# Patient Record
Sex: Female | Born: 1950 | Race: White | Hispanic: No | Smoking: Former smoker
Health system: Southern US, Community
[De-identification: ages and names within clinical notes are randomized; demographics above are authoritative.]

## PROBLEM LIST (undated history)

## (undated) DIAGNOSIS — D649 Anemia, unspecified: Secondary | ICD-10-CM

## (undated) DIAGNOSIS — I10 Essential (primary) hypertension: Secondary | ICD-10-CM

## (undated) DIAGNOSIS — K219 Gastro-esophageal reflux disease without esophagitis: Secondary | ICD-10-CM

## (undated) DIAGNOSIS — I2699 Other pulmonary embolism without acute cor pulmonale: Secondary | ICD-10-CM

## (undated) DIAGNOSIS — M1990 Unspecified osteoarthritis, unspecified site: Secondary | ICD-10-CM

## (undated) DIAGNOSIS — I509 Heart failure, unspecified: Secondary | ICD-10-CM

## (undated) DIAGNOSIS — J449 Chronic obstructive pulmonary disease, unspecified: Secondary | ICD-10-CM

## (undated) HISTORY — PX: EYE SURGERY: SHX253

## (undated) HISTORY — PX: APPENDECTOMY: SHX54

## (undated) HISTORY — PX: CHOLECYSTECTOMY: SHX55

---

## 2015-05-14 ENCOUNTER — Encounter (INDEPENDENT_AMBULATORY_CARE_PROVIDER_SITE_OTHER): Payer: Self-pay | Admitting: Ophthalmology

## 2015-05-31 ENCOUNTER — Encounter (INDEPENDENT_AMBULATORY_CARE_PROVIDER_SITE_OTHER): Payer: Self-pay | Admitting: Ophthalmology

## 2018-06-17 ENCOUNTER — Other Ambulatory Visit (HOSPITAL_COMMUNITY): Payer: Medicare Other

## 2018-06-17 ENCOUNTER — Inpatient Hospital Stay
Admission: AD | Admit: 2018-06-17 | Discharge: 2018-06-28 | Disposition: A | Discharge status: Other Acute Inpatient Hospital | Payer: Medicare Other | Source: Other Acute Inpatient Hospital | Attending: Internal Medicine | Admitting: Internal Medicine

## 2018-06-17 ENCOUNTER — Ambulatory Visit (HOSPITAL_COMMUNITY)
Admission: AD | Admit: 2018-06-17 | Discharge: 2018-06-17 | Disposition: A | Discharge status: Home / Self Care | Payer: Medicare Other | Source: Other Acute Inpatient Hospital | Attending: Internal Medicine | Admitting: Internal Medicine

## 2018-06-17 DIAGNOSIS — J9600 Acute respiratory failure, unspecified whether with hypoxia or hypercapnia: Secondary | ICD-10-CM

## 2018-06-17 DIAGNOSIS — D612 Aplastic anemia due to other external agents: Secondary | ICD-10-CM

## 2018-06-17 DIAGNOSIS — R6521 Severe sepsis with septic shock: Secondary | ICD-10-CM

## 2018-06-17 DIAGNOSIS — I509 Heart failure, unspecified: Secondary | ICD-10-CM

## 2018-06-17 DIAGNOSIS — R55 Syncope and collapse: Secondary | ICD-10-CM

## 2018-06-17 DIAGNOSIS — A419 Sepsis, unspecified organism: Secondary | ICD-10-CM

## 2018-06-17 DIAGNOSIS — Z4659 Encounter for fitting and adjustment of other gastrointestinal appliance and device: Secondary | ICD-10-CM

## 2018-06-17 DIAGNOSIS — J90 Pleural effusion, not elsewhere classified: Secondary | ICD-10-CM

## 2018-06-17 DIAGNOSIS — I480 Paroxysmal atrial fibrillation: Secondary | ICD-10-CM

## 2018-06-17 DIAGNOSIS — J9621 Acute and chronic respiratory failure with hypoxia: Secondary | ICD-10-CM

## 2018-06-17 DIAGNOSIS — J9690 Respiratory failure, unspecified, unspecified whether with hypoxia or hypercapnia: Secondary | ICD-10-CM | POA: Insufficient documentation

## 2018-06-17 DIAGNOSIS — Z9289 Personal history of other medical treatment: Secondary | ICD-10-CM

## 2018-06-17 LAB — BLOOD GAS, ARTERIAL
Acid-Base Excess: 5.1 mmol/L — ABNORMAL HIGH (ref 0.0–2.0)
Bicarbonate: 29.9 mmol/L — ABNORMAL HIGH (ref 20.0–28.0)
FIO2: 0.7
MECHVT: 450 mL
O2 Saturation: 95.6 %
PEEP: 5 cmH2O
Patient temperature: 98.9
RATE: 18 resp/min
pCO2 arterial: 51.3 mmHg — ABNORMAL HIGH (ref 32.0–48.0)
pH, Arterial: 7.385 (ref 7.350–7.450)
pO2, Arterial: 82.7 mmHg — ABNORMAL LOW (ref 83.0–108.0)

## 2018-06-17 LAB — VANCOMYCIN, TROUGH: Vancomycin Tr: 15 ug/mL (ref 15–20)

## 2018-06-18 DIAGNOSIS — D612 Aplastic anemia due to other external agents: Secondary | ICD-10-CM

## 2018-06-18 DIAGNOSIS — J9621 Acute and chronic respiratory failure with hypoxia: Secondary | ICD-10-CM

## 2018-06-18 DIAGNOSIS — A419 Sepsis, unspecified organism: Secondary | ICD-10-CM

## 2018-06-18 DIAGNOSIS — I480 Paroxysmal atrial fibrillation: Secondary | ICD-10-CM

## 2018-06-18 DIAGNOSIS — I509 Heart failure, unspecified: Secondary | ICD-10-CM | POA: Diagnosis not present

## 2018-06-18 DIAGNOSIS — R6521 Severe sepsis with septic shock: Secondary | ICD-10-CM

## 2018-06-18 LAB — CBC WITH DIFFERENTIAL/PLATELET
Abs Immature Granulocytes: 0.12 10*3/uL — ABNORMAL HIGH (ref 0.00–0.07)
Basophils Absolute: 0.1 10*3/uL (ref 0.0–0.1)
Basophils Relative: 1 %
EOS PCT: 2 %
Eosinophils Absolute: 0.3 10*3/uL (ref 0.0–0.5)
HCT: 38.7 % (ref 36.0–46.0)
Hemoglobin: 10.6 g/dL — ABNORMAL LOW (ref 12.0–15.0)
Immature Granulocytes: 1 %
Lymphocytes Relative: 5 %
Lymphs Abs: 0.6 10*3/uL — ABNORMAL LOW (ref 0.7–4.0)
MCH: 24.4 pg — ABNORMAL LOW (ref 26.0–34.0)
MCHC: 27.4 g/dL — AB (ref 30.0–36.0)
MCV: 89 fL (ref 80.0–100.0)
MONO ABS: 1.1 10*3/uL — AB (ref 0.1–1.0)
Monocytes Relative: 9 %
Neutro Abs: 9.8 10*3/uL — ABNORMAL HIGH (ref 1.7–7.7)
Neutrophils Relative %: 82 %
Platelets: 545 10*3/uL — ABNORMAL HIGH (ref 150–400)
RBC: 4.35 MIL/uL (ref 3.87–5.11)
RDW: 19.8 % — ABNORMAL HIGH (ref 11.5–15.5)
WBC: 12 10*3/uL — ABNORMAL HIGH (ref 4.0–10.5)
nRBC: 0 % (ref 0.0–0.2)

## 2018-06-18 LAB — COMPREHENSIVE METABOLIC PANEL
ALT: 11 U/L (ref 0–44)
AST: 12 U/L — ABNORMAL LOW (ref 15–41)
Albumin: 1.4 g/dL — ABNORMAL LOW (ref 3.5–5.0)
Alkaline Phosphatase: 277 U/L — ABNORMAL HIGH (ref 38–126)
Anion gap: 9 (ref 5–15)
BUN: 34 mg/dL — ABNORMAL HIGH (ref 8–23)
CO2: 29 mmol/L (ref 22–32)
CREATININE: 1.02 mg/dL — AB (ref 0.44–1.00)
Calcium: 8.7 mg/dL — ABNORMAL LOW (ref 8.9–10.3)
Chloride: 105 mmol/L (ref 98–111)
GFR calc Af Amer: >60 mL/min (ref >60)
GFR calc non Af Amer: 56 mL/min — ABNORMAL LOW (ref >60)
Glucose, Bld: 106 mg/dL — ABNORMAL HIGH (ref 70–99)
Potassium: 4.5 mmol/L (ref 3.5–5.1)
Sodium: 143 mmol/L (ref 135–145)
Total Bilirubin: 0.2 mg/dL — ABNORMAL LOW (ref 0.3–1.2)
Total Protein: 6.3 g/dL — ABNORMAL LOW (ref 6.5–8.1)

## 2018-06-18 LAB — HEMOGLOBIN A1C
Hgb A1c MFr Bld: 5.3 % (ref 4.8–5.6)
Mean Plasma Glucose: 105.41 mg/dL

## 2018-06-18 NOTE — Consult Note (Signed)
[facility]  Date of Service: 06/18/2018  PULMONARY CRITICAL CARE CONSULT   Kelsey Jensen  ZOX:[id number]  DOB: 01/14/1951   DOA: 06/17/2018  Referring Physician: Carron CurieAli Hijazi, MD  HPI: Kelsey Jensen is a 68 y.o. female seen for follow up of Acute on Chronic Respiratory Failure.  Patient is endotracheally intubated at the time that she was seen she is on propofol and fentanyl for sedation.  This unfortunate female has a history of hypertension congestive heart failure chronic aplastic anemia requiring multiple transfusions [facility] [place] [facility] and it was felt to be secondary to pneumonitis pneumonia.  Patient had been having some leg cramping and weakness along with shortness of breath.  Chest x-ray was done she was found to have a hemoglobin of 4.8 she was given 3 units of packed red cells and had worsening of her symptoms subsequently she was intubated.  On chest x-ray she was found to have bilateral pneumonia and also had a urinary tract infection with E. coli.  Patient was started on vancomycin as well as cefepime for broad-spectrum coverage.  The patient's subsequent hospital course is 1 of acute kidney injury seen by nephrology conservative management with some improvement.  Patient also have a drop in her blood pressure requiring Neo-Synephrine and that was felt to be secondary to the sepsis.  Right now as mentioned she is orally intubated and is on sedation  Past medical history Aplastic anemia Parental infusion dependent Iron deficiency Bronchitis Congestive heart failure Diverticulosis Hypertension Hypokalemia Morbid obesity Peptic ulcer disease GI bleed history  Surgical history: She has had left hip replaced.  Family history: Diabetes hypertension cataracts cancer glaucoma  Allergies Sulfa  Family history: Former smoker social alcohol use no drug  abuse  Medications: Reviewed on Rounds  Physical Exam:  Vitals: Temperature 99.0 pulse 89 respiratory 18 blood pressure 121/78 saturations 95%  Ventilator Settings mode of ventilation assist control FiO2 60% tidal volume 472 PEEP 5  . General: Comfortable at this time . Eyes: Grossly normal lids, irises & conjunctiva . ENT: grossly tongue is normal . Neck: no obvious mass . Cardiovascular: S1-S2 normal no gallop or rub is noted . Respiratory: Coarse breath sounds are noted bilaterally . Abdomen: Soft and nontender . Skin: no rash seen on limited exam . Musculoskeletal: not rigid . Psychiatric:unable to assess . Neurologic: no seizure no involuntary movements         Labs on Admission:  Basic Metabolic Panel: Recent Labs  Lab 06/18/18 0643  NA 143  K 4.5  CL 105  CO2 29  GLUCOSE 106*  BUN 34*  CREATININE 1.02*  CALCIUM 8.7*    Recent Labs  Lab 06/17/18 1630  PHART 7.385  PCO2ART 51.3*  PO2ART 82.7*  HCO3 29.9*  O2SAT 95.6    Liver Function Tests: Recent Labs  Lab 06/18/18 0643  AST 12*  ALT 11  ALKPHOS 277*  BILITOT 0.2*  PROT 6.3*  ALBUMIN 1.4*   No results for input(s): LIPASE, AMYLASE in the last 168 hours. No results for input(s): AMMONIA in the last 168 hours.  CBC: Recent Labs  Lab 06/18/18 0643  WBC 12.0*  NEUTROABS 9.8*  HGB 10.6*  HCT 38.7  MCV 89.0  PLT 545*    Cardiac Enzymes: No results for input(s): CKTOTAL, CKMB, CKMBINDEX, TROPONINI in the last 168 hours.  BNP (last 3 results) No results for input(s): BNP in the last 8760 hours.  ProBNP (last 3 results) No results for input(s): PROBNP in the last 8760 hours.   Radiological Exams on Admission: Dg Chest Port 1 View  Result Date: 06/17/2018 CLINICAL DATA:  ETT and NG tube placement EXAM: PORTABLE CHEST 1 VIEW COMPARISON:  None. FINDINGS: Endotracheal tube tip is about 5 cm superior to carina. Esophageal tube tip below diaphragm but non included. Left upper extremity  catheter tip over the SVC. Cardiomegaly with vascular congestion and mild pulmonary edema. Small moderate pleural effusions. Dense bibasilar airspace disease. No pneumothorax. IMPRESSION: 1. Endotracheal tube tip about 5 cm superior to carina 2. Cardiomegaly with vascular congestion, mild pulmonary edema, and small moderate pleural effusion. 3. Dense bibasilar airspace disease may reflect atelectasis or pneumonia Electronically Signed   By: Jasmine PangKim  Fujinaga M.D.   On: 06/17/2018 19:27   Dg Abd Portable 1v  Result Date: 06/17/2018 CLINICAL DATA:  NG tube EXAM: PORTABLE ABDOMEN - 1 VIEW COMPARISON:  None. FINDINGS: Esophageal tube tip overlies the gastroduodenal region. Overall gas pattern nonobstructed with mild to moderate stool. Scoliosis of the spine. Status post left hip replacement. IMPRESSION: Esophageal tube tip overlies the gastroduodenal junction Electronically Signed   By: Jasmine PangKim  Fujinaga M.D.   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 Acute on chronic respiratory failure with hypoxia at this time patient is on full vent support on assist control mode.  She is still requiring significant elevated FiO2 60%.  The patient's last chest x-ray did show cardiomegaly with some vascular congestion pulmonary edema and small moderate pleural effusion suggestive more of congestive heart failure not an acute infection at this point.  I would try to diuresis tolerated BUN/creatinine are adequate at this time. 2. Sepsis patient was on vancomycin as well as cefepime.  This will be continued.  As far as her blood pressure is concerned she had been on Neo-Synephrine this is being titrated.  Need to monitor her per critical care protocol. 3. Congestive heart  failure chest x-ray actually revealing more consistent with CHF she is been on Lasix and she is getting IV 40 mg which should be continued. 4. Chronic anemia need to monitor her hemoglobin closely avoid excessive venipunctures. 5. Paroxysmal atrial fibrillation rate controlled at this time we will continue with metoprolol as ordered and is off anticoagulation secondary to her hemoglobin and history of GI bleed as per the request of POA.  I have personally seen and evaluated the patient, evaluated laboratory and imaging results, formulated the assessment and plan and placed orders.  Patient is critically ill in danger of cardiac arrest and death she has multiorgan system involvement and needs close monitoring.  In addition she has a unstable airway and is in intubated orally The Patient requires high complexity decision making for assessment and support.  Case was discussed on Rounds with the Respiratory Therapy Staff Time Spent 70minutes  Yevonne PaxSaadat A , MD [facility] Pulmonary Critical Care Medicine Sleep Medicine

## 2018-06-19 DIAGNOSIS — I509 Heart failure, unspecified: Secondary | ICD-10-CM | POA: Diagnosis not present

## 2018-06-19 DIAGNOSIS — I480 Paroxysmal atrial fibrillation: Secondary | ICD-10-CM | POA: Diagnosis not present

## 2018-06-19 DIAGNOSIS — D612 Aplastic anemia due to other external agents: Secondary | ICD-10-CM | POA: Diagnosis not present

## 2018-06-19 DIAGNOSIS — J9621 Acute and chronic respiratory failure with hypoxia: Secondary | ICD-10-CM | POA: Diagnosis not present

## 2018-06-19 NOTE — Progress Notes (Signed)
[facility]   PULMONARY CRITICAL CARE SERVICE  PROGRESS NOTE  Date of Service: 06/19/2018  Kelsey AbbeBrenda Jensen  JYN:[id number]  DOB: February 04, 1951   DOA: 06/17/2018  Referring Physician: Carron CurieAli Hijazi, MD  HPI: Kelsey Jensen is a 68 y.o. female seen for follow up of Acute on Chronic Respiratory Failure.  Patient is critically ill on multiple drips right now requiring sedation fentanyl propofol.  Patient also has been requiring Neo-Synephrine for blood pressure remains on the ventilator right now is on 60% oxygen with a PEEP of 5.  Endotracheal tube is in place  Medications: Reviewed on Rounds  Physical Exam:  Vitals: Temperature 97.8 pulse 118 respiratory 26 blood pressure 123/68 saturations 97%  Ventilator Settings mode of ventilation assist control FiO2 60% tidal volume 473 PEEP 5  . General: Comfortable at this time . Eyes: Grossly normal lids, irises & conjunctiva . ENT: grossly tongue is normal . Neck: no obvious mass . Cardiovascular: S1 S2 normal no gallop . Respiratory: Coarse breath sounds with a few rhonchi . Abdomen: soft . Skin: no rash seen on limited exam . Musculoskeletal: not rigid . Psychiatric:unable to assess . Neurologic: no seizure no involuntary movements         Lab Data:   Basic Metabolic Panel: Recent Labs  Lab 06/18/18 0643  NA 143  K 4.5  CL 105  CO2 29  GLUCOSE 106*  BUN 34*  CREATININE 1.02*  CALCIUM 8.7*    ABG: Recent Labs  Lab 06/17/18 1630  PHART 7.385  PCO2ART 51.3*  PO2ART 82.7*  HCO3 29.9*  O2SAT 95.6    Liver Function Tests: Recent Labs  Lab 06/18/18 0643  AST 12*  ALT 11  ALKPHOS 277*  BILITOT 0.2*  PROT 6.3*  ALBUMIN 1.4*   No results for input(s): LIPASE, AMYLASE in the last 168 hours. No results for input(s): AMMONIA in the last 168 hours.  CBC: Recent Labs  Lab 06/18/18 0643  WBC 12.0*  NEUTROABS 9.8*  HGB 10.6*  HCT 38.7  MCV 89.0  PLT 545*     Cardiac Enzymes: No results for input(s): CKTOTAL, CKMB, CKMBINDEX, TROPONINI in the last 168 hours.  BNP (last 3 results) No results for input(s): BNP in the last 8760 hours.  ProBNP (last 3 results) No results for input(s): PROBNP in the last 8760 hours.  Radiological Exams: Dg Chest Port 1 View  Result Date: 06/17/2018 CLINICAL DATA:  ETT and NG tube placement EXAM: PORTABLE CHEST 1 VIEW COMPARISON:  None. FINDINGS: Endotracheal tube tip is about 5 cm superior to carina. Esophageal tube tip below diaphragm but non included. Left upper extremity catheter tip over the SVC. Cardiomegaly with vascular congestion and mild pulmonary edema. Small moderate pleural effusions. Dense bibasilar airspace disease. No pneumothorax. IMPRESSION: 1. Endotracheal tube tip about 5 cm superior to carina 2. Cardiomegaly with vascular congestion, mild pulmonary edema, and small moderate pleural effusion. 3. Dense bibasilar airspace disease may reflect atelectasis or pneumonia Electronically Signed   By: Kelsey PangKim  Jensen M.D.   On: 06/17/2018 19:27   Dg Abd Portable 1v  Result Date: 06/17/2018 CLINICAL DATA:  NG tube EXAM: PORTABLE ABDOMEN - 1 VIEW COMPARISON:  None. FINDINGS: Esophageal tube tip overlies the gastroduodenal region. Overall gas pattern nonobstructed with mild to moderate stool. Scoliosis of the spine. Status post left hip replacement. IMPRESSION: Esophageal tube tip overlies the gastroduodenal junction Electronically Signed   By: Kelsey PangKim  Jensen M.D.   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 Acute on chronic respiratory failure with hypoxia patient will be continued on full support on assist control mode.  Right now patient is on 60%  FiO2 chest x-ray was reviewed and it showed cardiomegaly with vascular congestion and congestion patient has significant cardiomegaly noted also. 2. Severe sepsis with shock patient is requiring pressors was on Neo-Synephrine will titrate down as tolerated. 3. Chronic congestive heart failure chest x-ray shows severe dilatation of the cardiac silhouette.  Last echocardiogram that I am able to locate was in 2014 I would recommend doing a follow-up echocardiogram to reassess this may have an effect on the patient's prognosis 4. Aplastic anemia we will continue with supportive care has been requiring transfusions 5. Chronic atrial fibrillation paroxysmal right now rate is controlled we will continue to follow   I have personally seen and evaluated the patient, evaluated laboratory and imaging results, formulated the assessment and plan and placed orders.  Patient is critically ill in danger of cardiac arrest and death has a high risk airway with the ET tube and requiring ongoing sedation time 35 minutes critical care The Patient requires high complexity decision making for assessment and support.  Case was discussed on Rounds with the Respiratory Therapy Staff  Yevonne Pax, MD [facility] Pulmonary Critical Care Medicine Sleep Medicine

## 2018-06-20 DIAGNOSIS — I509 Heart failure, unspecified: Secondary | ICD-10-CM | POA: Diagnosis not present

## 2018-06-20 DIAGNOSIS — D612 Aplastic anemia due to other external agents: Secondary | ICD-10-CM | POA: Diagnosis not present

## 2018-06-20 DIAGNOSIS — J9621 Acute and chronic respiratory failure with hypoxia: Secondary | ICD-10-CM | POA: Diagnosis not present

## 2018-06-20 DIAGNOSIS — I480 Paroxysmal atrial fibrillation: Secondary | ICD-10-CM | POA: Diagnosis not present

## 2018-06-20 LAB — VANCOMYCIN, TROUGH: Vancomycin Tr: 12 ug/mL — ABNORMAL LOW (ref 15–20)

## 2018-06-20 NOTE — Progress Notes (Signed)
Pulmonary Critical Care Medicine [facility] [place]   PULMONARY CRITICAL CARE SERVICE  PROGRESS NOTE  Date of Service: 06/20/2018  Kelsey Jensen  [id number]  DOB: 02-21-1951   DOA: 06/17/2018  Referring Physician: Carron Curie, MD  HPI: Kelsey Jensen is a 68 y.o. female seen for follow up of Acute on Chronic Respiratory Failure.  Patient remains critically ill she is orally intubated is been on full support on assist control mode currently is requiring 60% oxygen also is requiring sedation has been on Versed which has been adequate to achieve control.  Medications: Reviewed on Rounds  Physical Exam:  Vitals: Temperature 98.8 pulse 96 respiratory 22 blood pressure 127/74 saturations 95%  Ventilator Settings mode ventilation assist control FiO2 60% tidal volume 547 PEEP 6  . General: Comfortable at this time . Eyes: Grossly normal lids, irises & conjunctiva . ENT: grossly tongue is normal . Neck: no obvious mass . Cardiovascular: S1 S2 normal no gallop . Respiratory: Coarse breath sounds are noted bilaterally scattered rhonchi . Abdomen: soft . Skin: no rash seen on limited exam . Musculoskeletal: not rigid . Psychiatric:unable to assess . Neurologic: no seizure no involuntary movements         Lab Data:   Basic Metabolic Panel: Recent Labs  Lab 06/18/18 0643  NA 143  K 4.5  CL 105  CO2 29  GLUCOSE 106*  BUN 34*  CREATININE 1.02*  CALCIUM 8.7*    ABG: Recent Labs  Lab 06/17/18 1630  PHART 7.385  PCO2ART 51.3*  PO2ART 82.7*  HCO3 29.9*  O2SAT 95.6    Liver Function Tests: Recent Labs  Lab 06/18/18 0643  AST 12*  ALT 11  ALKPHOS 277*  BILITOT 0.2*  PROT 6.3*  ALBUMIN 1.4*   No results for input(s): LIPASE, AMYLASE in the last 168 hours. No results for input(s): AMMONIA in the last 168 hours.  CBC: Recent Labs  Lab 06/18/18 0643  WBC 12.0*  NEUTROABS 9.8*  HGB 10.6*  HCT 38.7  MCV 89.0  PLT 545*    Cardiac Enzymes: No  results for input(s): CKTOTAL, CKMB, CKMBINDEX, TROPONINI in the last 168 hours.  BNP (last 3 results) No results for input(s): BNP in the last 8760 hours.  ProBNP (last 3 results) No results for input(s): PROBNP in the last 8760 hours.  Radiological Exams: No results found.  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 Acute on chronic respiratory failure with hypoxia patient is critically ill remains on full vent support still requiring high FiO2 of 60%.  I would suggest getting a follow-up ABG to assess oxygen needs to titrate down if possible.  Right now patient is getting a tidal volume of 547 PEEP 6 and is comfortable on the settings.  The ET tube is in good place 2. Severe sepsis with shock hemodynamically stable at this time we will continue with present management blood pressure looks adequate. 3. Chronic congestive heart failure diuretics as tolerated continue with supportive care with patient's labs will be followed.  Echocardiogram follow-up was suggested 4. Aplastic anemia monitor hemoglobin has been requiring transfusions 5. Chronic atrial fibrillation rate is controlled   I have personally seen and evaluated the patient, evaluated laboratory and imaging results, formulated the assessment and plan and  placed orders.  Patient is critically ill in danger of cardiac arrest and death time 35 minutes.  Patient is orally intubated and has a high risk airway case discussed on rounds with treatment team and primary care team The Patient requires high complexity decision making for assessment and support.  Case was discussed on Rounds with the Respiratory Therapy Staff  Yevonne Pax, MD [facility] Pulmonary Critical Care Medicine Sleep Medicine

## 2018-06-21 DIAGNOSIS — Z9289 Personal history of other medical treatment: Secondary | ICD-10-CM

## 2018-06-21 DIAGNOSIS — D612 Aplastic anemia due to other external agents: Secondary | ICD-10-CM | POA: Diagnosis not present

## 2018-06-21 DIAGNOSIS — I480 Paroxysmal atrial fibrillation: Secondary | ICD-10-CM | POA: Diagnosis not present

## 2018-06-21 DIAGNOSIS — J9621 Acute and chronic respiratory failure with hypoxia: Secondary | ICD-10-CM | POA: Diagnosis not present

## 2018-06-21 DIAGNOSIS — I509 Heart failure, unspecified: Secondary | ICD-10-CM | POA: Diagnosis not present

## 2018-06-21 LAB — CBC
HCT: 39 % (ref 36.0–46.0)
Hemoglobin: 10.8 g/dL — ABNORMAL LOW (ref 12.0–15.0)
MCH: 24.7 pg — ABNORMAL LOW (ref 26.0–34.0)
MCHC: 27.7 g/dL — ABNORMAL LOW (ref 30.0–36.0)
MCV: 89 fL (ref 80.0–100.0)
Platelets: 710 10*3/uL — ABNORMAL HIGH (ref 150–400)
RBC: 4.38 MIL/uL (ref 3.87–5.11)
RDW: 19.9 % — ABNORMAL HIGH (ref 11.5–15.5)
WBC: 12.2 10*3/uL — ABNORMAL HIGH (ref 4.0–10.5)
nRBC: 0 % (ref 0.0–0.2)

## 2018-06-21 LAB — BASIC METABOLIC PANEL
Anion gap: 11 (ref 5–15)
BUN: 32 mg/dL — ABNORMAL HIGH (ref 8–23)
CO2: 28 mmol/L (ref 22–32)
Calcium: 8.8 mg/dL — ABNORMAL LOW (ref 8.9–10.3)
Chloride: 103 mmol/L (ref 98–111)
Creatinine, Ser: 0.81 mg/dL (ref 0.44–1.00)
GFR calc Af Amer: >60 mL/min (ref >60)
GFR calc non Af Amer: >60 mL/min (ref >60)
Glucose, Bld: 136 mg/dL — ABNORMAL HIGH (ref 70–99)
Potassium: 4.3 mmol/L (ref 3.5–5.1)
Sodium: 142 mmol/L (ref 135–145)

## 2018-06-21 LAB — MAGNESIUM: Magnesium: 2.3 mg/dL (ref 1.7–2.4)

## 2018-06-21 LAB — URIC ACID: Uric Acid, Serum: 2.8 mg/dL (ref 2.5–7.1)

## 2018-06-21 LAB — CK: Total CK: 16 U/L — ABNORMAL LOW (ref 38–234)

## 2018-06-21 NOTE — Progress Notes (Signed)
Pulmonary Critical Care Medicine [facility] [place]   PULMONARY CRITICAL CARE SERVICE  PROGRESS NOTE  Date of Service: 06/21/2018  Kelsey Jensen  [id number]  DOB: 08/25/1950   DOA: 06/17/2018  Referring Physician: Carron Curie, MD  HPI: Kelsey Jensen is a 68 y.o. female seen for follow up of Acute on Chronic Respiratory Failure.  Patient remains on full support right now is on assist control mode with 60% FiO2 the patient is orally intubated and also is requiring sedation in the form of fentanyl and Versed  Medications: Reviewed on Rounds  Physical Exam:  Vitals: Temperature 98.2 pulse 99 respiratory rate 20 blood pressure 112/56 saturations 96%  Ventilator Settings mode ventilation assist control FiO2 60% PEEP 8 tidal volume 450  . General: Comfortable at this time . Eyes: Grossly normal lids, irises & conjunctiva . ENT: grossly tongue is normal . Neck: no obvious mass . Cardiovascular: S1 S2 normal no gallop . Respiratory: No rhonchi or rales are noted at this time . Abdomen: soft . Skin: no rash seen on limited exam . Musculoskeletal: not rigid . Psychiatric:unable to assess . Neurologic: no seizure no involuntary movements         Lab Data:   Basic Metabolic Panel: Recent Labs  Lab 06/18/18 0643 06/21/18 1231  NA 143 142  K 4.5 4.3  CL 105 103  CO2 29 28  GLUCOSE 106* 136*  BUN 34* 32*  CREATININE 1.02* 0.81  CALCIUM 8.7* 8.8*  MG  --  2.3    ABG: Recent Labs  Lab 06/17/18 1630  PHART 7.385  PCO2ART 51.3*  PO2ART 82.7*  HCO3 29.9*  O2SAT 95.6    Liver Function Tests: Recent Labs  Lab 06/18/18 0643  AST 12*  ALT 11  ALKPHOS 277*  BILITOT 0.2*  PROT 6.3*  ALBUMIN 1.4*   No results for input(s): LIPASE, AMYLASE in the last 168 hours. No results for input(s): AMMONIA in the last 168 hours.  CBC: Recent Labs  Lab 06/18/18 0643 06/21/18 1231  WBC 12.0* 12.2*  NEUTROABS 9.8*  --   HGB 10.6* 10.8*  HCT 38.7 39.0  MCV 89.0  89.0  PLT 545* 710*    Cardiac Enzymes: Recent Labs  Lab 06/21/18 1231  CKTOTAL 16*    BNP (last 3 results) No results for input(s): BNP in the last 8760 hours.  ProBNP (last 3 results) No results for input(s): PROBNP in the last 8760 hours.  Radiological Exams: No results found.  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 Acute on chronic respiratory failure with hypoxia patient is orally intubated tube is in good place patient is not tolerating the RSB I has required ongoing sedation for airway protection patient will have an ENT consultation for surgery for tracheostomy. 2. Severe sepsis with shock hemodynamically doing better continue to monitor pressures 3. Chronic congestive heart failure follow-up x-ray as needed 4. Aplastic anemia transfuse as needed hemoglobin seems to be stable 5. Paroxysmal atrial fibrillation rate is controlled we will continue with present management   I have personally seen and evaluated the patient, evaluated laboratory and imaging results, formulated the assessment and plan and placed orders.  Time 35 minutes patient is critically ill in danger of cardiac arrest and death.  Patient  has high risk airway orally intubated is on sedation with Versed drip and fentanyl drip The Patient requires high complexity decision making for assessment and support.  Case was discussed on Rounds with the Respiratory Therapy Staff  Yevonne Pax, MD [facility] Pulmonary Critical Care Medicine Sleep Medicine

## 2018-06-22 ENCOUNTER — Other Ambulatory Visit (HOSPITAL_COMMUNITY): Payer: Medicare Other

## 2018-06-22 DIAGNOSIS — D612 Aplastic anemia due to other external agents: Secondary | ICD-10-CM | POA: Diagnosis not present

## 2018-06-22 DIAGNOSIS — I509 Heart failure, unspecified: Secondary | ICD-10-CM | POA: Diagnosis not present

## 2018-06-22 DIAGNOSIS — J9621 Acute and chronic respiratory failure with hypoxia: Secondary | ICD-10-CM | POA: Diagnosis not present

## 2018-06-22 DIAGNOSIS — I480 Paroxysmal atrial fibrillation: Secondary | ICD-10-CM | POA: Diagnosis not present

## 2018-06-22 LAB — BLOOD GAS, ARTERIAL
Acid-Base Excess: 7.8 mmol/L — ABNORMAL HIGH (ref 0.0–2.0)
Bicarbonate: 31.6 mmol/L — ABNORMAL HIGH (ref 20.0–28.0)
FIO2: 100
O2 SAT: 89.6 %
PEEP: 10 cmH2O
Patient temperature: 98.6
RATE: 18 resp/min
VT: 450 mL
pCO2 arterial: 43.1 mmHg (ref 32.0–48.0)
pH, Arterial: 7.479 — ABNORMAL HIGH (ref 7.350–7.450)
pO2, Arterial: 57.6 mmHg — ABNORMAL LOW (ref 83.0–108.0)

## 2018-06-22 NOTE — Progress Notes (Signed)
Pulmonary Critical Care Medicine [facility] [place]   PULMONARY CRITICAL CARE SERVICE  PROGRESS NOTE  Date of Service: 06/22/2018  Kelsey Jensen  WIO:[id number]  DOB: May 29, 1951   DOA: 06/17/2018  Referring Physician: Carron Curie, MD  HPI: Kelsey Jensen is a 68 y.o. female seen for follow up of Acute on Chronic Respiratory Failure.  Patient had an acute decline in status she is have to be increased to 100% FiO2 PEEP was increased to 10.  Chest x-ray was done which basically shows a white out of the left side.  She is orally intubated on the ventilator.  Medications: Reviewed on Rounds  Physical Exam:  Vitals: Temperature 97.0 pulse 118 respiratory rate 26 blood pressure 141/73 saturations 96%  Ventilator Settings mode ventilation assist control FiO2 100% tidal volume 480 PEEP of 10  . General: Comfortable at this time . Eyes: Grossly normal lids, irises & conjunctiva . ENT: grossly tongue is normal . Neck: no obvious mass . Cardiovascular: S1 S2 normal no gallop . Respiratory: Coarse breath sounds diminished on the left . Abdomen: soft . Skin: no rash seen on limited exam . Musculoskeletal: not rigid . Psychiatric:unable to assess . Neurologic: no seizure no involuntary movements         Lab Data:   Basic Metabolic Panel: Recent Labs  Lab 06/18/18 0643 06/21/18 1231  NA 143 142  K 4.5 4.3  CL 105 103  CO2 29 28  GLUCOSE 106* 136*  BUN 34* 32*  CREATININE 1.02* 0.81  CALCIUM 8.7* 8.8*  MG  --  2.3    ABG: Recent Labs  Lab 06/17/18 1630 06/22/18 0845  PHART 7.385 7.479*  PCO2ART 51.3* 43.1  PO2ART 82.7* 57.6*  HCO3 29.9* 31.6*  O2SAT 95.6 89.6    Liver Function Tests: Recent Labs  Lab 06/18/18 0643  AST 12*  ALT 11  ALKPHOS 277*  BILITOT 0.2*  PROT 6.3*  ALBUMIN 1.4*   No results for input(s): LIPASE, AMYLASE in the last 168 hours. No results for input(s): AMMONIA in the last 168 hours.  CBC: Recent Labs  Lab 06/18/18 0643  06/21/18 1231  WBC 12.0* 12.2*  NEUTROABS 9.8*  --   HGB 10.6* 10.8*  HCT 38.7 39.0  MCV 89.0 89.0  PLT 545* 710*    Cardiac Enzymes: Recent Labs  Lab 06/21/18 1231  CKTOTAL 16*    BNP (last 3 results) No results for input(s): BNP in the last 8760 hours.  ProBNP (last 3 results) No results for input(s): PROBNP in the last 8760 hours.  Radiological Exams: Dg Chest Port 1 View  Result Date: 06/22/2018 CLINICAL DATA:  Pleural effusion. Acute on chronic respiratory failure. EXAM: PORTABLE CHEST 1 VIEW COMPARISON:  06/17/2018 FINDINGS: Endotracheal tube is in good position. PICC tip is in good position, unchanged. Feeding tube tip is below the diaphragm. The patient has developed complete opacification of the left hemithorax with some air bronchograms, probably representing a combination of lung consolidation and effusion. Hazy density at the right lung base probably represents a combination of infiltrate and effusion. The aeration at the right base has slightly improved. Pulmonary vascularity is normal. No acute bone abnormality. IMPRESSION: 1. Interval complete opacification of the left hemithorax probably due to a combination of lung consolidation and effusion. 2. Slightly improved aeration at the right lung base with probable atelectasis and a small right effusion. Electronically Signed   By: Francene Boyers M.D.   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 Acute on chronic respiratory failure with hypoxia patient had acute decline in her oxygen requirements with FiO2 of 100% now.  Chest x-ray shows whiteout of the left side likely representing atelectasis based on the findings of the chest film.  Spoke with respiratory therapy  during rounds we are going to try bagging and lavaging her.  Also will try Mucomyst and also try chest PT if this is able to expand the lungs then will continue with Mucomyst for at least 3 days.  If her lung is still dilated she might need to have a bronchoscopy for further airway evaluation. 2. Severe sepsis with shock resolved hemodynamically stable. 3. Chronic congestive heart failure she is on diuretics 4. Aplastic anemia at baseline 5. Chronic atrial fibrillation rate is controlled at this time we will continue to monitor   I have personally seen and evaluated the patient, evaluated laboratory and imaging results, formulated the assessment and plan and placed orders.  Patient is critically ill in danger of cardiac arrest and death she has a high risk airway orally intubated time 35 minutes critical care The Patient requires high complexity decision making for assessment and support.  Case was discussed on Rounds with the Respiratory Therapy Staff  Yevonne Pax, MD [facility] Pulmonary Critical Care Medicine Sleep Medicine

## 2018-06-23 ENCOUNTER — Other Ambulatory Visit (HOSPITAL_COMMUNITY): Payer: Medicare Other

## 2018-06-23 DIAGNOSIS — J9811 Atelectasis: Secondary | ICD-10-CM | POA: Diagnosis not present

## 2018-06-23 DIAGNOSIS — A419 Sepsis, unspecified organism: Secondary | ICD-10-CM

## 2018-06-23 DIAGNOSIS — R6521 Severe sepsis with septic shock: Secondary | ICD-10-CM

## 2018-06-23 DIAGNOSIS — D612 Aplastic anemia due to other external agents: Secondary | ICD-10-CM

## 2018-06-23 DIAGNOSIS — I509 Heart failure, unspecified: Secondary | ICD-10-CM | POA: Diagnosis not present

## 2018-06-23 DIAGNOSIS — I480 Paroxysmal atrial fibrillation: Secondary | ICD-10-CM

## 2018-06-23 DIAGNOSIS — J9621 Acute and chronic respiratory failure with hypoxia: Secondary | ICD-10-CM

## 2018-06-23 LAB — CBC
HCT: 33.5 % — ABNORMAL LOW (ref 36.0–46.0)
Hemoglobin: 9.6 g/dL — ABNORMAL LOW (ref 12.0–15.0)
MCH: 25.2 pg — ABNORMAL LOW (ref 26.0–34.0)
MCHC: 28.7 g/dL — ABNORMAL LOW (ref 30.0–36.0)
MCV: 87.9 fL (ref 80.0–100.0)
NRBC: 0 % (ref 0.0–0.2)
Platelets: 781 10*3/uL — ABNORMAL HIGH (ref 150–400)
RBC: 3.81 MIL/uL — ABNORMAL LOW (ref 3.87–5.11)
RDW: 20 % — ABNORMAL HIGH (ref 11.5–15.5)
WBC: 19.8 10*3/uL — AB (ref 4.0–10.5)

## 2018-06-23 LAB — BASIC METABOLIC PANEL
Anion gap: 10 (ref 5–15)
BUN: 43 mg/dL — ABNORMAL HIGH (ref 8–23)
CO2: 30 mmol/L (ref 22–32)
Calcium: 8.6 mg/dL — ABNORMAL LOW (ref 8.9–10.3)
Chloride: 101 mmol/L (ref 98–111)
Creatinine, Ser: 0.93 mg/dL (ref 0.44–1.00)
GFR calc Af Amer: >60 mL/min (ref >60)
GFR calc non Af Amer: >60 mL/min (ref >60)
Glucose, Bld: 170 mg/dL — ABNORMAL HIGH (ref 70–99)
Potassium: 4 mmol/L (ref 3.5–5.1)
Sodium: 141 mmol/L (ref 135–145)

## 2018-06-23 NOTE — Progress Notes (Signed)
[facility] [place]   PULMONARY CRITICAL CARE SERVICE  PROGRESS NOTE  Date of Service: 06/23/2018  Kelsey Jensen  MWU:[id number]  DOB: 04-11-1951   DOA: 06/17/2018  Referring Physician: Carron CurieAli Hijazi, MD  HPI: Kelsey Jensen is a 68 y.o. female seen for follow up of Acute on Chronic Respiratory Failure.  She continues to be on high FiO2 requirements has been on 55% FiO2 which is an improvement from yesterday she had been on 100%.  Chest x-ray shows some improvement of aeration also.  Still has lower lobe collapse and she will need further evaluation of her airway.  She remains orally intubated  Medications: Reviewed on Rounds  Physical Exam:  Vitals: Temperature 97.4 pulse 95 respiratory 21 blood pressure 125/88 saturations 96%  Ventilator Settings mode ventilation assist control FiO2 55% tidal volume 470 PEEP 10  . General: Comfortable at this time . Eyes: Grossly normal lids, irises & conjunctiva . ENT: grossly tongue is normal . Neck: no obvious mass . Cardiovascular: S1 S2 normal no gallop . Respiratory: Coarse rhonchi expansion is equal . Abdomen: soft . Skin: no rash seen on limited exam . Musculoskeletal: not rigid . Psychiatric:unable to assess . Neurologic: no seizure no involuntary movements         Lab Data:   Basic Metabolic Panel: Recent Labs  Lab 06/18/18 0643 06/21/18 1231 06/23/18 0712  NA 143 142 141  K 4.5 4.3 4.0  CL 105 103 101  CO2 29 28 30   GLUCOSE 106* 136* 170*  BUN 34* 32* 43*  CREATININE 1.02* 0.81 0.93  CALCIUM 8.7* 8.8* 8.6*  MG  --  2.3  --     ABG: Recent Labs  Lab 06/17/18 1630 06/22/18 0845  PHART 7.385 7.479*  PCO2ART 51.3* 43.1  PO2ART 82.7* 57.6*  HCO3 29.9* 31.6*  O2SAT 95.6 89.6    Liver Function Tests: Recent Labs  Lab 06/18/18 0643  AST 12*  ALT 11  ALKPHOS 277*  BILITOT 0.2*  PROT 6.3*  ALBUMIN 1.4*   No results for input(s): LIPASE, AMYLASE in the last 168  hours. No results for input(s): AMMONIA in the last 168 hours.  CBC: Recent Labs  Lab 06/18/18 0643 06/21/18 1231 06/23/18 0712  WBC 12.0* 12.2* 19.8*  NEUTROABS 9.8*  --   --   HGB 10.6* 10.8* 9.6*  HCT 38.7 39.0 33.5*  MCV 89.0 89.0 87.9  PLT 545* 710* 781*    Cardiac Enzymes: Recent Labs  Lab 06/21/18 1231  CKTOTAL 16*    BNP (last 3 results) No results for input(s): BNP in the last 8760 hours.  ProBNP (last 3 results) No results for input(s): PROBNP in the last 8760 hours.  Radiological Exams: Dg Chest Port 1 View  Result Date: 06/23/2018 CLINICAL DATA:  Hypoxia EXAM: PORTABLE CHEST 1 VIEW COMPARISON:  April 23, 2019 FINDINGS: Endotracheal tube tip is 4.6 cm above the carina. Feeding tube tip is below the diaphragm. Central catheter tip is in the superior vena cava near the cavoatrial junction. No pneumothorax. Large pleural effusion noted 1 day prior is no longer evident. There are small pleural effusions bilaterally. There is airspace consolidation in the lower lobes bilaterally as well as to a lesser extent in each upper lobe. There is cardiomegaly with pulmonary venous hypertension. No adenopathy appreciable. No bone lesions. IMPRESSION: Tube and catheter positions as described without pneumothorax. Multifocal opacity, felt to represent multifocal pneumonia. A degree of superimposed alveolar edema is possible. There are  small pleural effusions bilaterally. Note that the large pleural effusion on the left is much smaller currently. There is pulmonary vascular congestion. Electronically Signed   By: Bretta BangWilliam  Woodruff III M.D.   On: 06/23/2018 07:28   Dg Chest Port 1 View  Result Date: 06/22/2018 CLINICAL DATA:  Pleural effusion. Acute on chronic respiratory failure. EXAM: PORTABLE CHEST 1 VIEW COMPARISON:  06/17/2018 FINDINGS: Endotracheal tube is in good position. PICC tip is in good position, unchanged. Feeding tube tip is below the diaphragm. The patient has developed  complete opacification of the left hemithorax with some air bronchograms, probably representing a combination of lung consolidation and effusion. Hazy density at the right lung base probably represents a combination of infiltrate and effusion. The aeration at the right base has slightly improved. Pulmonary vascularity is normal. No acute bone abnormality. IMPRESSION: 1. Interval complete opacification of the left hemithorax probably due to a combination of lung consolidation and effusion. 2. Slightly improved aeration at the right lung base with probable atelectasis and a small right effusion. Electronically Signed   By: Francene BoyersJames  Maxwell M.D.   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 Acute on chronic respiratory failure with hypoxia her oxygen requirements have improved somewhat however she still has a collapse of lower lobe on the chest film.  I am going to have her undergo an airway evaluation.  She needs to continue with the Mucomyst also.  She remains on sedation at this time Versed and fentanyl 2. Severe sepsis hemodynamically stable right now continue with supportive care 3. Chronic congestive heart failure at baseline we will continue with supportive care 4. Aplastic anemia monitor labs 5. Paroxysmal atrial fibrillation we will continue with rate control   I have personally seen and evaluated the patient, evaluated laboratory and imaging results, formulated the assessment and plan and placed orders.  Time 35 minutes patient is critically ill she has an oral airway and is high risk for dislodgment The Patient requires high complexity decision making for assessment and support.  Case was discussed on Rounds with the Respiratory Therapy  Staff  Yevonne PaxSaadat A Khan, MD [facility] Pulmonary Critical Care Medicine Sleep Medicine

## 2018-06-23 NOTE — Procedures (Signed)
Date: 06/23/2018,  MRN# [id number]    Procedure Note: Fiberoptic Bronchoscopy   PROCEDURE DATE: 06/23/2018     NAME:  Kelsey Jensen   DOB:[birth year]   MRN: [id number] LOC:  5E10C/5E10C-01      Indications/Preliminary Diagnosis: Atelectasis of the left lung  Consent: (Place X beside choice/s below)  The benefits, risks and possible complications of the procedure were        explained to:  ___ patient  __X_ patient's family  ___ other:___________  who verbalized understanding and gave:  ___ verbal  __X_ written  ___ verbal and written  ___ telephone  ___ other:________ consent.      Unable to obtain consent; procedure performed on emergent basis.     Other:      PRESEDATION ASSESSMENT: History and Physical has been performed. Patient meds and allergies have been reviewed. Presedation airway examination has been performed and documented. Baseline vital signs, sedation score, oxygenation status, and cardiac rhythm were reviewed. Patient was deemed to be in satisfactory condition to undergo the procedure.  PREMEDICATIONS:   Sedative/Narcotic Amt Dose   Versed  5 mg   Fentanyl  100 mcg  Diprivan  mg     Insertion Route (Place X beside choice below)   Nasal   Oral  X Endotracheal Tube   Tracheostomy   INTRAPROCEDURE MEDICATIONS:  Sedative/Narcotic Amt Dose   Versed  mg   Fentanyl  mcg  Diprivan  mg       Medication Amt Dose  Medication Amt Dose  Xylocaine 2%  cc  Epinephrine 1:10,000 sol  cc  Xylocaine 4%  cc  Cocaine  cc   TECHNICAL PROCEDURES: (Place X beside choice below)   Procedures  Description  X  None     Electrocautery     Cryotherapy     Balloon Dilatation     Bronchography     Stent Placement     Therapeutic Aspiration     Laser/Argon Plasma            SPECIMENS (Sites): (Place X beside choice below)  Specimens Description   No Specimens Obtained     Washings   X Lavage  left upper lobe left lower lobe   Biopsies    Fine Needle Aspirates    Brushings    Sputum    FINDINGS:  ESTIMATED BLOOD LOSS: none  COMPLICATIONS/RESOLUTION: none  PROCEDURE DETAILS: Timeout performed and correct patient, name, & ID confirmed. Following prep per Pulmonary policy, appropriate sedation was administered.  Airway exam proceeded with findings, technical procedures, and specimen collection as noted below. At the end of exam the scope was withdrawn without incident. Impression and Plan as noted below.    Procedure Note: Patient was adequately sedated the fiberoptic scope was inserted through the endotracheal tube down to the carina.  The carina was found to be nice and sharp.  First the right lung was examined which was found to be free of any endobronchial disease.  Next the scope was withdrawn back to the carina and then the left lung was examined.  Patient had mucous plugging noted which was emanating from the lower lobe.  The left upper lobe and lingula appeared to be fairly clear with some minimal secretions noted.  Bronchoalveolar lavage was performed from the left lower lobe and left upper lobes and specimen collected was sent to the lab for analysis    IMPRESSION:POST-PROCEDURE DX: Atelectasis secondary to mucous plug   RECOMMENDATION/PLAN: Continue with Mucomyst continue aggressive pulmonary  toilet and would also consider chest PT  I have personally performed the procedure as noted above     Allyne Gee, MD [facility] Pulmonary Critical Care Medicine

## 2018-06-24 DIAGNOSIS — I480 Paroxysmal atrial fibrillation: Secondary | ICD-10-CM | POA: Diagnosis not present

## 2018-06-24 DIAGNOSIS — I509 Heart failure, unspecified: Secondary | ICD-10-CM | POA: Diagnosis not present

## 2018-06-24 DIAGNOSIS — D612 Aplastic anemia due to other external agents: Secondary | ICD-10-CM | POA: Diagnosis not present

## 2018-06-24 DIAGNOSIS — J9621 Acute and chronic respiratory failure with hypoxia: Secondary | ICD-10-CM | POA: Diagnosis not present

## 2018-06-24 LAB — ACID FAST SMEAR (AFB): ACID FAST SMEAR - AFSCU2: NEGATIVE

## 2018-06-24 LAB — ACID FAST SMEAR (AFB, MYCOBACTERIA)

## 2018-06-24 NOTE — Progress Notes (Addendum)
Pulmonary Critical Care Medicine [facility] [place]   PULMONARY CRITICAL CARE SERVICE  PROGRESS NOTE  Date of Service: 06/24/2018  Kelsey Jensen  WUJ:[id number]  DOB: Dec 25, 1950   DOA: 06/17/2018  Referring Physician: Carron Curie, MD  HPI: Kelsey Jensen is a 68 y.o. female seen for follow up of Acute on Chronic Respiratory Failure.  Patient currently is on full support on assist control mode has the endotracheal tube in place.  She had a bronchoscopy done yesterday because of atelectasis of the left lung.  Seems be doing better follow-up chest x-ray was reviewed  Medications: Reviewed on Rounds  Physical Exam:  Vitals: Temperature 97.6 pulse 115 respiratory 29 blood pressure 115/57 saturations 96%  Ventilator Settings mode of ventilation assist control FiO2 55% tidal volume 471 PEEP 10  . General: Comfortable at this time . Eyes: Grossly normal lids, irises & conjunctiva . ENT: grossly tongue is normal . Neck: no obvious mass . Cardiovascular: S1 S2 normal no gallop . Respiratory: No rhonchi or rales are noted at this time . Abdomen: soft . Skin: no rash seen on limited exam . Musculoskeletal: not rigid . Psychiatric:unable to assess . Neurologic: no seizure no involuntary movements         Lab Data:   Basic Metabolic Panel: Recent Labs  Lab 06/18/18 0643 06/21/18 1231 06/23/18 0712  NA 143 142 141  K 4.5 4.3 4.0  CL 105 103 101  CO2 29 28 30   GLUCOSE 106* 136* 170*  BUN 34* 32* 43*  CREATININE 1.02* 0.81 0.93  CALCIUM 8.7* 8.8* 8.6*  MG  --  2.3  --     ABG: Recent Labs  Lab 06/17/18 1630 06/22/18 0845  PHART 7.385 7.479*  PCO2ART 51.3* 43.1  PO2ART 82.7* 57.6*  HCO3 29.9* 31.6*  O2SAT 95.6 89.6    Liver Function Tests: Recent Labs  Lab 06/18/18 0643  AST 12*  ALT 11  ALKPHOS 277*  BILITOT 0.2*  PROT 6.3*  ALBUMIN 1.4*   No results for input(s): LIPASE, AMYLASE in the last 168 hours. No results for input(s): AMMONIA in the last  168 hours.  CBC: Recent Labs  Lab 06/18/18 0643 06/21/18 1231 06/23/18 0712  WBC 12.0* 12.2* 19.8*  NEUTROABS 9.8*  --   --   HGB 10.6* 10.8* 9.6*  HCT 38.7 39.0 33.5*  MCV 89.0 89.0 87.9  PLT 545* 710* 781*    Cardiac Enzymes: Recent Labs  Lab 06/21/18 1231  CKTOTAL 16*    BNP (last 3 results) No results for input(s): BNP in the last 8760 hours.  ProBNP (last 3 results) No results for input(s): PROBNP in the last 8760 hours.  Radiological Exams: Dg Chest Port 1 View  Result Date: 06/23/2018 CLINICAL DATA:  Hypoxia EXAM: PORTABLE CHEST 1 VIEW COMPARISON:  April 23, 2019 FINDINGS: Endotracheal tube tip is 4.6 cm above the carina. Feeding tube tip is below the diaphragm. Central catheter tip is in the superior vena cava near the cavoatrial junction. No pneumothorax. Large pleural effusion noted 1 day prior is no longer evident. There are small pleural effusions bilaterally. There is airspace consolidation in the lower lobes bilaterally as well as to a lesser extent in each upper lobe. There is cardiomegaly with pulmonary venous hypertension. No adenopathy appreciable. No bone lesions. IMPRESSION: Tube and catheter positions as described without pneumothorax. Multifocal opacity, felt to represent multifocal pneumonia. A degree of superimposed alveolar edema is possible. There are small pleural effusions bilaterally. Note that the large  pleural effusion on the left is much smaller currently. There is pulmonary vascular congestion. Electronically Signed   By: Bretta Bang III M.D.   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 Acute on chronic respiratory failure with hypoxia patient will be continued on full vent  support at this time she is failed attempts at weaning.  She is expected to have prolonged mechanical ventilation and therefore will need a tracheostomy done.  ENT consultation has been obtained.  In addition she has had issues with secretions and therefore needs airway access 2. Severe sepsis with shock hemodynamically stable 3. Chronic congestive heart failure at baseline 4. Aplastic anemia continue to monitor labs 5. Chronic atrial fibrillation rate is controlled at this time   I have personally seen and evaluated the patient, evaluated laboratory and imaging results, formulated the assessment and plan and placed orders.  Time 35 minutes patient is orally intubated has a high risk airway patient is critically ill The Patient requires high complexity decision making for assessment and support.  Case was discussed on Rounds with the Respiratory Therapy Staff  Yevonne Pax, MD [facility] Pulmonary Critical Care Medicine Sleep Medicine

## 2018-06-25 DIAGNOSIS — J9621 Acute and chronic respiratory failure with hypoxia: Secondary | ICD-10-CM | POA: Diagnosis not present

## 2018-06-25 DIAGNOSIS — D612 Aplastic anemia due to other external agents: Secondary | ICD-10-CM | POA: Diagnosis not present

## 2018-06-25 DIAGNOSIS — I480 Paroxysmal atrial fibrillation: Secondary | ICD-10-CM | POA: Diagnosis not present

## 2018-06-25 DIAGNOSIS — I509 Heart failure, unspecified: Secondary | ICD-10-CM | POA: Diagnosis not present

## 2018-06-25 LAB — BASIC METABOLIC PANEL
Anion gap: 11 (ref 5–15)
BUN: 72 mg/dL — ABNORMAL HIGH (ref 8–23)
CO2: 29 mmol/L (ref 22–32)
Calcium: 8.7 mg/dL — ABNORMAL LOW (ref 8.9–10.3)
Chloride: 105 mmol/L (ref 98–111)
Creatinine, Ser: 1.38 mg/dL — ABNORMAL HIGH (ref 0.44–1.00)
GFR calc Af Amer: 45 mL/min — ABNORMAL LOW (ref >60)
GFR calc non Af Amer: 39 mL/min — ABNORMAL LOW (ref >60)
Glucose, Bld: 115 mg/dL — ABNORMAL HIGH (ref 70–99)
Potassium: 4 mmol/L (ref 3.5–5.1)
Sodium: 145 mmol/L (ref 135–145)

## 2018-06-25 NOTE — Progress Notes (Addendum)
[facility]ulmonary Critical Care [facility] [place]   PULMONARY CRITICAL CARE SERVICE  PROGRESS NOTE  Date of Service: 06/25/2018  Kelsey AbbeBrenda Jensen  ZOX:[id number]  DOB: 07/12/1950   DOA: 06/17/2018  Referring Physician: Carron CurieAli Hijazi, MD  HPI: Kelsey Jensen is a 68 y.o. female seen for follow up of Acute on Chronic Respiratory Failure.  She remains on the ventilator.  Is on assist control still requiring about 50% oxygen which is somewhat of an improvement she still is on a PEEP of 10.  We are waiting for her tracheostomy to be done  Medications: Reviewed on Rounds  Physical Exam:  Vitals: Temperature 97.7 pulse 125 respiratory rate 20 blood pressure 118/71 saturations 97%  Ventilator Settings currently on assist control FiO2 50% PEEP 10 tidal volume 379  . General: Comfortable at this time . Eyes: Grossly normal lids, irises & conjunctiva . ENT: grossly tongue is normal . Neck: no obvious mass . Cardiovascular: S1 S2 normal no gallop . Respiratory: No rhonchi or rales are noted at this time . Abdomen: soft . Skin: no rash seen on limited exam . Musculoskeletal: not rigid . Psychiatric:unable to assess . Neurologic: no seizure no involuntary movements         Lab Data:   Basic Metabolic Panel: Recent Labs  Lab 06/21/18 1231 06/23/18 0712  NA 142 141  K 4.3 4.0  CL 103 101  CO2 28 30  GLUCOSE 136* 170*  BUN 32* 43*  CREATININE 0.81 0.93  CALCIUM 8.8* 8.6*  MG 2.3  --     ABG: Recent Labs  Lab 06/22/18 0845  PHART 7.479*  PCO2ART 43.1  PO2ART 57.6*  HCO3 31.6*  O2SAT 89.6    Liver Function Tests: No results for input(s): AST, ALT, ALKPHOS, BILITOT, PROT, ALBUMIN in the last 168 hours. No results for input(s): LIPASE, AMYLASE in the last 168 hours. No results for input(s): AMMONIA in the last 168 hours.  CBC: Recent Labs  Lab 06/21/18 1231 06/23/18 0712  WBC 12.2* 19.8*  HGB 10.8* 9.6*  HCT 39.0 33.5*  MCV 89.0 87.9  PLT 710* 781*     Cardiac Enzymes: Recent Labs  Lab 06/21/18 1231  CKTOTAL 16*    BNP (last 3 results) No results for input(s): BNP in the last 8760 hours.  ProBNP (last 3 results) No results for input(s): PROBNP in the last 8760 hours.  Radiological Exams: No results found.  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 Acute on chronic respiratory failure with hypoxia we will continue with full support on the ventilator.  We are trying to gradually wean her oxygen down she is currently on assist control and is requiring 50% FiO2 with a PEEP of 10.  Once we can get her oxygen down to 40% then I would start to work on decreasing her PEEP.  In addition she is needs to have a tracheostomy done ENT consultation has been placed 2. Severe sepsis with shock right now is hemodynamically stable we will continue with the supportive care. 3. Chronic congestive heart failure clinically improving we will continue with supportive care and monitor the fluid status closely. 4. Aplastic anemia follow-up on labs 5. Chronic atrial fibrillation rate is controlled   I have personally seen and evaluated  the patient, evaluated laboratory and imaging results, formulated the assessment and plan and placed orders.  Time spent 35 minutes patient is critically ill in danger of cardiac arrest and death she has a high risk of oral intubation airway The Patient requires high complexity decision making for assessment and support.  Case was discussed on Rounds with the Respiratory Therapy Staff  Yevonne Pax, MD [facility] Pulmonary Critical Care Medicine Sleep Medicine

## 2018-06-26 DIAGNOSIS — J9621 Acute and chronic respiratory failure with hypoxia: Secondary | ICD-10-CM | POA: Diagnosis not present

## 2018-06-26 DIAGNOSIS — D612 Aplastic anemia due to other external agents: Secondary | ICD-10-CM | POA: Diagnosis not present

## 2018-06-26 DIAGNOSIS — I509 Heart failure, unspecified: Secondary | ICD-10-CM | POA: Diagnosis not present

## 2018-06-26 DIAGNOSIS — I480 Paroxysmal atrial fibrillation: Secondary | ICD-10-CM | POA: Diagnosis not present

## 2018-06-26 DIAGNOSIS — J90 Pleural effusion, not elsewhere classified: Secondary | ICD-10-CM

## 2018-06-26 NOTE — Progress Notes (Signed)
[facility] Critical [facility] [place]   PULMONARY CRITICAL CARE SERVICE  PROGRESS NOTE  Date of Service: 06/26/2018  Kelsey AbbeBrenda Guia  WUJ:[id number]  DOB: 08-Oct-1950   DOA: 06/17/2018  Referring Physician: Carron CurieAli Hijazi, MD  HPI: Kelsey Jensen is a 68 y.o. female seen for follow up of Acute on Chronic Respiratory Failure.  Patient is on full vent support has been on assist control currently is on 45% FiO2 with PEEP of 10  Medications: Reviewed on Rounds  Physical Exam:  Vitals: Temperature 98.1 pulse 97 respiratory 22 blood pressure 140/86 saturation 94%  Ventilator Settings mode ventilation assist control FiO2 45% tidal volume 468 PEEP 10  . General: Comfortable at this time . Eyes: Grossly normal lids, irises & conjunctiva . ENT: grossly tongue is normal . Neck: no obvious mass . Cardiovascular: S1 S2 normal no gallop . Respiratory: Coarse breath sounds with few rhonchi . Abdomen: soft . Skin: no rash seen on limited exam . Musculoskeletal: not rigid . Psychiatric:unable to assess . Neurologic: no seizure no involuntary movements         Lab Data:   Basic Metabolic Panel: Recent Labs  Lab 06/21/18 1231 06/23/18 0712 06/25/18 0742  NA 142 141 145  K 4.3 4.0 4.0  CL 103 101 105  CO2 28 30 29   GLUCOSE 136* 170* 115*  BUN 32* 43* 72*  CREATININE 0.81 0.93 1.38*  CALCIUM 8.8* 8.6* 8.7*  MG 2.3  --   --     ABG: Recent Labs  Lab 06/22/18 0845  PHART 7.479*  PCO2ART 43.1  PO2ART 57.6*  HCO3 31.6*  O2SAT 89.6    Liver Function Tests: No results for input(s): AST, ALT, ALKPHOS, BILITOT, PROT, ALBUMIN in the last 168 hours. No results for input(s): LIPASE, AMYLASE in the last 168 hours. No results for input(s): AMMONIA in the last 168 hours.  CBC: Recent Labs  Lab 06/21/18 1231 06/23/18 0712  WBC 12.2* 19.8*  HGB 10.8* 9.6*  HCT 39.0 33.5*  MCV 89.0 87.9  PLT 710* 781*    Cardiac Enzymes: Recent Labs  Lab 06/21/18 1231   CKTOTAL 16*    BNP (last 3 results) No results for input(s): BNP in the last 8760 hours.  ProBNP (last 3 results) No results for input(s): PROBNP in the last 8760 hours.  Radiological Exams: No results found.  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 Acute on chronic respiratory failure with hypoxia we will continue with full supportive care patient to have tracheostomy done. 2. Severe sepsis hemodynamically stable 3. Chronic congestive heart failure at baseline 4. Aplastic anemia unchanged 5. Atrial fibrillation rate is controlled   I have personally seen and evaluated the patient, evaluated laboratory and imaging results, formulated the assessment and plan and placed orders. The Patient requires high complexity decision making for assessment and support.  Case was discussed on Rounds with the Respiratory Therapy Staff  Yevonne PaxSaadat A Oakley Kossman, MD [facility] Pulmonary Critical Care Medicine Sleep Medicine

## 2018-06-27 ENCOUNTER — Encounter (HOSPITAL_COMMUNITY): Payer: Self-pay | Admitting: Certified Registered Nurse Anesthetist

## 2018-06-27 DIAGNOSIS — I480 Paroxysmal atrial fibrillation: Secondary | ICD-10-CM | POA: Diagnosis not present

## 2018-06-27 DIAGNOSIS — D612 Aplastic anemia due to other external agents: Secondary | ICD-10-CM | POA: Diagnosis not present

## 2018-06-27 DIAGNOSIS — I509 Heart failure, unspecified: Secondary | ICD-10-CM | POA: Diagnosis not present

## 2018-06-27 DIAGNOSIS — J9621 Acute and chronic respiratory failure with hypoxia: Secondary | ICD-10-CM | POA: Diagnosis not present

## 2018-06-27 LAB — CULTURE, BAL-QUANTITATIVE W GRAM STAIN: Culture: 20000 — AB

## 2018-06-27 NOTE — Progress Notes (Signed)
Pulmonary Critical Care Medicine [facility] [place]   PULMONARY CRITICAL CARE SERVICE  PROGRESS NOTE  Date of Service: 06/27/2018  Kelsey Jensen  NIO:[id number]  DOB: 1951-04-13   DOA: 06/17/2018  Referring Physician: Carron Curie, MD  HPI: Kelsey Jensen is a 68 y.o. female seen for follow up of Acute on Chronic Respiratory Failure.  Comfortable right now without distress patient is on assist control orally intubated.  Medications: Reviewed on Rounds  Physical Exam:  Vitals: Temperature 97.6 pulse 86 respiratory rate 15 blood pressure 112/74 saturations 98%  Ventilator Settings currently is on full support assist control FiO2 45% tidal volume 472 PEEP 5  . General: Comfortable at this time . Eyes: Grossly normal lids, irises & conjunctiva . ENT: grossly tongue is normal . Neck: no obvious mass . Cardiovascular: S1 S2 normal no gallop . Respiratory: No rhonchi or rales are noted at this time . Abdomen: soft . Skin: no rash seen on limited exam . Musculoskeletal: not rigid . Psychiatric:unable to assess . Neurologic: no seizure no involuntary movements         Lab Data:   Basic Metabolic Panel: Recent Labs  Lab 06/21/18 1231 06/23/18 0712 06/25/18 0742  NA 142 141 145  K 4.3 4.0 4.0  CL 103 101 105  CO2 28 30 29   GLUCOSE 136* 170* 115*  BUN 32* 43* 72*  CREATININE 0.81 0.93 1.38*  CALCIUM 8.8* 8.6* 8.7*  MG 2.3  --   --     ABG: Recent Labs  Lab 06/22/18 0845  PHART 7.479*  PCO2ART 43.1  PO2ART 57.6*  HCO3 31.6*  O2SAT 89.6    Liver Function Tests: No results for input(s): AST, ALT, ALKPHOS, BILITOT, PROT, ALBUMIN in the last 168 hours. No results for input(s): LIPASE, AMYLASE in the last 168 hours. No results for input(s): AMMONIA in the last 168 hours.  CBC: Recent Labs  Lab 06/21/18 1231 06/23/18 0712  WBC 12.2* 19.8*  HGB 10.8* 9.6*  HCT 39.0 33.5*  MCV 89.0 87.9  PLT 710* 781*    Cardiac Enzymes: Recent Labs  Lab  06/21/18 1231  CKTOTAL 16*    BNP (last 3 results) No results for input(s): BNP in the last 8760 hours.  ProBNP (last 3 results) No results for input(s): PROBNP in the last 8760 hours.  Radiological Exams: No results found.  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 Acute on chronic respiratory failure with hypoxia we will continue with full vent support patient needs to have tracheostomy done which will be hopefully next week 2. Severe sepsis with shock hemodynamically stable 3. Chronic congestive heart failure at baseline 4. Aplastic anemia treated we will continue with supportive care   I have personally seen and evaluated the patient, evaluated laboratory and imaging results, formulated the assessment and plan and placed orders. The Patient requires high complexity decision making for assessment and support.  Case was discussed on Rounds with the Respiratory Therapy Staff  Yevonne Pax, MD [facility] Pulmonary Critical Care Medicine Sleep Medicine

## 2018-06-27 NOTE — Anesthesia Preprocedure Evaluation (Deleted)
Anesthesia Evaluation    Reviewed: Allergy & Precautions, Patient's Chart, lab work & pertinent test results  Airway        Dental   Pulmonary neg pulmonary ROS,           Cardiovascular +CHF    TTE 2014 EF 64%, mild mod TR   Neuro/Psych negative neurological ROS  negative psych ROS   GI/Hepatic negative GI ROS, Neg liver ROS,   Endo/Other  negative endocrine ROS  Renal/GU negative Renal ROS  negative genitourinary   Musculoskeletal negative musculoskeletal ROS (+)   Abdominal   Peds  Hematology  (+) Blood dyscrasia, anemia ,   Anesthesia Other Findings Acute on chronic respiratory failure  Presented from OSH on 06/17/18 with sepsis 2/2 pneumonia and E.Coli UTI. Intubated for worsening SOB.  On arrival, Hgb 4.8 now 9.6  Vent settings: assist control 45% FiO2, PEEP 10  Reproductive/Obstetrics                             Anesthesia Physical Anesthesia Plan  ASA: III  Anesthesia Plan: General   Post-op Pain Management:    Induction: Inhalational  PONV Risk Score and Plan: 3 and Treatment may vary due to age or medical condition and Ondansetron  Airway Management Planned: Tracheostomy and Oral ETT  Additional Equipment:   Intra-op Plan:   Post-operative Plan: Post-operative intubation/ventilation  Informed Consent: I have reviewed the patients History and Physical, chart, labs and discussed the procedure including the risks, benefits and alternatives for the proposed anesthesia with the patient or authorized representative who has indicated his/her understanding and acceptance.   Dental advisory given  Plan Discussed with: CRNA  Anesthesia Plan Comments:         Anesthesia Quick Evaluation

## 2018-06-28 ENCOUNTER — Inpatient Hospital Stay (HOSPITAL_COMMUNITY)
Admission: RE | Admit: 2018-06-28 | Discharge: 2018-08-12 | Disposition: A | Discharge status: Rehabilitation Facility | Payer: Medicare Other | Source: Ambulatory Visit | Attending: Internal Medicine | Admitting: Internal Medicine

## 2018-06-28 ENCOUNTER — Other Ambulatory Visit (HOSPITAL_COMMUNITY): Payer: Medicare Other

## 2018-06-28 ENCOUNTER — Encounter (HOSPITAL_COMMUNITY)
Admission: AD | Disposition: A | Discharge status: Nursing Home (Includes ICF) | Payer: Self-pay | Attending: Internal Medicine

## 2018-06-28 ENCOUNTER — Encounter (HOSPITAL_COMMUNITY): Payer: Medicare Other | Admitting: Certified Registered Nurse Anesthetist

## 2018-06-28 DIAGNOSIS — A419 Sepsis, unspecified organism: Secondary | ICD-10-CM | POA: Diagnosis not present

## 2018-06-28 DIAGNOSIS — J9621 Acute and chronic respiratory failure with hypoxia: Secondary | ICD-10-CM | POA: Diagnosis not present

## 2018-06-28 DIAGNOSIS — R0902 Hypoxemia: Secondary | ICD-10-CM

## 2018-06-28 DIAGNOSIS — I509 Heart failure, unspecified: Secondary | ICD-10-CM | POA: Diagnosis not present

## 2018-06-28 DIAGNOSIS — R6521 Severe sepsis with septic shock: Secondary | ICD-10-CM

## 2018-06-28 DIAGNOSIS — J9690 Respiratory failure, unspecified, unspecified whether with hypoxia or hypercapnia: Secondary | ICD-10-CM

## 2018-06-28 DIAGNOSIS — R509 Fever, unspecified: Secondary | ICD-10-CM

## 2018-06-28 DIAGNOSIS — I2699 Other pulmonary embolism without acute cor pulmonale: Secondary | ICD-10-CM | POA: Diagnosis present

## 2018-06-28 DIAGNOSIS — Z931 Gastrostomy status: Secondary | ICD-10-CM

## 2018-06-28 DIAGNOSIS — I480 Paroxysmal atrial fibrillation: Secondary | ICD-10-CM | POA: Diagnosis not present

## 2018-06-28 HISTORY — PX: TRACHEOSTOMY TUBE PLACEMENT: SHX814

## 2018-06-28 HISTORY — DX: Other pulmonary embolism without acute cor pulmonale: I26.99

## 2018-06-28 LAB — BASIC METABOLIC PANEL
Anion gap: 8 (ref 5–15)
BUN: 55 mg/dL — ABNORMAL HIGH (ref 8–23)
CALCIUM: 8.5 mg/dL — AB (ref 8.9–10.3)
CO2: 33 mmol/L — ABNORMAL HIGH (ref 22–32)
Chloride: 104 mmol/L (ref 98–111)
Creatinine, Ser: 0.97 mg/dL (ref 0.44–1.00)
GFR calc Af Amer: >60 mL/min (ref >60)
GFR, EST NON AFRICAN AMERICAN: 60 mL/min — AB (ref >60)
Glucose, Bld: 124 mg/dL — ABNORMAL HIGH (ref 70–99)
Potassium: 3.3 mmol/L — ABNORMAL LOW (ref 3.5–5.1)
Sodium: 145 mmol/L (ref 135–145)

## 2018-06-28 LAB — CBC
HCT: 34.4 % — ABNORMAL LOW (ref 36.0–46.0)
Hemoglobin: 10 g/dL — ABNORMAL LOW (ref 12.0–15.0)
MCH: 25.9 pg — ABNORMAL LOW (ref 26.0–34.0)
MCHC: 29.1 g/dL — ABNORMAL LOW (ref 30.0–36.0)
MCV: 89.1 fL (ref 80.0–100.0)
Platelets: 747 10*3/uL — ABNORMAL HIGH (ref 150–400)
RBC: 3.86 MIL/uL — ABNORMAL LOW (ref 3.87–5.11)
RDW: 20.4 % — AB (ref 11.5–15.5)
WBC: 12.4 10*3/uL — ABNORMAL HIGH (ref 4.0–10.5)
nRBC: 0 % (ref 0.0–0.2)

## 2018-06-28 LAB — PROTIME-INR
INR: 1.15
Prothrombin Time: 14.6 seconds (ref 11.4–15.2)

## 2018-06-28 SURGERY — CREATION, TRACHEOSTOMY
Anesthesia: General

## 2018-06-28 SURGERY — CREATION, TRACHEOSTOMY
Anesthesia: General | Site: Neck

## 2018-06-28 MED ORDER — 0.9 % SODIUM CHLORIDE (POUR BTL) OPTIME
TOPICAL | Status: DC | PRN
  Administered 2018-06-28: 1000 mL

## 2018-06-28 MED ORDER — FENTANYL CITRATE (PF) 250 MCG/5ML IJ SOLN
INTRAMUSCULAR | Status: AC
  Filled 2018-06-28: qty 5

## 2018-06-28 MED ORDER — PROPOFOL 10 MG/ML IV BOLUS
INTRAVENOUS | Status: AC
  Filled 2018-06-28: qty 20

## 2018-06-28 MED ORDER — ONDANSETRON HCL 4 MG/2ML IJ SOLN
INTRAMUSCULAR | Status: DC | PRN
  Administered 2018-06-28: 4 mg via INTRAVENOUS

## 2018-06-28 MED ORDER — LIDOCAINE-EPINEPHRINE 1 %-1:100000 IJ SOLN
INTRAMUSCULAR | Status: DC | PRN
  Administered 2018-06-28: 4 mL

## 2018-06-28 MED ORDER — LACTATED RINGERS IV SOLN
INTRAVENOUS | Status: DC | PRN
  Administered 2018-06-28: 08:00:00 via INTRAVENOUS

## 2018-06-28 MED ORDER — PHENYLEPHRINE 40 MCG/ML (10ML) SYRINGE FOR IV PUSH (FOR BLOOD PRESSURE SUPPORT)
PREFILLED_SYRINGE | INTRAVENOUS | Status: AC
  Filled 2018-06-28: qty 10

## 2018-06-28 MED ORDER — MIDAZOLAM HCL 5 MG/5ML IJ SOLN
INTRAMUSCULAR | Status: DC | PRN
  Administered 2018-06-28: 2 mg via INTRAVENOUS

## 2018-06-28 MED ORDER — PROPOFOL 10 MG/ML IV BOLUS
INTRAVENOUS | Status: DC | PRN
  Administered 2018-06-28 (×3): 30 mg via INTRAVENOUS

## 2018-06-28 MED ORDER — PHENYLEPHRINE HCL 10 MG/ML IJ SOLN
INTRAMUSCULAR | Status: DC | PRN
  Administered 2018-06-28: 200 ug via INTRAVENOUS
  Administered 2018-06-28 (×2): 120 ug via INTRAVENOUS
  Administered 2018-06-28 (×2): 200 ug via INTRAVENOUS
  Administered 2018-06-28: 160 ug via INTRAVENOUS

## 2018-06-28 MED ORDER — MIDAZOLAM HCL 2 MG/2ML IJ SOLN
INTRAMUSCULAR | Status: AC
  Filled 2018-06-28: qty 2

## 2018-06-28 MED ORDER — ROCURONIUM BROMIDE 50 MG/5ML IV SOSY
PREFILLED_SYRINGE | INTRAVENOUS | Status: AC
  Filled 2018-06-28: qty 5

## 2018-06-28 MED ORDER — EPHEDRINE 5 MG/ML INJ
INTRAVENOUS | Status: AC
  Filled 2018-06-28: qty 10

## 2018-06-28 MED ORDER — LIDOCAINE 2% (20 MG/ML) 5 ML SYRINGE
INTRAMUSCULAR | Status: AC
  Filled 2018-06-28: qty 5

## 2018-06-28 MED ORDER — FENTANYL CITRATE (PF) 250 MCG/5ML IJ SOLN
INTRAMUSCULAR | Status: DC | PRN
  Administered 2018-06-28: 100 ug via INTRAVENOUS

## 2018-06-28 MED ORDER — SUCCINYLCHOLINE CHLORIDE 200 MG/10ML IV SOSY
PREFILLED_SYRINGE | INTRAVENOUS | Status: AC
  Filled 2018-06-28: qty 10

## 2018-06-28 MED ORDER — DEXAMETHASONE SODIUM PHOSPHATE 10 MG/ML IJ SOLN
INTRAMUSCULAR | Status: AC
  Filled 2018-06-28: qty 2

## 2018-06-28 MED ORDER — ONDANSETRON HCL 4 MG/2ML IJ SOLN
INTRAMUSCULAR | Status: AC
  Filled 2018-06-28: qty 2

## 2018-06-28 SURGICAL SUPPLY — 39 items
ATTRACTOMAT 16X20 MAGNETIC DRP (DRAPES) ×3 IMPLANT
BLADE SURG 15 STRL LF DISP TIS (BLADE) ×1 IMPLANT
BLADE SURG 15 STRL SS (BLADE) ×2
CLEANER TIP ELECTROSURG 2X2 (MISCELLANEOUS) ×3 IMPLANT
COVER SURGICAL LIGHT HANDLE (MISCELLANEOUS) ×3 IMPLANT
COVER WAND RF STERILE (DRAPES) ×3 IMPLANT
DRAPE HALF SHEET 40X57 (DRAPES) IMPLANT
ELECT COATED BLADE 2.86 ST (ELECTRODE) ×3 IMPLANT
ELECT REM PT RETURN 9FT ADLT (ELECTROSURGICAL) ×3
ELECTRODE REM PT RTRN 9FT ADLT (ELECTROSURGICAL) ×1 IMPLANT
GAUZE 4X4 16PLY RFD (DISPOSABLE) ×3 IMPLANT
GEL ULTRASOUND 20GR AQUASONIC (MISCELLANEOUS) ×3 IMPLANT
GLOVE SS BIOGEL STRL SZ 7.5 (GLOVE) ×1 IMPLANT
GLOVE SUPERSENSE BIOGEL SZ 7.5 (GLOVE) ×2
GOWN STRL REUS W/ TWL LRG LVL3 (GOWN DISPOSABLE) ×1 IMPLANT
GOWN STRL REUS W/ TWL XL LVL3 (GOWN DISPOSABLE) ×1 IMPLANT
GOWN STRL REUS W/TWL LRG LVL3 (GOWN DISPOSABLE) ×2
GOWN STRL REUS W/TWL XL LVL3 (GOWN DISPOSABLE) ×2
HOLDER TRACH TUBE VELCRO 19.5 (MISCELLANEOUS) ×3 IMPLANT
KIT BASIN OR (CUSTOM PROCEDURE TRAY) ×3 IMPLANT
KIT SUCTION CATH 14FR (SUCTIONS) ×3 IMPLANT
KIT TURNOVER KIT B (KITS) ×3 IMPLANT
NEEDLE HYPO 25GX1X1/2 BEV (NEEDLE) ×3 IMPLANT
NS IRRIG 1000ML POUR BTL (IV SOLUTION) ×3 IMPLANT
PACK EENT II TURBAN DRAPE (CUSTOM PROCEDURE TRAY) ×3 IMPLANT
PAD ARMBOARD 7.5X6 YLW CONV (MISCELLANEOUS) IMPLANT
PENCIL BUTTON HOLSTER BLD 10FT (ELECTRODE) ×3 IMPLANT
SPONGE DRAIN TRACH 4X4 STRL 2S (GAUZE/BANDAGES/DRESSINGS) ×3 IMPLANT
SPONGE INTESTINAL PEANUT (DISPOSABLE) ×3 IMPLANT
SUT SILK 2 0 SH CR/8 (SUTURE) ×3 IMPLANT
SUT SILK 3 0 TIES 10X30 (SUTURE) IMPLANT
SYR 5ML LUER SLIP (SYRINGE) ×3 IMPLANT
SYR CONTROL 10ML LL (SYRINGE) ×3 IMPLANT
TOWEL OR 17X24 6PK STRL BLUE (TOWEL DISPOSABLE) ×3 IMPLANT
TOWEL OR 17X26 10 PK STRL BLUE (TOWEL DISPOSABLE) ×3 IMPLANT
TUBE CONNECTING 12'X1/4 (SUCTIONS) ×1
TUBE CONNECTING 12X1/4 (SUCTIONS) ×2 IMPLANT
TUBE TRACH SHILEY  6 DIST  CUF (TUBING) ×3 IMPLANT
TUBE TRACH SHILEY 8 DIST CUF (TUBING) IMPLANT

## 2018-06-28 NOTE — Brief Op Note (Signed)
06/28/2018  8:52 AM  PATIENT:  Kelsey Jensen  68 y.o. female  PRE-OPERATIVE DIAGNOSIS:  Acute on Chronic respiratory failure  POST-OPERATIVE DIAGNOSIS:  ventilator dependent respiratory failure  PROCEDURE:  Procedure(s): TRACHEOSTOMY (N/A)  SURGEON:  Surgeon(s) and Role:    Drema Halon, MD - Primary  PHYSICIAN ASSISTANT:   ASSISTANTS: none   ANESTHESIA:   general  EBL:  minimal   BLOOD ADMINISTERED:none  DRAINS: none   LOCAL MEDICATIONS USED:  XYLOCAINE with EPI 6 cc  SPECIMEN:  No Specimen  DISPOSITION OF SPECIMEN:  N/A  COUNTS:  YES  TOURNIQUET:  * No tourniquets in log *  DICTATION: .Other Dictation: Dictation Number [id number]  PLAN OF CARE: Discharge to home after PACU  PATIENT DISPOSITION:  PACU - hemodynamically stable.   Delay start of Pharmacological VTE agent (>24hrs) due to surgical blood loss or risk of bleeding: yes

## 2018-06-28 NOTE — Anesthesia Postprocedure Evaluation (Signed)
Anesthesia Post Note  Patient: NIMUE POYNTER  Procedure(s) Performed: TRACHEOSTOMY (N/A Neck)     Patient location during evaluation: Other (Select) Anesthesia Type: General Level of consciousness: awake Pain management: pain level controlled Vital Signs Assessment: post-procedure vital signs reviewed and stable Respiratory status: nonlabored ventilation and patient on ventilator - see flowsheet for VS Cardiovascular status: blood pressure returned to baseline Postop Assessment: no apparent nausea or vomiting Anesthetic complications: no    Last Vitals: There were no vitals filed for this visit.  Last Pain: There were no vitals filed for this visit.               Chelsey L Woodrum

## 2018-06-28 NOTE — Transfer of Care (Signed)
Immediate Anesthesia Transfer of Care Note  Patient: Kelsey Jensen  Procedure(s) Performed: TRACHEOSTOMY (N/A Neck)  [facility]  Anesthesia Type:General  Level of Consciousness: Patient remains intubated per anesthesia plan  [facility]  Airway & Oxygen Therapy: Patient placed on Ventilator (see vital sign flow sheet for setting)  Post-op Assessment: Report given to RN and Post -op Vital signs reviewed and stable  Post vital signs: Reviewed and stable  Last Vitals:  Vitals Value Taken Time  BP    Temp    Pulse    Resp    SpO2      Last Pain: There were no vitals filed for this visit.       Complications: No apparent anesthesia complications

## 2018-06-28 NOTE — Anesthesia Preprocedure Evaluation (Signed)
Anesthesia Evaluation  Patient identified by MRN, date of birth, ID band Patient awake    Reviewed: Allergy & Precautions, NPO status , Patient's Chart, lab work & pertinent test results  Airway Mallampati: Intubated       Dental   Unable to assess as pt is intubated:   Pulmonary neg pulmonary ROS,    + rhonchi        Cardiovascular +CHF   Rhythm:Regular Rate:Normal  TTE 2014 EF 64%, mild mod TR   Neuro/Psych negative neurological ROS  negative psych ROS   GI/Hepatic negative GI ROS, Neg liver ROS,   Endo/Other  negative endocrine ROS  Renal/GU negative Renal ROS  negative genitourinary   Musculoskeletal negative musculoskeletal ROS (+)   Abdominal   Peds  Hematology  (+) Blood dyscrasia, anemia ,   Anesthesia Other Findings Acute on chronic respiratory failure  Presented from OSH on 06/17/18 with sepsis 2/2 pneumonia and E.Coli UTI. Intubated for worsening SOB.  On arrival, Hgb 4.8 now 9.6  Vent settings: assist control 45% FiO2, PEEP 10  Reproductive/Obstetrics                            Anesthesia Physical Anesthesia Plan  ASA: III  Anesthesia Plan: General   Post-op Pain Management:    Induction: Inhalational  PONV Risk Score and Plan: 3 and Treatment may vary due to age or medical condition, Ondansetron, Dexamethasone and Midazolam  Airway Management Planned: Tracheostomy and Oral ETT  Additional Equipment:   Intra-op Plan:   Post-operative Plan: Post-operative intubation/ventilation  Informed Consent: I have reviewed the patients History and Physical, chart, labs and discussed the procedure including the risks, benefits and alternatives for the proposed anesthesia with the patient or authorized representative who has indicated his/her understanding and acceptance.   Dental advisory given  Plan Discussed with: CRNA  Anesthesia Plan Comments:          Anesthesia Quick Evaluation

## 2018-06-28 NOTE — Interval H&P Note (Signed)
History and Physical Interval Note:  06/28/2018 7:56 AM  Kelsey Jensen  has presented today for surgery, with the diagnosis of respiratory failure  The various methods of treatment have been discussed with the patient and family. After consideration of risks, benefits and other options for treatment, the patient has consented to  Procedure(s): TRACHEOSTOMY (N/A) as a surgical intervention .  The patient's history has been reviewed, patient examined, no change in status, stable for surgery.  I have reviewed the patient's chart and labs.  Questions were answered to the patient's satisfaction.     Dillard Cannonhristopher Laycee Fitzsimmons

## 2018-06-28 NOTE — Op Note (Signed)
NAME: [name], [name] MEDICAL RECORD [id number] ACCOUNT [id number] DATE OF BIRTH:04-03-1951 FACILITY: [facility] LOCATION: [facility] PHYSICIAN:Tryone Kille Braxton Feathers, MD  OPERATIVE REPORT  DATE OF PROCEDURE:  06/28/2018  PREOPERATIVE DIAGNOSIS:  Acute on chronic respiratory failure.  POSTOPERATIVE DIAGNOSIS:  Acute on chronic respiratory failure.  OPERATION PERFORMED:  Tracheostomy with a #6 Shiley cuffed.  SURGEON:  Dillard Cannon, MD  ANESTHESIA:  General endotracheal.  FLUIDS:  Minimal.  COMPLICATIONS:  None.  BRIEF CLINICAL NOTE:  Kelsey Jensen is a 68 year old female with history of aplastic anemia, hypertension, congestive heart failure and COPD.  She was recently admitted to outside hospital with severe anemia, which was requiring transfusions as well as  upper respiratory infection and respiratory failure requiring intubation.  The patient was septic secondary to pneumonia and was treated with IV antibiotics.  She was intubated on 06/15/2018.  She was subsequently transferred to [facility]  on 06/17/2018 on the ventilator and intubated.  Weaning has been very slow and prolonged intubation was expected and a tracheostomy was recommended.  She is taken to the operating room this time for tracheostomy.  DESCRIPTION OF PROCEDURE:  The patient was brought straight down from [facility] remained in her bed.  A roll was placed underneath the shoulders and her neck was palpated and the trachea was marked and an area of incision was injected  with 6 mL of Xylocaine with epinephrine for hemostasis.  The area was then prepped with Betadine solution and draped in sterile towels.  A vertical incision was made midline just below the cricoid cartilage.  Dissection was carried down through the skin  and subcutaneous tissue with cautery.  Strap muscles were divided in midline and retracted laterally.  The cricoid cartilage was identified as was a large thyroid  isthmus.  Thyroid isthmus was divided with cautery.  The first 3 tracheal rings were  exposed and a horizontal tracheotomy was performed between the first and second tracheal rings.  The endotracheal tube was removed and a #6 Shiley tube was inserted without difficulty.  The patient was ventilated well.  This was secured to the neck with  2-0 silk sutures x4 and Velcro trach collar around the neck.    The patient was subsequently transferred back to [facility].  AN/NUANCE  D:06/28/2018 T:06/28/2018 JOB:[id number]

## 2018-06-28 NOTE — Progress Notes (Signed)
Pulmonary Critical Care Medicine [facility] [place]   PULMONARY CRITICAL CARE SERVICE  PROGRESS NOTE  Date of Service: 06/28/2018  Kelsey Jensen  TFT:[id number]  DOB: 08/13/1950   DOA: 06/28/2018  Referring Physician: Carron Curie, MD  HPI: Kelsey Jensen is a 68 y.o. female seen for follow up of Acute on Chronic Respiratory Failure.  Patient had a tracheostomy done today is actually doing better.  Remains sedated however right now is on full support  Medications: Reviewed on Rounds  Physical Exam:  Vitals: Temperature 98.1 pulse 83 respiratory 18 blood pressure 156/95 saturations 92%  Ventilator Settings mode ventilation assist control FiO2 40% tidal volume 472 PEEP 10  . General: Comfortable at this time . Eyes: Grossly normal lids, irises & conjunctiva . ENT: grossly tongue is normal . Neck: no obvious mass . Cardiovascular: S1 S2 normal no gallop . Respiratory: No rhonchi or rales are noted at this time . Abdomen: soft . Skin: no rash seen on limited exam . Musculoskeletal: not rigid . Psychiatric:unable to assess . Neurologic: no seizure no involuntary movements         Lab Data:   Basic Metabolic Panel: Recent Labs  Lab 06/23/18 0712 06/25/18 0742 06/28/18 0614  NA 141 145 145  K 4.0 4.0 3.3*  CL 101 105 104  CO2 30 29 33*  GLUCOSE 170* 115* 124*  BUN 43* 72* 55*  CREATININE 0.93 1.38* 0.97  CALCIUM 8.6* 8.7* 8.5*    ABG: Recent Labs  Lab 06/22/18 0845  PHART 7.479*  PCO2ART 43.1  PO2ART 57.6*  HCO3 31.6*  O2SAT 89.6    Liver Function Tests: No results for input(s): AST, ALT, ALKPHOS, BILITOT, PROT, ALBUMIN in the last 168 hours. No results for input(s): LIPASE, AMYLASE in the last 168 hours. No results for input(s): AMMONIA in the last 168 hours.  CBC: Recent Labs  Lab 06/23/18 0712 06/28/18 0614  WBC 19.8* 12.4*  HGB 9.6* 10.0*  HCT 33.5* 34.4*  MCV 87.9 89.1  PLT 781* 747*    Cardiac Enzymes: No results for  input(s): CKTOTAL, CKMB, CKMBINDEX, TROPONINI in the last 168 hours.  BNP (last 3 results) No results for input(s): BNP in the last 8760 hours.  ProBNP (last 3 results) No results for input(s): PROBNP in the last 8760 hours.  Radiological Exams: Dg Chest Port 1 View  Result Date: 06/28/2018 CLINICAL DATA:  Respiratory failure EXAM: PORTABLE CHEST 1 VIEW COMPARISON:  06/23/2018 FINDINGS: Cardiac shadow is enlarged. Endotracheal tube, feeding catheter and left-sided PICC line are again seen and stable. Bilateral pleural effusions are noted right greater than left with associated atelectasis. Mild vascular congestion is noted. No bony abnormality is seen. IMPRESSION: Bilateral effusions right greater than left Mild vascular congestion. Electronically Signed   By: Alcide Clever M.D.   On: 06/28/2018 07:36    Assessment/Plan Active Problems:   Acute on chronic respiratory failure with hypoxia (HCC)   Severe sepsis with septic shock (HCC)   Chronic congestive heart failure (HCC)   AF (paroxysmal atrial fibrillation) (HCC)   1. Acute on chronic respiratory failure with hypoxia we will continue with assessing the RSB I and try to wean as tolerated. 2. Severe sepsis with shock hemodynamically stable continue present management 3. Chronic congestive heart failure at baseline continue supportive care 4. Atrial fibrillation rate controlled 5. Status post tracheostomy no bleeding noted we will continue to monitor   I have personally seen and evaluated the patient, evaluated laboratory and imaging  results, formulated the assessment and plan and placed orders. The Patient requires high complexity decision making for assessment and support.  Case was discussed on Rounds with the Respiratory Therapy Staff  Allyne Gee, MD [facility] Pulmonary Critical Care Medicine Sleep Medicine

## 2018-06-28 NOTE — H&P (Signed)
PREOPERATIVE H&P  Chief Complaint: Respiratory failure  HPI: Kelsey Jensen is a 68 y.o. female who presents for evaluation of acute on chronic respiratory failure.  Patient with history of hypertension, congestive heart failure and chronic aplastic anemia dependent on transfusions.  She recently developed pneumonia with sepsis and respiratory failure and was intubated on 06/15/2018.  She presented to the ER with a hemoglobin of 4.8 requiring multiple transfusions.  [facility] intubated and on the vent.  Weaning the patient has been unsuccessful and a prolonged intubation is expected and tracheostomy was recommended.  She is taken the operating room this time for tracheostomy  No past medical history on file.  Social History   Socioeconomic History  . Marital status: Divorced    Spouse name: Not on file  . Number of children: Not on file  . Years of education: Not on file  . Highest education level: Not on file  Occupational History  . Not on file  Social Needs  . Financial resource strain: Not on file  . Food insecurity:    Worry: Not on file    Inability: Not on file  . Transportation needs:    Medical: Not on file    Non-medical: Not on file  Tobacco Use  . Smoking status: Not on file  Substance and Sexual Activity  . Alcohol use: Not on file  . Drug use: Not on file  . Sexual activity: Not on file  Lifestyle  . Physical activity:    Days per week: Not on file    Minutes per session: Not on file  . Stress: Not on file  Relationships  . Social connections:    Talks on phone: Not on file    Gets together: Not on file    Attends religious service: Not on file    Active member of club or organization: Not on file    Attends meetings of clubs or organizations: Not on file    Relationship status: Not on file  Other Topics Concern  . Not on file  Social History Narrative  . Not on file   No family history on  file. Allergies not on file Prior to Admission medications   Not on File     Positive ROS: Patient is alert and tracheostomy was discussed with patient.  All other systems have been reviewed and were otherwise negative with the exception of those mentioned in the HPI and as above.  Physical Exam: There were no vitals filed for this visit.  General: Alert, intubated on vent. Nasal: Clear nasal passages.  NG tube in place. Neck: No palpable adenopathy or thyroid nodules.  Kelsey Jensen is palpably midline. Cardiovascular: Irregular rate and rhythm, no murmur.  Respiratory: Clear to auscultation Neurologic: Alert and oriented x 3   Assessment/Plan: respiratory failure Plan for Procedure(s): TRACHEOSTOMY   Dillard Cannonhristopher , MD 06/28/2018 7:49 AM

## 2018-06-29 ENCOUNTER — Encounter (HOSPITAL_COMMUNITY): Payer: Self-pay | Admitting: Otolaryngology

## 2018-06-29 ENCOUNTER — Other Ambulatory Visit (HOSPITAL_COMMUNITY): Payer: Medicare Other

## 2018-06-29 DIAGNOSIS — A419 Sepsis, unspecified organism: Secondary | ICD-10-CM | POA: Diagnosis not present

## 2018-06-29 DIAGNOSIS — J9621 Acute and chronic respiratory failure with hypoxia: Secondary | ICD-10-CM | POA: Diagnosis not present

## 2018-06-29 DIAGNOSIS — I480 Paroxysmal atrial fibrillation: Secondary | ICD-10-CM | POA: Diagnosis not present

## 2018-06-29 DIAGNOSIS — I509 Heart failure, unspecified: Secondary | ICD-10-CM | POA: Diagnosis not present

## 2018-06-29 NOTE — Progress Notes (Addendum)
Pulmonary Critical Care Medicine [facility] [place]   PULMONARY CRITICAL CARE SERVICE  PROGRESS NOTE  Date of Service: 06/29/2018  Kelsey Jensen  UVO:[id number]  DOB: 08/22/1950   DOA: 06/28/2018  Referring Physician: Carron Curie, MD  HPI: Kelsey Jensen is a 68 y.o. female seen for follow up of Acute on Chronic Respiratory Failure.  Patient is currently on a pressure support wean the goal is for 2 hours seems to be tolerating it well so far  Medications: Reviewed on Rounds  Physical Exam:  Vitals: Temperature 96.6 pulse 100 respiratory 38 blood pressure 143/90 saturations 95%  Ventilator Settings mode ventilation pressure support FiO2 40% tidal volume 408 pressure support 12 PEEP 8  . General: Comfortable at this time . Eyes: Grossly normal lids, irises & conjunctiva . ENT: grossly tongue is normal . Neck: no obvious mass . Cardiovascular: S1 S2 normal no gallop . Respiratory: No rhonchi or rales noted at this time . Abdomen: soft . Skin: no rash seen on limited exam . Musculoskeletal: not rigid . Psychiatric:unable to assess . Neurologic: no seizure no involuntary movements         Lab Data:   Basic Metabolic Panel: Recent Labs  Lab 06/23/18 0712 06/25/18 0742 06/28/18 0614  NA 141 145 145  K 4.0 4.0 3.3*  CL 101 105 104  CO2 30 29 33*  GLUCOSE 170* 115* 124*  BUN 43* 72* 55*  CREATININE 0.93 1.38* 0.97  CALCIUM 8.6* 8.7* 8.5*    ABG: No results for input(s): PHART, PCO2ART, PO2ART, HCO3, O2SAT in the last 168 hours.  Liver Function Tests: No results for input(s): AST, ALT, ALKPHOS, BILITOT, PROT, ALBUMIN in the last 168 hours. No results for input(s): LIPASE, AMYLASE in the last 168 hours. No results for input(s): AMMONIA in the last 168 hours.  CBC: Recent Labs  Lab 06/23/18 0712 06/28/18 0614  WBC 19.8* 12.4*  HGB 9.6* 10.0*  HCT 33.5* 34.4*  MCV 87.9 89.1  PLT 781* 747*    Cardiac Enzymes: No results for input(s): CKTOTAL,  CKMB, CKMBINDEX, TROPONINI in the last 168 hours.  BNP (last 3 results) No results for input(s): BNP in the last 8760 hours.  ProBNP (last 3 results) No results for input(s): PROBNP in the last 8760 hours.  Radiological Exams: Dg Chest Port 1 View  Result Date: 06/28/2018 CLINICAL DATA:  Respiratory failure EXAM: PORTABLE CHEST 1 VIEW COMPARISON:  06/23/2018 FINDINGS: Cardiac shadow is enlarged. Endotracheal tube, feeding catheter and left-sided PICC line are again seen and stable. Bilateral pleural effusions are noted right greater than left with associated atelectasis. Mild vascular congestion is noted. No bony abnormality is seen. IMPRESSION: Bilateral effusions right greater than left Mild vascular congestion. Electronically Signed   By: Alcide Clever M.D.   On: 06/28/2018 07:36    Assessment/Plan Active Problems:   Acute on chronic respiratory failure with hypoxia (HCC)   Severe sepsis with septic shock (HCC)   Chronic congestive heart failure (HCC)   AF (paroxysmal atrial fibrillation) (HCC)   1. Acute on chronic respiratory failure with hypoxia continue with the wean protocol goal is 2 hours on pressure support 2. Severe sepsis hemodynamically stable continue present management 3. Chronic congestive heart failure at baseline 4. Atrial fibrillation Rate is controlled chronic   I have personally seen and evaluated the patient, evaluated laboratory and imaging results, formulated the assessment and plan and placed orders. The Patient requires high complexity decision making for assessment and support.  Case  was discussed on Rounds with the Respiratory Therapy Staff  Allyne Gee, MD [facility] Pulmonary Critical Care Medicine Sleep Medicine

## 2018-06-30 DIAGNOSIS — I509 Heart failure, unspecified: Secondary | ICD-10-CM | POA: Diagnosis not present

## 2018-06-30 DIAGNOSIS — J9621 Acute and chronic respiratory failure with hypoxia: Secondary | ICD-10-CM | POA: Diagnosis not present

## 2018-06-30 DIAGNOSIS — I480 Paroxysmal atrial fibrillation: Secondary | ICD-10-CM | POA: Diagnosis not present

## 2018-06-30 DIAGNOSIS — A419 Sepsis, unspecified organism: Secondary | ICD-10-CM | POA: Diagnosis not present

## 2018-06-30 NOTE — Progress Notes (Signed)
[facility] [place]   PULMONARY CRITICAL CARE SERVICE  PROGRESS NOTE  Date of Service: 06/30/2018  Carolan ClinesBrenda T Ziska  ZOX:[id number]  DOB: 12/25/1950   DOA: 06/28/2018  Referring Physician: Carron CurieAli Hijazi, MD  HPI: Carolan ClinesBrenda T Winograd is a 68 y.o. female seen for follow up of Acute on Chronic Respiratory Failure.  Patient is comfortable right now without distress is on pressure support wean with a goal of 8 hours today  Medications: Reviewed on Rounds  Physical Exam:  Vitals: Temperature 96.8 pulse 80 respiratory 18 blood pressure 153/87 saturations 98%  Ventilator Settings mode of ventilation pressure support FiO2 40% tidal volume 373 PEEP is 7 pressure support 12  . General: Comfortable at this time . Eyes: Grossly normal lids, irises & conjunctiva . ENT: grossly tongue is normal . Neck: no obvious mass . Cardiovascular: S1 S2 normal no gallop . Respiratory: No rhonchi or rales are noted at this time . Abdomen: soft . Skin: no rash seen on limited exam . Musculoskeletal: not rigid . Psychiatric:unable to assess . Neurologic: no seizure no involuntary movements         Lab Data:   Basic Metabolic Panel: Recent Labs  Lab 06/25/18 0742 06/28/18 0614  NA 145 145  K 4.0 3.3*  CL 105 104  CO2 29 33*  GLUCOSE 115* 124*  BUN 72* 55*  CREATININE 1.38* 0.97  CALCIUM 8.7* 8.5*    ABG: No results for input(s): PHART, PCO2ART, PO2ART, HCO3, O2SAT in the last 168 hours.  Liver Function Tests: No results for input(s): AST, ALT, ALKPHOS, BILITOT, PROT, ALBUMIN in the last 168 hours. No results for input(s): LIPASE, AMYLASE in the last 168 hours. No results for input(s): AMMONIA in the last 168 hours.  CBC: Recent Labs  Lab 06/28/18 0614  WBC 12.4*  HGB 10.0*  HCT 34.4*  MCV 89.1  PLT 747*    Cardiac Enzymes: No results for input(s): CKTOTAL, CKMB, CKMBINDEX, TROPONINI in the last 168 hours.  BNP (last 3 results) No  results for input(s): BNP in the last 8760 hours.  ProBNP (last 3 results) No results for input(s): PROBNP in the last 8760 hours.  Radiological Exams: Ct Abdomen Wo Contrast  Result Date: 06/29/2018 CLINICAL DATA:  Respiratory failure and evaluation of anatomy prior to possible percutaneous gastrostomy tube placement. EXAM: CT ABDOMEN WITHOUT CONTRAST TECHNIQUE: Multidetector CT imaging of the abdomen was performed following the standard protocol without IV contrast. COMPARISON:  None. FINDINGS: Lower chest: Prominent atelectasis and consolidation of both posterior lower lobes with associated small bilateral pleural effusions. Hepatobiliary: No focal liver abnormality is seen. Status post cholecystectomy. No biliary dilatation. Pancreas: Unremarkable. No pancreatic ductal dilatation or surrounding inflammatory changes. Spleen: Normal in size without focal abnormality. Adrenals/Urinary Tract: Adrenal glands are unremarkable. Kidneys are normal, without renal calculi, focal lesion, or hydronephrosis. Stomach/Bowel: Feeding tube extends into the distal stomach. There is a moderate-sized hiatal hernia. The proximal stomach is posterior to the left lobe of the liver. The mid to distal stomach is posterior to the transverse colon. Anatomy is very unfavorable for percutaneous gastrostomy tube placement which would be of high risk for colonic or liver injury. Bowel shows no evidence obstruction or significant ileus. No free air identified. No bowel inflammation or focal lesions identified without contrast. Vascular/Lymphatic: No significant vascular findings are present. No enlarged lymph nodes. Other: No evidence of hernia or abnormal fluid collection. Musculoskeletal: No acute or significant osseous findings. IMPRESSION: 1. Combination of  moderate-sized hiatal hernia and gastric positioning behind the left lobe of the liver and transverse colon makes anatomy prohibitive to safe percutaneous gastrostomy tube  placement. 2. Atelectasis and consolidation of both posterior lower lobes with associated small bilateral pleural effusions. Electronically Signed   By: Irish Lack M.D.   On: 06/29/2018 17:12    Assessment/Plan Active Problems:   Acute on chronic respiratory failure with hypoxia (HCC)   Severe sepsis with septic shock (HCC)   Chronic congestive heart failure (HCC)   AF (paroxysmal atrial fibrillation) (HCC)   1. Acute on chronic respiratory failure with hypoxia at this time patient is going to continue with the wean 8-hour goal as noted above. 2. Severe sepsis improving we will continue with supportive care 3. Chronic congestive heart failure at baseline 4. Atrial fibrillation rate is controlled   I have personally seen and evaluated the patient, evaluated laboratory and imaging results, formulated the assessment and plan and placed orders. The Patient requires high complexity decision making for assessment and support.  Case was discussed on Rounds with the Respiratory Therapy Staff  Yevonne Pax, MD [facility] Pulmonary Critical Care Medicine Sleep Medicine

## 2018-06-30 NOTE — Progress Notes (Signed)
  CT for G tube consideration yesterday:  IMPRESSION: 1. Combination of moderate-sized hiatal hernia and gastric positioning behind the left lobe of the liver and transverse colon makes anatomy prohibitive to safe percutaneous gastrostomy tube placement. 2. Atelectasis and consolidation of both posterior lower lobes with associated small bilateral pleural effusions.  Will inform ordering MD We will not move forward with IR G tube placement Consider Surgical consultation

## 2018-07-01 DIAGNOSIS — J9621 Acute and chronic respiratory failure with hypoxia: Secondary | ICD-10-CM | POA: Diagnosis not present

## 2018-07-01 DIAGNOSIS — A419 Sepsis, unspecified organism: Secondary | ICD-10-CM | POA: Diagnosis not present

## 2018-07-01 DIAGNOSIS — I480 Paroxysmal atrial fibrillation: Secondary | ICD-10-CM | POA: Diagnosis not present

## 2018-07-01 DIAGNOSIS — I509 Heart failure, unspecified: Secondary | ICD-10-CM | POA: Diagnosis not present

## 2018-07-01 NOTE — Progress Notes (Signed)
Pulmonary Critical Care Medicine [facility] [place]   PULMONARY CRITICAL CARE SERVICE  PROGRESS NOTE  Date of Service: 07/01/2018  Kelsey Jensen  YNW:[id number]  DOB: March 08, 1951   DOA: 06/28/2018  Referring Physician: Carron Curie, MD  HPI: Kelsey Jensen is a 68 y.o. female seen for follow up of Acute on Chronic Respiratory Failure.  Patient is on pressure support the goal is for about 12 hours on the 20  Medications: Reviewed on Rounds  Physical Exam:  Vitals: Temperature 98.8 pulse 117 respiratory 24 blood pressure 178/89 saturations are 98%  Ventilator Settings mode of ventilation pressure support FiO2 40% tidal volume 424 pressure support 12 PEEP 5  . General: Comfortable at this time . Eyes: Grossly normal lids, irises & conjunctiva . ENT: grossly tongue is normal . Neck: no obvious mass . Cardiovascular: S1 S2 normal no gallop . Respiratory: No rhonchi or rales are noted . Abdomen: soft . Skin: no rash seen on limited exam . Musculoskeletal: not rigid . Psychiatric:unable to assess . Neurologic: no seizure no involuntary movements         Lab Data:   Basic Metabolic Panel: Recent Labs  Lab 06/25/18 0742 06/28/18 0614  NA 145 145  K 4.0 3.3*  CL 105 104  CO2 29 33*  GLUCOSE 115* 124*  BUN 72* 55*  CREATININE 1.38* 0.97  CALCIUM 8.7* 8.5*    ABG: No results for input(s): PHART, PCO2ART, PO2ART, HCO3, O2SAT in the last 168 hours.  Liver Function Tests: No results for input(s): AST, ALT, ALKPHOS, BILITOT, PROT, ALBUMIN in the last 168 hours. No results for input(s): LIPASE, AMYLASE in the last 168 hours. No results for input(s): AMMONIA in the last 168 hours.  CBC: Recent Labs  Lab 06/28/18 0614  WBC 12.4*  HGB 10.0*  HCT 34.4*  MCV 89.1  PLT 747*    Cardiac Enzymes: No results for input(s): CKTOTAL, CKMB, CKMBINDEX, TROPONINI in the last 168 hours.  BNP (last 3 results) No results for input(s): BNP in the last 8760  hours.  ProBNP (last 3 results) No results for input(s): PROBNP in the last 8760 hours.  Radiological Exams: Ct Abdomen Wo Contrast  Result Date: 06/29/2018 CLINICAL DATA:  Respiratory failure and evaluation of anatomy prior to possible percutaneous gastrostomy tube placement. EXAM: CT ABDOMEN WITHOUT CONTRAST TECHNIQUE: Multidetector CT imaging of the abdomen was performed following the standard protocol without IV contrast. COMPARISON:  None. FINDINGS: Lower chest: Prominent atelectasis and consolidation of both posterior lower lobes with associated small bilateral pleural effusions. Hepatobiliary: No focal liver abnormality is seen. Status post cholecystectomy. No biliary dilatation. Pancreas: Unremarkable. No pancreatic ductal dilatation or surrounding inflammatory changes. Spleen: Normal in size without focal abnormality. Adrenals/Urinary Tract: Adrenal glands are unremarkable. Kidneys are normal, without renal calculi, focal lesion, or hydronephrosis. Stomach/Bowel: Feeding tube extends into the distal stomach. There is a moderate-sized hiatal hernia. The proximal stomach is posterior to the left lobe of the liver. The mid to distal stomach is posterior to the transverse colon. Anatomy is very unfavorable for percutaneous gastrostomy tube placement which would be of high risk for colonic or liver injury. Bowel shows no evidence obstruction or significant ileus. No free air identified. No bowel inflammation or focal lesions identified without contrast. Vascular/Lymphatic: No significant vascular findings are present. No enlarged lymph nodes. Other: No evidence of hernia or abnormal fluid collection. Musculoskeletal: No acute or significant osseous findings. IMPRESSION: 1. Combination of moderate-sized hiatal hernia and gastric positioning behind  the left lobe of the liver and transverse colon makes anatomy prohibitive to safe percutaneous gastrostomy tube placement. 2. Atelectasis and consolidation of  both posterior lower lobes with associated small bilateral pleural effusions. Electronically Signed   By: Irish LackGlenn  Yamagata M.D.   On: 06/29/2018 17:12    Assessment/Plan Active Problems:   Acute on chronic respiratory failure with hypoxia (HCC)   Severe sepsis with septic shock (HCC)   Chronic congestive heart failure (HCC)   AF (paroxysmal atrial fibrillation) (HCC)   1. Acute on chronic respiratory failure with hypoxia continue with the wean on pressure support as tolerated.  Patient currently is on 40% FiO2 we will titrate down as tolerated 2. Severe sepsis with shock hemodynamically improved we will continue to monitor 3. Chronic congestive heart failure continue with fluid monitoring and management. 4. Chronic atrial fibrillation paroxysmal rate is controlled at this time   I have personally seen and evaluated the patient, evaluated laboratory and imaging results, formulated the assessment and plan and placed orders. The Patient requires high complexity decision making for assessment and support.  Case was discussed on Rounds with the Respiratory Therapy Staff  Yevonne PaxSaadat A Khan, MD [facility] Pulmonary Critical Care Medicine Sleep Medicine

## 2018-07-02 DIAGNOSIS — A419 Sepsis, unspecified organism: Secondary | ICD-10-CM | POA: Diagnosis not present

## 2018-07-02 DIAGNOSIS — J9621 Acute and chronic respiratory failure with hypoxia: Secondary | ICD-10-CM | POA: Diagnosis not present

## 2018-07-02 DIAGNOSIS — I509 Heart failure, unspecified: Secondary | ICD-10-CM | POA: Diagnosis not present

## 2018-07-02 DIAGNOSIS — I480 Paroxysmal atrial fibrillation: Secondary | ICD-10-CM | POA: Diagnosis not present

## 2018-07-02 LAB — CBC
HCT: 32.3 % — ABNORMAL LOW (ref 36.0–46.0)
Hemoglobin: 9.2 g/dL — ABNORMAL LOW (ref 12.0–15.0)
MCH: 25.2 pg — ABNORMAL LOW (ref 26.0–34.0)
MCHC: 28.5 g/dL — ABNORMAL LOW (ref 30.0–36.0)
MCV: 88.5 fL (ref 80.0–100.0)
Platelets: 411 10*3/uL — ABNORMAL HIGH (ref 150–400)
RBC: 3.65 MIL/uL — ABNORMAL LOW (ref 3.87–5.11)
RDW: 20.4 % — ABNORMAL HIGH (ref 11.5–15.5)
WBC: 16 10*3/uL — ABNORMAL HIGH (ref 4.0–10.5)
nRBC: 0 % (ref 0.0–0.2)

## 2018-07-02 LAB — BASIC METABOLIC PANEL
Anion gap: 9 (ref 5–15)
BUN: 26 mg/dL — ABNORMAL HIGH (ref 8–23)
CO2: 30 mmol/L (ref 22–32)
CREATININE: 0.84 mg/dL (ref 0.44–1.00)
Calcium: 8.7 mg/dL — ABNORMAL LOW (ref 8.9–10.3)
Chloride: 103 mmol/L (ref 98–111)
GFR calc Af Amer: >60 mL/min (ref >60)
Glucose, Bld: 129 mg/dL — ABNORMAL HIGH (ref 70–99)
Potassium: 3.7 mmol/L (ref 3.5–5.1)
Sodium: 142 mmol/L (ref 135–145)

## 2018-07-02 NOTE — Progress Notes (Signed)
Pulmonary Critical Care Medicine [facility] [place]   PULMONARY CRITICAL CARE SERVICE  PROGRESS NOTE  Date of Service: 07/02/2018  Kelsey Jensen  IOM:[id number]  DOB: 05/23/1951   DOA: 06/28/2018  Referring Physician: Carron Curie, MD  HPI: Kelsey Jensen is a 68 y.o. female seen for follow up of Acute on Chronic Respiratory Failure.  Patient is weaning she is on pressure support mode currently the goal is for about 16 hours looks good so far  Medications: Reviewed on Rounds  Physical Exam:  Vitals: Temperature 97.3 pulse 97 respiratory 17 blood pressure 142/85 saturations 99%  Ventilator Settings mode ventilation pressure support FiO2 45% tidal volume 399 pressure support 12 PEEP 6  . General: Comfortable at this time . Eyes: Grossly normal lids, irises & conjunctiva . ENT: grossly tongue is normal . Neck: no obvious mass . Cardiovascular: S1 S2 normal no gallop . Respiratory: No rhonchi no rales are noted at this time . Abdomen: soft . Skin: no rash seen on limited exam . Musculoskeletal: not rigid . Psychiatric:unable to assess . Neurologic: no seizure no involuntary movements         Lab Data:   Basic Metabolic Panel: Recent Labs  Lab 06/28/18 0614 07/02/18 0538  NA 145 142  K 3.3* 3.7  CL 104 103  CO2 33* 30  GLUCOSE 124* 129*  BUN 55* 26*  CREATININE 0.97 0.84  CALCIUM 8.5* 8.7*    ABG: No results for input(s): PHART, PCO2ART, PO2ART, HCO3, O2SAT in the last 168 hours.  Liver Function Tests: No results for input(s): AST, ALT, ALKPHOS, BILITOT, PROT, ALBUMIN in the last 168 hours. No results for input(s): LIPASE, AMYLASE in the last 168 hours. No results for input(s): AMMONIA in the last 168 hours.  CBC: Recent Labs  Lab 06/28/18 0614 07/02/18 0538  WBC 12.4* 16.0*  HGB 10.0* 9.2*  HCT 34.4* 32.3*  MCV 89.1 88.5  PLT 747* 411*    Cardiac Enzymes: No results for input(s): CKTOTAL, CKMB, CKMBINDEX, TROPONINI in the last 168  hours.  BNP (last 3 results) No results for input(s): BNP in the last 8760 hours.  ProBNP (last 3 results) No results for input(s): PROBNP in the last 8760 hours.  Radiological Exams: No results found.  Assessment/Plan Active Problems:   Acute on chronic respiratory failure with hypoxia (HCC)   Severe sepsis with septic shock (HCC)   Chronic congestive heart failure (HCC)   AF (paroxysmal atrial fibrillation) (HCC)   1. Acute on chronic respiratory failure with hypoxia weaning on pressure support doing well good volumes are noted.  The goal is for 16 hours on the pressure support. 2. Severe sepsis with shock hemodynamically stable we will continue with present therapy 3. Chronic congestive heart failure compensated continue with present management 4. Atrial fibrillation currently rate is controlled we will continue to monitor   I have personally seen and evaluated the patient, evaluated laboratory and imaging results, formulated the assessment and plan and placed orders. The Patient requires high complexity decision making for assessment and support.  Case was discussed on Rounds with the Respiratory Therapy Staff  Yevonne Pax, MD [facility] Pulmonary Critical Care Medicine Sleep Medicine

## 2018-07-03 ENCOUNTER — Other Ambulatory Visit (HOSPITAL_COMMUNITY): Payer: Medicare Other

## 2018-07-03 DIAGNOSIS — A419 Sepsis, unspecified organism: Secondary | ICD-10-CM | POA: Diagnosis not present

## 2018-07-03 DIAGNOSIS — I509 Heart failure, unspecified: Secondary | ICD-10-CM | POA: Diagnosis not present

## 2018-07-03 DIAGNOSIS — J9621 Acute and chronic respiratory failure with hypoxia: Secondary | ICD-10-CM | POA: Diagnosis not present

## 2018-07-03 DIAGNOSIS — I480 Paroxysmal atrial fibrillation: Secondary | ICD-10-CM | POA: Diagnosis not present

## 2018-07-03 LAB — CBC
HEMATOCRIT: 34 % — AB (ref 36.0–46.0)
Hemoglobin: 10 g/dL — ABNORMAL LOW (ref 12.0–15.0)
MCH: 25.8 pg — ABNORMAL LOW (ref 26.0–34.0)
MCHC: 29.4 g/dL — ABNORMAL LOW (ref 30.0–36.0)
MCV: 87.9 fL (ref 80.0–100.0)
Platelets: 431 10*3/uL — ABNORMAL HIGH (ref 150–400)
RBC: 3.87 MIL/uL (ref 3.87–5.11)
RDW: 20.5 % — ABNORMAL HIGH (ref 11.5–15.5)
WBC: 18.9 10*3/uL — ABNORMAL HIGH (ref 4.0–10.5)
nRBC: 0 % (ref 0.0–0.2)

## 2018-07-03 LAB — URINALYSIS, ROUTINE W REFLEX MICROSCOPIC
Bacteria, UA: NONE SEEN
Bilirubin Urine: NEGATIVE
Glucose, UA: NEGATIVE mg/dL
Ketones, ur: NEGATIVE mg/dL
Nitrite: POSITIVE — AB
Protein, ur: 30 mg/dL — AB
Specific Gravity, Urine: 1.015 (ref 1.005–1.030)
pH: 7 (ref 5.0–8.0)

## 2018-07-03 NOTE — Progress Notes (Signed)
Pulmonary Critical Care Medicine [facility] [place]   PULMONARY CRITICAL CARE SERVICE  PROGRESS NOTE  Date of Service: 07/03/2018  Kelsey Jensen  NKN:[id number]  DOB: 06/16/1951   DOA: 06/28/2018  Referring Physician: Carron Curie, MD  HPI: Kelsey Jensen is a 68 y.o. female seen for follow up of Acute on Chronic Respiratory Failure.  Patient is on full support she is supposed to go on a wean and pressure support mode today  Medications: Reviewed on Rounds  Physical Exam:  Vitals: Temperature 98.1 pulse 103 respiratory rate 21 blood pressure 164/91 saturations 98%  Ventilator Settings mode ventilation assist control FiO2 35% tidal volume 576 PEEP 6  . General: Comfortable at this time . Eyes: Grossly normal lids, irises & conjunctiva . ENT: grossly tongue is normal . Neck: no obvious mass . Cardiovascular: S1 S2 normal no gallop . Respiratory: No rhonchi or rales are noted at this time . Abdomen: soft . Skin: no rash seen on limited exam . Musculoskeletal: not rigid . Psychiatric:unable to assess . Neurologic: no seizure no involuntary movements         Lab Data:   Basic Metabolic Panel: Recent Labs  Lab 06/28/18 0614 07/02/18 0538  NA 145 142  K 3.3* 3.7  CL 104 103  CO2 33* 30  GLUCOSE 124* 129*  BUN 55* 26*  CREATININE 0.97 0.84  CALCIUM 8.5* 8.7*    ABG: No results for input(s): PHART, PCO2ART, PO2ART, HCO3, O2SAT in the last 168 hours.  Liver Function Tests: No results for input(s): AST, ALT, ALKPHOS, BILITOT, PROT, ALBUMIN in the last 168 hours. No results for input(s): LIPASE, AMYLASE in the last 168 hours. No results for input(s): AMMONIA in the last 168 hours.  CBC: Recent Labs  Lab 06/28/18 0614 07/02/18 0538 07/03/18 0653  WBC 12.4* 16.0* 18.9*  HGB 10.0* 9.2* 10.0*  HCT 34.4* 32.3* 34.0*  MCV 89.1 88.5 87.9  PLT 747* 411* 431*    Cardiac Enzymes: No results for input(s): CKTOTAL, CKMB, CKMBINDEX, TROPONINI in the last  168 hours.  BNP (last 3 results) No results for input(s): BNP in the last 8760 hours.  ProBNP (last 3 results) No results for input(s): PROBNP in the last 8760 hours.  Radiological Exams: No results found.  Assessment/Plan Active Problems:   Acute on chronic respiratory failure with hypoxia (HCC)   Severe sepsis with septic shock (HCC)   Chronic congestive heart failure (HCC)   AF (paroxysmal atrial fibrillation) (HCC)   1. Acute on chronic respiratory failure with hypoxia we will continue with full support on assist control mode right now is on 35% FiO2 tidal volume is 576 with a PEEP of 6 we will continue with weaning which will be started today have spoken with respiratory therapy 2. Severe sepsis resolved we will continue with present management 3. Chronic congestive heart failure monitor fluid status 4. Chronic atrial fibrillation paroxysmal rate is controlled at this time   I have personally seen and evaluated the patient, evaluated laboratory and imaging results, formulated the assessment and plan and placed orders. The Patient requires high complexity decision making for assessment and support.  Case was discussed on Rounds with the Respiratory Therapy Staff  Yevonne Pax, MD [facility] Pulmonary Critical Care Medicine Sleep Medicine

## 2018-07-04 DIAGNOSIS — A419 Sepsis, unspecified organism: Secondary | ICD-10-CM | POA: Diagnosis not present

## 2018-07-04 DIAGNOSIS — I509 Heart failure, unspecified: Secondary | ICD-10-CM | POA: Diagnosis not present

## 2018-07-04 DIAGNOSIS — J9621 Acute and chronic respiratory failure with hypoxia: Secondary | ICD-10-CM | POA: Diagnosis not present

## 2018-07-04 DIAGNOSIS — I480 Paroxysmal atrial fibrillation: Secondary | ICD-10-CM | POA: Diagnosis not present

## 2018-07-04 LAB — CBC WITH DIFFERENTIAL/PLATELET
Abs Immature Granulocytes: 0.26 10*3/uL — ABNORMAL HIGH (ref 0.00–0.07)
Basophils Absolute: 0 10*3/uL (ref 0.0–0.1)
Basophils Relative: 0 %
Eosinophils Absolute: 0.1 10*3/uL (ref 0.0–0.5)
Eosinophils Relative: 0 %
HCT: 34.6 % — ABNORMAL LOW (ref 36.0–46.0)
Hemoglobin: 10.2 g/dL — ABNORMAL LOW (ref 12.0–15.0)
Immature Granulocytes: 2 %
LYMPHS PCT: 5 %
Lymphs Abs: 0.7 10*3/uL (ref 0.7–4.0)
MCH: 25.6 pg — AB (ref 26.0–34.0)
MCHC: 29.5 g/dL — ABNORMAL LOW (ref 30.0–36.0)
MCV: 86.7 fL (ref 80.0–100.0)
Monocytes Absolute: 1 10*3/uL (ref 0.1–1.0)
Monocytes Relative: 7 %
Neutro Abs: 13.5 10*3/uL — ABNORMAL HIGH (ref 1.7–7.7)
Neutrophils Relative %: 86 %
Platelets: 347 10*3/uL (ref 150–400)
RBC: 3.99 MIL/uL (ref 3.87–5.11)
RDW: 20.5 % — ABNORMAL HIGH (ref 11.5–15.5)
WBC: 15.7 10*3/uL — ABNORMAL HIGH (ref 4.0–10.5)
nRBC: 0 % (ref 0.0–0.2)

## 2018-07-04 NOTE — Progress Notes (Signed)
Pulmonary Critical Care Medicine [facility] [place]   PULMONARY CRITICAL CARE SERVICE  PROGRESS NOTE  Date of Service: 07/04/2018  Kelsey Jensen  [id number]:[id number]  DOB: 27-Feb-1951   DOA: 06/28/2018  Referring Physician: Carron Curie, MD  HPI: Kelsey Jensen is a 68 y.o. female seen for follow up of Acute on Chronic Respiratory Failure.  Patient is on T collar she is comfortable and without distress has been tolerating the wean well  Medications: Reviewed on Rounds  Physical Exam:  Vitals: Temperature 98.8 pulse 86 respiratory 22 blood pressure 135/70 saturations 98%  Ventilator Settings off the ventilator on T collar right now  . General: Comfortable at this time . Eyes: Grossly normal lids, irises & conjunctiva . ENT: grossly tongue is normal . Neck: no obvious mass . Cardiovascular: S1 S2 normal no gallop . Respiratory: No rhonchi or rales are noted . Abdomen: soft . Skin: no rash seen on limited exam . Musculoskeletal: not rigid . Psychiatric:unable to assess . Neurologic: no seizure no involuntary movements         Lab Data:   Basic Metabolic Panel: Recent Labs  Lab 06/28/18 0614 07/02/18 0538  NA 145 142  K 3.3* 3.7  CL 104 103  CO2 33* 30  GLUCOSE 124* 129*  BUN 55* 26*  CREATININE 0.97 0.84  CALCIUM 8.5* 8.7*    ABG: No results for input(s): PHART, PCO2ART, PO2ART, HCO3, O2SAT in the last 168 hours.  Liver Function Tests: No results for input(s): AST, ALT, ALKPHOS, BILITOT, PROT, ALBUMIN in the last 168 hours. No results for input(s): LIPASE, AMYLASE in the last 168 hours. No results for input(s): AMMONIA in the last 168 hours.  CBC: Recent Labs  Lab 06/28/18 0614 07/02/18 0538 07/03/18 0653 07/04/18 0542  WBC 12.4* 16.0* 18.9* 15.7*  NEUTROABS  --   --   --  13.5*  HGB 10.0* 9.2* 10.0* 10.2*  HCT 34.4* 32.3* 34.0* 34.6*  MCV 89.1 88.5 87.9 86.7  PLT 747* 411* 431* 347    Cardiac Enzymes: No results for input(s): CKTOTAL,  CKMB, CKMBINDEX, TROPONINI in the last 168 hours.  BNP (last 3 results) No results for input(s): BNP in the last 8760 hours.  ProBNP (last 3 results) No results for input(s): PROBNP in the last 8760 hours.  Radiological Exams: Dg Chest Port 1 View  Result Date: 07/03/2018 CLINICAL DATA:  Fever, tracheostomy EXAM: PORTABLE CHEST 1 VIEW COMPARISON:  06/28/2018 FINDINGS: Interval tracheostomy, 6 cm above the carina. Feeding tube extends below the hemidiaphragms into the stomach with the tip not visualized. Artifact overlies the chest. Heart remains enlarged with vascular congestion. Bibasilar collapse/consolidation with small effusions again noted, similar to the previous CT and chest x-ray. Upper lobes remain clear. No pneumothorax. Aorta atherosclerotic. IMPRESSION: Similar cardiomegaly and vascular congestion Persistent bibasilar collapse/consolidation and small effusions. Electronically Signed   By: Judie Petit.  Shick M.D.   On: 07/03/2018 14:10    Assessment/Plan Active Problems:   Acute on chronic respiratory failure with hypoxia (HCC)   Severe sepsis with septic shock (HCC)   Chronic congestive heart failure (HCC)   AF (paroxysmal atrial fibrillation) (HCC)   1. Acute on chronic respiratory failure with hypoxia we will continue with the T collar trials continue pulmonary toilet supportive care 2. Severe sepsis with shock hemodynamically stable at this time 3. Chronic congestive heart failure at baseline 4. Atrial fibrillation rate controlled we will continue to follow   I have personally seen and evaluated the patient,  evaluated laboratory and imaging results, formulated the assessment and plan and placed orders. The Patient requires high complexity decision making for assessment and support.  Case was discussed on Rounds with the Respiratory Therapy Staff  Allyne Gee, MD [facility] Pulmonary Critical Care Medicine Sleep Medicine

## 2018-07-05 DIAGNOSIS — A419 Sepsis, unspecified organism: Secondary | ICD-10-CM | POA: Diagnosis not present

## 2018-07-05 DIAGNOSIS — I480 Paroxysmal atrial fibrillation: Secondary | ICD-10-CM | POA: Diagnosis not present

## 2018-07-05 DIAGNOSIS — I509 Heart failure, unspecified: Secondary | ICD-10-CM | POA: Diagnosis not present

## 2018-07-05 DIAGNOSIS — J9621 Acute and chronic respiratory failure with hypoxia: Secondary | ICD-10-CM | POA: Diagnosis not present

## 2018-07-05 NOTE — Progress Notes (Signed)
Pulmonary Critical Care Medicine [facility] [place]   PULMONARY CRITICAL CARE SERVICE  PROGRESS NOTE  Date of Service: 07/05/2018  Kelsey Jensen  XBJ:[id number]  DOB: Mar 20, 1951   DOA: 06/28/2018  Referring Physician: Carron Curie, MD  HPI: Kelsey Jensen is a 68 y.o. female seen for follow up of Acute on Chronic Respiratory Failure.  Patient is on pressure support wean was able to complete 4 hours of T collar wean today.  Medications: Reviewed on Rounds  Physical Exam:  Vitals: Temperature 97.6 pulse 96 respiratory rate is 18 blood pressure 118/67 saturations 96%  Ventilator Settings mode ventilation pressure support FiO2 35% tidal volume 301 pressure support 12 PEEP 5  . General: Comfortable at this time . Eyes: Grossly normal lids, irises & conjunctiva . ENT: grossly tongue is normal . Neck: no obvious mass . Cardiovascular: S1 S2 normal no gallop . Respiratory: No rhonchi or rales are noted at this time . Abdomen: soft . Skin: no rash seen on limited exam . Musculoskeletal: not rigid . Psychiatric:unable to assess . Neurologic: no seizure no involuntary movements         Lab Data:   Basic Metabolic Panel: Recent Labs  Lab 07/02/18 0538  NA 142  K 3.7  CL 103  CO2 30  GLUCOSE 129*  BUN 26*  CREATININE 0.84  CALCIUM 8.7*    ABG: No results for input(s): PHART, PCO2ART, PO2ART, HCO3, O2SAT in the last 168 hours.  Liver Function Tests: No results for input(s): AST, ALT, ALKPHOS, BILITOT, PROT, ALBUMIN in the last 168 hours. No results for input(s): LIPASE, AMYLASE in the last 168 hours. No results for input(s): AMMONIA in the last 168 hours.  CBC: Recent Labs  Lab 07/02/18 0538 07/03/18 0653 07/04/18 0542  WBC 16.0* 18.9* 15.7*  NEUTROABS  --   --  13.5*  HGB 9.2* 10.0* 10.2*  HCT 32.3* 34.0* 34.6*  MCV 88.5 87.9 86.7  PLT 411* 431* 347    Cardiac Enzymes: No results for input(s): CKTOTAL, CKMB, CKMBINDEX, TROPONINI in the last  168 hours.  BNP (last 3 results) No results for input(s): BNP in the last 8760 hours.  ProBNP (last 3 results) No results for input(s): PROBNP in the last 8760 hours.  Radiological Exams: No results found.  Assessment/Plan Active Problems:   Acute on chronic respiratory failure with hypoxia (HCC)   Severe sepsis with septic shock (HCC)   Chronic congestive heart failure (HCC)   AF (paroxysmal atrial fibrillation) (HCC)   1. Acute on chronic respiratory failure with hypoxia patient will continue to wean did 4 hours on T collar as already mentioned. 2. Severe sepsis shock hemodynamically stable 3. Chronic congestive heart failure resolved continue with present management 4. Atrial fibrillation rate controlled continue to monitor   I have personally seen and evaluated the patient, evaluated laboratory and imaging results, formulated the assessment and plan and placed orders. The Patient requires high complexity decision making for assessment and support.  Case was discussed on Rounds with the Respiratory Therapy Staff  Kelsey Pax, MD [facility] Pulmonary Critical Care Medicine Sleep Medicine

## 2018-07-06 DIAGNOSIS — J9621 Acute and chronic respiratory failure with hypoxia: Secondary | ICD-10-CM | POA: Diagnosis not present

## 2018-07-06 DIAGNOSIS — I480 Paroxysmal atrial fibrillation: Secondary | ICD-10-CM | POA: Diagnosis not present

## 2018-07-06 DIAGNOSIS — A419 Sepsis, unspecified organism: Secondary | ICD-10-CM | POA: Diagnosis not present

## 2018-07-06 DIAGNOSIS — I509 Heart failure, unspecified: Secondary | ICD-10-CM | POA: Diagnosis not present

## 2018-07-06 LAB — URINE CULTURE: Culture: >=100000 — AB

## 2018-07-06 NOTE — Progress Notes (Signed)
Pulmonary Critical Care Medicine [facility] [place]   PULMONARY CRITICAL CARE SERVICE  PROGRESS NOTE  Date of Service: 07/06/2018  Kelsey Jensen  [id number]  DOB: Mar 07, 1951   DOA: 06/28/2018  Referring Physician: Carron Curie, MD  HPI: Kelsey Jensen is a 68 y.o. female seen for follow up of Acute on Chronic Respiratory Failure.  Currently is on T collar with 28% FiO2 8-hour goal PMV is doing well  Medications: Reviewed on Rounds  Physical Exam:  Vitals: Temperature 97.0 pulse 110 respiratory 24 blood pressure 148/100 saturation 99%  Ventilator Settings off ventilator on T collar at this time with a goal of 8 hours  . General: Comfortable at this time . Eyes: Grossly normal lids, irises & conjunctiva . ENT: grossly tongue is normal . Neck: no obvious mass . Cardiovascular: S1 S2 normal no gallop . Respiratory: No rhonchi or rales are noted at this time . Abdomen: soft . Skin: no rash seen on limited exam . Musculoskeletal: not rigid . Psychiatric:unable to assess . Neurologic: no seizure no involuntary movements         Lab Data:   Basic Metabolic Panel: Recent Labs  Lab 07/02/18 0538  NA 142  K 3.7  CL 103  CO2 30  GLUCOSE 129*  BUN 26*  CREATININE 0.84  CALCIUM 8.7*    ABG: No results for input(s): PHART, PCO2ART, PO2ART, HCO3, O2SAT in the last 168 hours.  Liver Function Tests: No results for input(s): AST, ALT, ALKPHOS, BILITOT, PROT, ALBUMIN in the last 168 hours. No results for input(s): LIPASE, AMYLASE in the last 168 hours. No results for input(s): AMMONIA in the last 168 hours.  CBC: Recent Labs  Lab 07/02/18 0538 07/03/18 0653 07/04/18 0542  WBC 16.0* 18.9* 15.7*  NEUTROABS  --   --  13.5*  HGB 9.2* 10.0* 10.2*  HCT 32.3* 34.0* 34.6*  MCV 88.5 87.9 86.7  PLT 411* 431* 347    Cardiac Enzymes: No results for input(s): CKTOTAL, CKMB, CKMBINDEX, TROPONINI in the last 168 hours.  BNP (last 3 results) No results for  input(s): BNP in the last 8760 hours.  ProBNP (last 3 results) No results for input(s): PROBNP in the last 8760 hours.  Radiological Exams: No results found.  Assessment/Plan Active Problems:   Acute on chronic respiratory failure with hypoxia (HCC)   Severe sepsis with septic shock (HCC)   Chronic congestive heart failure (HCC)   AF (paroxysmal atrial fibrillation) (HCC)   1. Acute on chronic respiratory failure with hypoxia we will continue with T collar trials with a goal of 8 hours PMV is being used also 2. Severe sepsis with shock hemodynamically stable 3. Chronic congestive heart failure at baseline 4. Atrial fibrillation rate is controlled at this time   I have personally seen and evaluated the patient, evaluated laboratory and imaging results, formulated the assessment and plan and placed orders. The Patient requires high complexity decision making for assessment and support.  Case was discussed on Rounds with the Respiratory Therapy Staff  Yevonne Pax, MD [facility] Pulmonary Critical Care Medicine Sleep Medicine

## 2018-07-07 DIAGNOSIS — I480 Paroxysmal atrial fibrillation: Secondary | ICD-10-CM | POA: Diagnosis not present

## 2018-07-07 DIAGNOSIS — I509 Heart failure, unspecified: Secondary | ICD-10-CM | POA: Diagnosis not present

## 2018-07-07 DIAGNOSIS — J9621 Acute and chronic respiratory failure with hypoxia: Secondary | ICD-10-CM | POA: Diagnosis not present

## 2018-07-07 DIAGNOSIS — A419 Sepsis, unspecified organism: Secondary | ICD-10-CM | POA: Diagnosis not present

## 2018-07-07 LAB — BASIC METABOLIC PANEL
Anion gap: 11 (ref 5–15)
BUN: 25 mg/dL — ABNORMAL HIGH (ref 8–23)
CO2: 30 mmol/L (ref 22–32)
Calcium: 8.5 mg/dL — ABNORMAL LOW (ref 8.9–10.3)
Chloride: 94 mmol/L — ABNORMAL LOW (ref 98–111)
Creatinine, Ser: 0.64 mg/dL (ref 0.44–1.00)
GFR calc Af Amer: >60 mL/min (ref >60)
GFR calc non Af Amer: >60 mL/min (ref >60)
Glucose, Bld: 129 mg/dL — ABNORMAL HIGH (ref 70–99)
POTASSIUM: 3.2 mmol/L — AB (ref 3.5–5.1)
Sodium: 135 mmol/L (ref 135–145)

## 2018-07-07 NOTE — Progress Notes (Signed)
Pulmonary Critical Care Medicine [facility] [place]   PULMONARY CRITICAL CARE SERVICE  PROGRESS NOTE  Date of Service: 07/07/2018  Kelsey Jensen  [id number]  DOB: [birth year]   DOA: 06/28/2018  Referring Physician: Carron Curie, MD  HPI: Kelsey Jensen is a 68 y.o. female seen for follow up of Acute on Chronic Respiratory Failure.  Patient is on T collar at this time and has been on 28% FiO2 goal is for about 12 hours today  Medications: Reviewed on Rounds  Physical Exam:  Vitals: Temperature 98.6 pulse 100 respiratory 18 blood pressure 119/71 saturations 96%  Ventilator Settings currently is on T collar FiO2 28%  . General: Comfortable at this time . Eyes: Grossly normal lids, irises & conjunctiva . ENT: grossly tongue is normal . Neck: no obvious mass . Cardiovascular: S1 S2 normal no gallop . Respiratory: No rhonchi or rales are noted at this time . Abdomen: soft . Skin: no rash seen on limited exam . Musculoskeletal: not rigid . Psychiatric:unable to assess . Neurologic: no seizure no involuntary movements         Lab Data:   Basic Metabolic Panel: Recent Labs  Lab 07/02/18 0538 07/07/18 0728  NA 142 135  K 3.7 3.2*  CL 103 94*  CO2 30 30  GLUCOSE 129* 129*  BUN 26* 25*  CREATININE 0.84 0.64  CALCIUM 8.7* 8.5*    ABG: No results for input(s): PHART, PCO2ART, PO2ART, HCO3, O2SAT in the last 168 hours.  Liver Function Tests: No results for input(s): AST, ALT, ALKPHOS, BILITOT, PROT, ALBUMIN in the last 168 hours. No results for input(s): LIPASE, AMYLASE in the last 168 hours. No results for input(s): AMMONIA in the last 168 hours.  CBC: Recent Labs  Lab 07/02/18 0538 07/03/18 0653 07/04/18 0542  WBC 16.0* 18.9* 15.7*  NEUTROABS  --   --  13.5*  HGB 9.2* 10.0* 10.2*  HCT 32.3* 34.0* 34.6*  MCV 88.5 87.9 86.7  PLT 411* 431* 347    Cardiac Enzymes: No results for input(s): CKTOTAL, CKMB, CKMBINDEX, TROPONINI in the last 168  hours.  BNP (last 3 results) No results for input(s): BNP in the last 8760 hours.  ProBNP (last 3 results) No results for input(s): PROBNP in the last 8760 hours.  Radiological Exams: No results found.  Assessment/Plan Active Problems:   Acute on chronic respiratory failure with hypoxia (HCC)   Severe sepsis with septic shock (HCC)   Chronic congestive heart failure (HCC)   AF (paroxysmal atrial fibrillation) (HCC)   1. Acute on chronic respiratory failure with hypoxia continue with T collar titrate oxygen Continue secretion management pulmonary toilet. 2. Severe sepsis with shock hemodynamically stable 3. Chronic heart failure at this time is compensated 4. Atrial fibrillation rate controlled   I have personally seen and evaluated the patient, evaluated laboratory and imaging results, formulated the assessment and plan and placed orders. The Patient requires high complexity decision making for assessment and support.  Case was discussed on Rounds with the Respiratory Therapy Staff  Yevonne Pax, MD [facility] Pulmonary Critical Care Medicine Sleep Medicine

## 2018-07-08 DIAGNOSIS — I480 Paroxysmal atrial fibrillation: Secondary | ICD-10-CM | POA: Diagnosis not present

## 2018-07-08 DIAGNOSIS — A419 Sepsis, unspecified organism: Secondary | ICD-10-CM | POA: Diagnosis not present

## 2018-07-08 DIAGNOSIS — J9621 Acute and chronic respiratory failure with hypoxia: Secondary | ICD-10-CM | POA: Diagnosis not present

## 2018-07-08 DIAGNOSIS — I509 Heart failure, unspecified: Secondary | ICD-10-CM | POA: Diagnosis not present

## 2018-07-08 LAB — POTASSIUM: Potassium: 3.8 mmol/L (ref 3.5–5.1)

## 2018-07-08 NOTE — Progress Notes (Addendum)
Pulmonary Critical Care Medicine [facility] [place]   PULMONARY CRITICAL CARE SERVICE  PROGRESS NOTE  Date of Service: 07/08/2018  SAVITRI PETROW  [id number]:[id number]  DOB: [birth year]   DOA: 06/28/2018  Referring Physician: Carron Curie, MD  HPI: Kelsey Jensen is a 68 y.o. female seen for follow up of Acute on Chronic Respiratory Failure.  Patient remains on trach collar at 35% FiO2.  Patient is doing well her goal is 16 hours today.  Medications: Reviewed on Rounds  Physical Exam:  Vitals: Pulse 88 respirations 18 blood pressure 125/77 O2 sat 96% temp 98 point  Ventilator Settings patient's not currently on ventilator  . General: Comfortable at this time . Eyes: Grossly normal lids, irises & conjunctiva . ENT: grossly tongue is normal . Neck: no obvious mass . Cardiovascular: S1 S2 normal no gallop . Respiratory: Coarse breath sounds . Abdomen: soft . Skin: no rash seen on limited exam . Musculoskeletal: not rigid . Psychiatric:unable to assess . Neurologic: no seizure no involuntary movements         Lab Data:   Basic Metabolic Panel: Recent Labs  Lab 07/02/18 0538 07/07/18 0728  NA 142 135  K 3.7 3.2*  CL 103 94*  CO2 30 30  GLUCOSE 129* 129*  BUN 26* 25*  CREATININE 0.84 0.64  CALCIUM 8.7* 8.5*    ABG: No results for input(s): PHART, PCO2ART, PO2ART, HCO3, O2SAT in the last 168 hours.  Liver Function Tests: No results for input(s): AST, ALT, ALKPHOS, BILITOT, PROT, ALBUMIN in the last 168 hours. No results for input(s): LIPASE, AMYLASE in the last 168 hours. No results for input(s): AMMONIA in the last 168 hours.  CBC: Recent Labs  Lab 07/02/18 0538 07/03/18 0653 07/04/18 0542  WBC 16.0* 18.9* 15.7*  NEUTROABS  --   --  13.5*  HGB 9.2* 10.0* 10.2*  HCT 32.3* 34.0* 34.6*  MCV 88.5 87.9 86.7  PLT 411* 431* 347    Cardiac Enzymes: No results for input(s): CKTOTAL, CKMB, CKMBINDEX, TROPONINI in the last 168 hours.  BNP (last 3  results) No results for input(s): BNP in the last 8760 hours.  ProBNP (last 3 results) No results for input(s): PROBNP in the last 8760 hours.  Radiological Exams: No results found.  Assessment/Plan Active Problems:   Acute on chronic respiratory failure with hypoxia (HCC)   Severe sepsis with septic shock (HCC)   Chronic congestive heart failure (HCC)   AF (paroxysmal atrial fibrillation) (HCC)   1. Acute on chronic respiratory failure with hypoxia continue trach collar trials and weaning per protocol. 2. Severe sepsis with shock hemodynamically stable 3. Chronic heart failure at this time compensated 4. Atrial fibrillation rate controlled   I have personally seen and evaluated the patient, evaluated laboratory and imaging results, formulated the assessment and plan and placed orders. The Patient requires high complexity decision making for assessment and support.  Case was discussed on Rounds with the Respiratory Therapy Staff  Yevonne Pax, MD [facility] Pulmonary Critical Care Medicine Sleep Medicine

## 2018-07-09 DIAGNOSIS — A419 Sepsis, unspecified organism: Secondary | ICD-10-CM | POA: Diagnosis not present

## 2018-07-09 DIAGNOSIS — I509 Heart failure, unspecified: Secondary | ICD-10-CM | POA: Diagnosis not present

## 2018-07-09 DIAGNOSIS — J9621 Acute and chronic respiratory failure with hypoxia: Secondary | ICD-10-CM | POA: Diagnosis not present

## 2018-07-09 DIAGNOSIS — I480 Paroxysmal atrial fibrillation: Secondary | ICD-10-CM | POA: Diagnosis not present

## 2018-07-09 NOTE — Progress Notes (Signed)
[facility]ulmonary Critical [facility] [place]   PULMONARY CRITICAL CARE SERVICE  PROGRESS NOTE  Date of Service: 07/09/2018  Carolan ClinesBrenda T Rodenberg  ZOX:[id number]  DOB: 1951/05/22   DOA: 06/28/2018  Referring Physician: Carron CurieAli Hijazi, MD  HPI: Carolan ClinesBrenda T Fergeson is a 68 y.o. female seen for follow up of Acute on Chronic Respiratory Failure.  Patient is comfortable right now without distress has been on T collar right now is on 30% oxygen with a 20-hour goal  Medications: Reviewed on Rounds  Physical Exam:  Vitals: Temperature 97.0 pulse 81 respiratory 24 blood pressure 116/58 saturations 93%  Ventilator Settings currently on T collar FiO2 is 30%  . General: Comfortable at this time . Eyes: Grossly normal lids, irises & conjunctiva . ENT: grossly tongue is normal . Neck: no obvious mass . Cardiovascular: S1 S2 normal no gallop . Respiratory: Scattered rhonchi expansion equal . Abdomen: soft . Skin: no rash seen on limited exam . Musculoskeletal: not rigid . Psychiatric:unable to assess . Neurologic: no seizure no involuntary movements         Lab Data:   Basic Metabolic Panel: Recent Labs  Lab 07/07/18 0728 07/08/18 1856  NA 135  --   K 3.2* 3.8  CL 94*  --   CO2 30  --   GLUCOSE 129*  --   BUN 25*  --   CREATININE 0.64  --   CALCIUM 8.5*  --     ABG: No results for input(s): PHART, PCO2ART, PO2ART, HCO3, O2SAT in the last 168 hours.  Liver Function Tests: No results for input(s): AST, ALT, ALKPHOS, BILITOT, PROT, ALBUMIN in the last 168 hours. No results for input(s): LIPASE, AMYLASE in the last 168 hours. No results for input(s): AMMONIA in the last 168 hours.  CBC: Recent Labs  Lab 07/03/18 0653 07/04/18 0542  WBC 18.9* 15.7*  NEUTROABS  --  13.5*  HGB 10.0* 10.2*  HCT 34.0* 34.6*  MCV 87.9 86.7  PLT 431* 347    Cardiac Enzymes: No results for input(s): CKTOTAL, CKMB, CKMBINDEX, TROPONINI in the last 168 hours.  BNP (last 3  results) No results for input(s): BNP in the last 8760 hours.  ProBNP (last 3 results) No results for input(s): PROBNP in the last 8760 hours.  Radiological Exams: No results found.  Assessment/Plan Active Problems:   Acute on chronic respiratory failure with hypoxia (HCC)   Severe sepsis with septic shock (HCC)   Chronic congestive heart failure (HCC)   AF (paroxysmal atrial fibrillation) (HCC)   1. Acute on chronic respiratory failure with hypoxia we will continue with T collar titrate oxygen as mentioned above the goal is 20 hours 2. Severe sepsis resolved hemodynamically stable 3. Chronic congestive heart failure compensated 4. Atrial fibrillation rate controlled   I have personally seen and evaluated the patient, evaluated laboratory and imaging results, formulated the assessment and plan and placed orders. The Patient requires high complexity decision making for assessment and support.  Case was discussed on Rounds with the Respiratory Therapy Staff  Yevonne PaxSaadat A Khan, MD [facility] Pulmonary Critical Care Medicine Sleep Medicine

## 2018-07-10 DIAGNOSIS — I480 Paroxysmal atrial fibrillation: Secondary | ICD-10-CM | POA: Diagnosis not present

## 2018-07-10 DIAGNOSIS — I509 Heart failure, unspecified: Secondary | ICD-10-CM | POA: Diagnosis not present

## 2018-07-10 DIAGNOSIS — J9621 Acute and chronic respiratory failure with hypoxia: Secondary | ICD-10-CM | POA: Diagnosis not present

## 2018-07-10 DIAGNOSIS — A419 Sepsis, unspecified organism: Secondary | ICD-10-CM | POA: Diagnosis not present

## 2018-07-10 NOTE — Progress Notes (Addendum)
Pulmonary Critical Care Medicine [facility] [place]   PULMONARY CRITICAL CARE SERVICE  PROGRESS NOTE  Date of Service: 07/10/2018  Kelsey Jensen  [id number]  DOB: [birth year]   DOA: 06/28/2018  Referring Physician: Carron Curie, MD  HPI: Kelsey Jensen is a 68 y.o. female seen for follow up of Acute on Chronic Respiratory Failure.  Patient found resting in bed currently on trach collar at 30% FiO2.  Goal today is 24 hours and she is currently doing well with this.  She successfully completed 20 hours yesterday.  Medications: Reviewed on Rounds  Physical Exam:  Vitals: Pulse 90 respirations 17 blood pressure 117/78 O2 sat 100% temp 98.1  Ventilator Settings patient is not currently on ventilator  . General: Comfortable at this time . Eyes: Grossly normal lids, irises & conjunctiva . ENT: grossly tongue is normal . Neck: no obvious mass . Cardiovascular: S1 S2 normal no gallop . Respiratory: Coarse breath sounds . Abdomen: soft . Skin: no rash seen on limited exam . Musculoskeletal: not rigid . Psychiatric:unable to assess . Neurologic: no seizure no involuntary movements         Lab Data:   Basic Metabolic Panel: Recent Labs  Lab 07/07/18 0728 07/08/18 1856  NA 135  --   K 3.2* 3.8  CL 94*  --   CO2 30  --   GLUCOSE 129*  --   BUN 25*  --   CREATININE 0.64  --   CALCIUM 8.5*  --     ABG: No results for input(s): PHART, PCO2ART, PO2ART, HCO3, O2SAT in the last 168 hours.  Liver Function Tests: No results for input(s): AST, ALT, ALKPHOS, BILITOT, PROT, ALBUMIN in the last 168 hours. No results for input(s): LIPASE, AMYLASE in the last 168 hours. No results for input(s): AMMONIA in the last 168 hours.  CBC: Recent Labs  Lab 07/04/18 0542  WBC 15.7*  NEUTROABS 13.5*  HGB 10.2*  HCT 34.6*  MCV 86.7  PLT 347    Cardiac Enzymes: No results for input(s): CKTOTAL, CKMB, CKMBINDEX, TROPONINI in the last 168 hours.  BNP (last 3  results) No results for input(s): BNP in the last 8760 hours.  ProBNP (last 3 results) No results for input(s): PROBNP in the last 8760 hours.  Radiological Exams: No results found.  Assessment/Plan Active Problems:   Acute on chronic respiratory failure with hypoxia (HCC)   Severe sepsis with septic shock (HCC)   Chronic congestive heart failure (HCC)   AF (paroxysmal atrial fibrillation) (HCC)   1. Acute on chronic respiratory failure with hypoxia continue with trach collar weaning and titrate oxygen per protocol.  Goal today is 24 hours 2. Severe sepsis resolved hemodynamically stable 3. Chronic congestive heart failure compensated 4. Atrial fibrillation rate controlled   I have personally seen and evaluated the patient, evaluated laboratory and imaging results, formulated the assessment and plan and placed orders. The Patient requires high complexity decision making for assessment and support.  Case was discussed on Rounds with the Respiratory Therapy Staff  Yevonne Pax, MD [facility] Pulmonary Critical Care Medicine Sleep Medicine

## 2018-07-11 DIAGNOSIS — A419 Sepsis, unspecified organism: Secondary | ICD-10-CM | POA: Diagnosis not present

## 2018-07-11 DIAGNOSIS — I480 Paroxysmal atrial fibrillation: Secondary | ICD-10-CM | POA: Diagnosis not present

## 2018-07-11 DIAGNOSIS — J9621 Acute and chronic respiratory failure with hypoxia: Secondary | ICD-10-CM | POA: Diagnosis not present

## 2018-07-11 DIAGNOSIS — I509 Heart failure, unspecified: Secondary | ICD-10-CM | POA: Diagnosis not present

## 2018-07-11 NOTE — Progress Notes (Addendum)
Pulmonary Critical Care Medicine [facility] [place]   PULMONARY CRITICAL CARE SERVICE  PROGRESS NOTE  Date of Service: 07/11/2018  Kelsey Jensen  ERX:[id number]  DOB: November 24, 1950   DOA: 06/28/2018  Referring Physician: Carron Curie, MD  HPI: Kelsey Jensen is a 68 y.o. female seen for follow up of Acute on Chronic Respiratory Failure.  Patient has completed 24 hours on trach collar at 30% FiO2.  She will continue today with a goal of 48 hours.  Minimal secretions noted at this time.  Medications: Reviewed on Rounds  Physical Exam:  Vitals: Pulse 106 respirations 22 blood pressure 127/75 O2 sat 97% temp 97.8  Ventilator Settings patient not currently on ventilator  . General: Comfortable at this time . Eyes: Grossly normal lids, irises & conjunctiva . ENT: grossly tongue is normal . Neck: no obvious mass . Cardiovascular: S1 S2 normal no gallop . Respiratory: No rales or rhonchi noted . Abdomen: soft . Skin: no rash seen on limited exam . Musculoskeletal: not rigid . Psychiatric:unable to assess . Neurologic: no seizure no involuntary movements         Lab Data:   Basic Metabolic Panel: Recent Labs  Lab 07/07/18 0728 07/08/18 1856  NA 135  --   K 3.2* 3.8  CL 94*  --   CO2 30  --   GLUCOSE 129*  --   BUN 25*  --   CREATININE 0.64  --   CALCIUM 8.5*  --     ABG: No results for input(s): PHART, PCO2ART, PO2ART, HCO3, O2SAT in the last 168 hours.  Liver Function Tests: No results for input(s): AST, ALT, ALKPHOS, BILITOT, PROT, ALBUMIN in the last 168 hours. No results for input(s): LIPASE, AMYLASE in the last 168 hours. No results for input(s): AMMONIA in the last 168 hours.  CBC: No results for input(s): WBC, NEUTROABS, HGB, HCT, MCV, PLT in the last 168 hours.  Cardiac Enzymes: No results for input(s): CKTOTAL, CKMB, CKMBINDEX, TROPONINI in the last 168 hours.  BNP (last 3 results) No results for input(s): BNP in the last 8760  hours.  ProBNP (last 3 results) No results for input(s): PROBNP in the last 8760 hours.  Radiological Exams: No results found.  Assessment/Plan Active Problems:   Acute on chronic respiratory failure with hypoxia (HCC)   Severe sepsis with septic shock (HCC)   Chronic congestive heart failure (HCC)   AF (paroxysmal atrial fibrillation) (HCC)   1. Acute on chronic respiratory failure with hypoxia continue with trach collar weaning and titrate oxygen per protocol.  Goal is currently 48 hours at this time. 2. Severe sepsis resolved hemodynamically stable 3. Chronic congestive heart failure compensated 4. Atrial fibrillation rate controlled   I have personally seen and evaluated the patient, evaluated laboratory and imaging results, formulated the assessment and plan and placed orders. The Patient requires high complexity decision making for assessment and support.  Case was discussed on Rounds with the Respiratory Therapy Staff  Yevonne Pax, MD [facility] Pulmonary Critical Care Medicine Sleep Medicine

## 2018-07-12 DIAGNOSIS — A419 Sepsis, unspecified organism: Secondary | ICD-10-CM | POA: Diagnosis not present

## 2018-07-12 DIAGNOSIS — J9621 Acute and chronic respiratory failure with hypoxia: Secondary | ICD-10-CM | POA: Diagnosis not present

## 2018-07-12 DIAGNOSIS — I480 Paroxysmal atrial fibrillation: Secondary | ICD-10-CM | POA: Diagnosis not present

## 2018-07-12 DIAGNOSIS — I509 Heart failure, unspecified: Secondary | ICD-10-CM | POA: Diagnosis not present

## 2018-07-12 NOTE — Progress Notes (Signed)
Pulmonary Critical Care Medicine [facility] [place]   PULMONARY CRITICAL CARE SERVICE  PROGRESS NOTE  Date of Service: 07/12/2018  Kelsey Jensen  [id number]  DOB: 10/09/1950   DOA: 06/28/2018  Referring Physician: Carron Curie, MD  HPI: Kelsey Jensen is a 68 y.o. female seen for follow up of Acute on Chronic Respiratory Failure.  Patient is on T collar has been off the ventilator for more than 48 hours.  Also is tolerating the PMV fairly well  Medications: Reviewed on Rounds  Physical Exam:  Vitals: Temperature 97.6 pulse 91 respiratory 18 blood pressure 117/78 saturations 97%  Ventilator Settings currently is on T collar FiO2 28% with PMV in place  . General: Comfortable at this time . Eyes: Grossly normal lids, irises & conjunctiva . ENT: grossly tongue is normal . Neck: no obvious mass . Cardiovascular: S1 S2 normal no gallop . Respiratory: No rhonchi or rales are noted . Abdomen: soft . Skin: no rash seen on limited exam . Musculoskeletal: not rigid . Psychiatric:unable to assess . Neurologic: no seizure no involuntary movements         Lab Data:   Basic Metabolic Panel: Recent Labs  Lab 07/07/18 0728 07/08/18 1856  NA 135  --   K 3.2* 3.8  CL 94*  --   CO2 30  --   GLUCOSE 129*  --   BUN 25*  --   CREATININE 0.64  --   CALCIUM 8.5*  --     ABG: No results for input(s): PHART, PCO2ART, PO2ART, HCO3, O2SAT in the last 168 hours.  Liver Function Tests: No results for input(s): AST, ALT, ALKPHOS, BILITOT, PROT, ALBUMIN in the last 168 hours. No results for input(s): LIPASE, AMYLASE in the last 168 hours. No results for input(s): AMMONIA in the last 168 hours.  CBC: No results for input(s): WBC, NEUTROABS, HGB, HCT, MCV, PLT in the last 168 hours.  Cardiac Enzymes: No results for input(s): CKTOTAL, CKMB, CKMBINDEX, TROPONINI in the last 168 hours.  BNP (last 3 results) No results for input(s): BNP in the last 8760 hours.  ProBNP  (last 3 results) No results for input(s): PROBNP in the last 8760 hours.  Radiological Exams: No results found.  Assessment/Plan Active Problems:   Acute on chronic respiratory failure with hypoxia (HCC)   Severe sepsis with septic shock (HCC)   Chronic congestive heart failure (HCC)   AF (paroxysmal atrial fibrillation) (HCC)   1. Acute on chronic respiratory failure hypoxia we will continue with the T collar 2. Once patient had 72 hours we should be able to change the trach over to a cuffless 3. Severe sepsis with shock hemodynamically stable 4. Chronic congestive heart failure compensated 5. Atrial fibrillation rate controlled   I have personally seen and evaluated the patient, evaluated laboratory and imaging results, formulated the assessment and plan and placed orders. The Patient requires high complexity decision making for assessment and support.  Case was discussed on Rounds with the Respiratory Therapy Staff  Yevonne Pax, MD [facility] Pulmonary Critical Care Medicine Sleep Medicine

## 2018-07-13 ENCOUNTER — Other Ambulatory Visit (HOSPITAL_COMMUNITY): Payer: Medicare Other

## 2018-07-13 DIAGNOSIS — A419 Sepsis, unspecified organism: Secondary | ICD-10-CM | POA: Diagnosis not present

## 2018-07-13 DIAGNOSIS — I480 Paroxysmal atrial fibrillation: Secondary | ICD-10-CM | POA: Diagnosis not present

## 2018-07-13 DIAGNOSIS — J9621 Acute and chronic respiratory failure with hypoxia: Secondary | ICD-10-CM | POA: Diagnosis not present

## 2018-07-13 DIAGNOSIS — I509 Heart failure, unspecified: Secondary | ICD-10-CM | POA: Diagnosis not present

## 2018-07-13 NOTE — Progress Notes (Addendum)
Pulmonary Critical Care Medicine [facility] [place]   PULMONARY CRITICAL CARE SERVICE  PROGRESS NOTE  Date of Service: 07/13/2018  Kelsey Jensen  [id number]  DOB: 07/18/1950   DOA: 06/28/2018  Referring Physician: Carron Curie, MD  HPI: Kelsey Jensen is a 68 y.o. female seen for follow up of Acute on Chronic Respiratory Failure.  Patient has been 72 hours without the ventilator.  He was downsized to a #4 cuffless trach today.  Using PMV with no issues.  Also passed a barium swallow study and a diet has been ordered.  Medications: Reviewed on Rounds  Physical Exam:  Vitals: Pulse 90 respirations 18 blood pressure 106/90 O2 sat 96% temp 97.1  Ventilator Settings patient not currently on ventilator  . General: Comfortable at this time . Eyes: Grossly normal lids, irises & conjunctiva . ENT: grossly tongue is normal . Neck: no obvious mass . Cardiovascular: S1 S2 normal no gallop . Respiratory: No wheezes or rhonchi noted . Abdomen: soft . Skin: no rash seen on limited exam . Musculoskeletal: not rigid . Psychiatric:unable to assess . Neurologic: no seizure no involuntary movements         Lab Data:   Basic Metabolic Panel: Recent Labs  Lab 07/07/18 0728 07/08/18 1856  NA 135  --   K 3.2* 3.8  CL 94*  --   CO2 30  --   GLUCOSE 129*  --   BUN 25*  --   CREATININE 0.64  --   CALCIUM 8.5*  --     ABG: No results for input(s): PHART, PCO2ART, PO2ART, HCO3, O2SAT in the last 168 hours.  Liver Function Tests: No results for input(s): AST, ALT, ALKPHOS, BILITOT, PROT, ALBUMIN in the last 168 hours. No results for input(s): LIPASE, AMYLASE in the last 168 hours. No results for input(s): AMMONIA in the last 168 hours.  CBC: No results for input(s): WBC, NEUTROABS, HGB, HCT, MCV, PLT in the last 168 hours.  Cardiac Enzymes: No results for input(s): CKTOTAL, CKMB, CKMBINDEX, TROPONINI in the last 168 hours.  BNP (last 3 results) No results for  input(s): BNP in the last 8760 hours.  ProBNP (last 3 results) No results for input(s): PROBNP in the last 8760 hours.  Radiological Exams: No results found.  Assessment/Plan Active Problems:   Acute on chronic respiratory failure with hypoxia (HCC)   Severe sepsis with septic shock (HCC)   Chronic congestive heart failure (HCC)   AF (paroxysmal atrial fibrillation) (HCC)   1. Acute on chronic respiratory failure with hypoxia continue with capping.  Trach downsized today to #4 cuffless. 2. Severe sepsis resolved hemodynamically stable 3. Chronic congestive heart failure compensated 4. Atrial fibrillation rate controlled   I have personally seen and evaluated the patient, evaluated laboratory and imaging results, formulated the assessment and plan and placed orders. The Patient requires high complexity decision making for assessment and support.  Case was discussed on Rounds with the Respiratory Therapy Staff  Yevonne Pax, MD [facility] Pulmonary Critical Care Medicine Sleep Medicine

## 2018-07-14 DIAGNOSIS — I480 Paroxysmal atrial fibrillation: Secondary | ICD-10-CM | POA: Diagnosis not present

## 2018-07-14 DIAGNOSIS — A419 Sepsis, unspecified organism: Secondary | ICD-10-CM | POA: Diagnosis not present

## 2018-07-14 DIAGNOSIS — J9621 Acute and chronic respiratory failure with hypoxia: Secondary | ICD-10-CM | POA: Diagnosis not present

## 2018-07-14 DIAGNOSIS — I509 Heart failure, unspecified: Secondary | ICD-10-CM | POA: Diagnosis not present

## 2018-07-14 LAB — CBC
HEMATOCRIT: 39.4 % (ref 36.0–46.0)
Hemoglobin: 11.8 g/dL — ABNORMAL LOW (ref 12.0–15.0)
MCH: 26.8 pg (ref 26.0–34.0)
MCHC: 29.9 g/dL — ABNORMAL LOW (ref 30.0–36.0)
MCV: 89.5 fL (ref 80.0–100.0)
Platelets: 391 10*3/uL (ref 150–400)
RBC: 4.4 MIL/uL (ref 3.87–5.11)
RDW: 20.8 % — AB (ref 11.5–15.5)
WBC: 9.6 10*3/uL (ref 4.0–10.5)
nRBC: 0 % (ref 0.0–0.2)

## 2018-07-14 LAB — BASIC METABOLIC PANEL
Anion gap: 14 (ref 5–15)
BUN: 26 mg/dL — ABNORMAL HIGH (ref 8–23)
CO2: 27 mmol/L (ref 22–32)
Calcium: 9.1 mg/dL (ref 8.9–10.3)
Chloride: 98 mmol/L (ref 98–111)
Creatinine, Ser: 0.6 mg/dL (ref 0.44–1.00)
GFR calc Af Amer: >60 mL/min (ref >60)
GFR calc non Af Amer: >60 mL/min (ref >60)
Glucose, Bld: 117 mg/dL — ABNORMAL HIGH (ref 70–99)
Potassium: 3.4 mmol/L — ABNORMAL LOW (ref 3.5–5.1)
Sodium: 139 mmol/L (ref 135–145)

## 2018-07-14 NOTE — Progress Notes (Addendum)
Pulmonary Critical Care Medicine [facility] [place]   PULMONARY CRITICAL CARE SERVICE  PROGRESS NOTE  Date of Service: 07/14/2018  Kelsey Jensen  [id number]  DOB: 28-Oct-1950   DOA: 06/28/2018  Referring Physician: Carron Curie, MD  HPI: Kelsey Jensen is a 68 y.o. female seen for follow up of Acute on Chronic Respiratory Failure.  Patient continues to do well on trach collar with #4 cuffless trach in place.  His FiO2 has been decreased from 35 to 28% today.  Medications: Reviewed on Rounds  Physical Exam:  Vitals: Pulse 96 respirations 19 blood pressure 122/73 O2 sat 96% temp 97.1  Ventilator Settings patient not currently on ventilator  . General: Comfortable at this time . Eyes: Grossly normal lids, irises & conjunctiva . ENT: grossly tongue is normal . Neck: no obvious mass . Cardiovascular: S1 S2 normal no gallop . Respiratory: No wheezes or rhonchi noted . Abdomen: soft . Skin: no rash seen on limited exam . Musculoskeletal: not rigid . Psychiatric:unable to assess . Neurologic: no seizure no involuntary movements         Lab Data:   Basic Metabolic Panel: Recent Labs  Lab 07/08/18 1856 07/14/18 0805  NA  --  139  K 3.8 3.4*  CL  --  98  CO2  --  27  GLUCOSE  --  117*  BUN  --  26*  CREATININE  --  0.60  CALCIUM  --  9.1    ABG: No results for input(s): PHART, PCO2ART, PO2ART, HCO3, O2SAT in the last 168 hours.  Liver Function Tests: No results for input(s): AST, ALT, ALKPHOS, BILITOT, PROT, ALBUMIN in the last 168 hours. No results for input(s): LIPASE, AMYLASE in the last 168 hours. No results for input(s): AMMONIA in the last 168 hours.  CBC: Recent Labs  Lab 07/14/18 0805  WBC 9.6  HGB 11.8*  HCT 39.4  MCV 89.5  PLT 391    Cardiac Enzymes: No results for input(s): CKTOTAL, CKMB, CKMBINDEX, TROPONINI in the last 168 hours.  BNP (last 3 results) No results for input(s): BNP in the last 8760 hours.  ProBNP (last 3  results) No results for input(s): PROBNP in the last 8760 hours.  Radiological Exams: No results found.  Assessment/Plan Active Problems:   Acute on chronic respiratory failure with hypoxia (HCC)   Severe sepsis with septic shock (HCC)   Chronic congestive heart failure (HCC)   AF (paroxysmal atrial fibrillation) (HCC)   1. Acute on chronic respiratory failure with hypoxia continue with capping as tolerated. 2. Severe sepsis resolved hemodynamically stable 3. Chronic congestive heart failure compensated 4. Atrial fibrillation rate controlled   I have personally seen and evaluated the patient, evaluated laboratory and imaging results, formulated the assessment and plan and placed orders. The Patient requires high complexity decision making for assessment and support.  Case was discussed on Rounds with the Respiratory Therapy Staff  Yevonne Pax, MD [facility] Pulmonary Critical Care Medicine Sleep Medicine

## 2018-07-15 DIAGNOSIS — A419 Sepsis, unspecified organism: Secondary | ICD-10-CM | POA: Diagnosis not present

## 2018-07-15 DIAGNOSIS — I480 Paroxysmal atrial fibrillation: Secondary | ICD-10-CM | POA: Diagnosis not present

## 2018-07-15 DIAGNOSIS — J9621 Acute and chronic respiratory failure with hypoxia: Secondary | ICD-10-CM | POA: Diagnosis not present

## 2018-07-15 DIAGNOSIS — I509 Heart failure, unspecified: Secondary | ICD-10-CM | POA: Diagnosis not present

## 2018-07-15 NOTE — Progress Notes (Signed)
Pulmonary Critical Care Medicine [facility] [place]   PULMONARY CRITICAL CARE SERVICE  PROGRESS NOTE  Date of Service: 07/15/2018  Kelsey Jensen  [id number]  DOB: 08-Jul-1950   DOA: 06/28/2018  Referring Physician: Carron Curie, MD  HPI: Kelsey Jensen is a 68 y.o. female seen for follow up of Acute on Chronic Respiratory Failure.  At this time patient is on T collar currently on 28% oxygen good saturations are noted patient is also been tolerating the PMV  Medications: Reviewed on Rounds  Physical Exam:  Vitals: Temperature 97.2 pulse 102 respiratory rate 20 blood pressure is 123/87 saturations 96%  Ventilator Settings T collar off the ventilator at this time  . General: Comfortable at this time . Eyes: Grossly normal lids, irises & conjunctiva . ENT: grossly tongue is normal . Neck: no obvious mass . Cardiovascular: S1 S2 normal no gallop . Respiratory: Scattered rhonchi are noted bilaterally . Abdomen: soft . Skin: no rash seen on limited exam . Musculoskeletal: not rigid . Psychiatric:unable to assess . Neurologic: no seizure no involuntary movements         Lab Data:   Basic Metabolic Panel: Recent Labs  Lab 07/14/18 0805  NA 139  K 3.4*  CL 98  CO2 27  GLUCOSE 117*  BUN 26*  CREATININE 0.60  CALCIUM 9.1    ABG: No results for input(s): PHART, PCO2ART, PO2ART, HCO3, O2SAT in the last 168 hours.  Liver Function Tests: No results for input(s): AST, ALT, ALKPHOS, BILITOT, PROT, ALBUMIN in the last 168 hours. No results for input(s): LIPASE, AMYLASE in the last 168 hours. No results for input(s): AMMONIA in the last 168 hours.  CBC: Recent Labs  Lab 07/14/18 0805  WBC 9.6  HGB 11.8*  HCT 39.4  MCV 89.5  PLT 391    Cardiac Enzymes: No results for input(s): CKTOTAL, CKMB, CKMBINDEX, TROPONINI in the last 168 hours.  BNP (last 3 results) No results for input(s): BNP in the last 8760 hours.  ProBNP (last 3 results) No results  for input(s): PROBNP in the last 8760 hours.  Radiological Exams: No results found.  Assessment/Plan Active Problems:   Acute on chronic respiratory failure with hypoxia (HCC)   Severe sepsis with septic shock (HCC)   Chronic congestive heart failure (HCC)   AF (paroxysmal atrial fibrillation) (HCC)   1. Acute on chronic respiratory failure with hypoxia we will continue with T collar trials titrate oxygen as tolerated continue pulmonary toilet 2. Severe sepsis with shock hemodynamically stable 3. Chronic congestive heart failure at baseline continue with present management 4. Atrial fibrillation rate is controlled at this time   I have personally seen and evaluated the patient, evaluated laboratory and imaging results, formulated the assessment and plan and placed orders. The Patient requires high complexity decision making for assessment and support.  Case was discussed on Rounds with the Respiratory Therapy Staff  Yevonne Pax, MD [facility] Pulmonary Critical Care Medicine Sleep Medicine

## 2018-07-16 DIAGNOSIS — J9621 Acute and chronic respiratory failure with hypoxia: Secondary | ICD-10-CM | POA: Diagnosis not present

## 2018-07-16 DIAGNOSIS — I509 Heart failure, unspecified: Secondary | ICD-10-CM | POA: Diagnosis not present

## 2018-07-16 DIAGNOSIS — A419 Sepsis, unspecified organism: Secondary | ICD-10-CM | POA: Diagnosis not present

## 2018-07-16 DIAGNOSIS — I480 Paroxysmal atrial fibrillation: Secondary | ICD-10-CM | POA: Diagnosis not present

## 2018-07-16 NOTE — Progress Notes (Signed)
Pulmonary Critical Care Medicine [facility] [place]   PULMONARY CRITICAL CARE SERVICE  PROGRESS NOTE  Date of Service: 07/16/2018  Kelsey Jensen  FXT:[id number]  DOB: Feb 20, 1951   DOA: 06/28/2018  Referring Physician: Carron Curie, MD  HPI: Kelsey Jensen is a 68 y.o. female seen for follow up of Acute on Chronic Respiratory Failure.  Patient is on T collar right now 28% FiO2 with the PMV in place tolerating it well  Medications: Reviewed on Rounds  Physical Exam:  Vitals: Temperature 96.0 pulse 94 respiratory 23 blood pressure 116/75 saturations 97%  Ventilator Settings off the ventilator on T collar at this time FiO2 28%  . General: Comfortable at this time . Eyes: Grossly normal lids, irises & conjunctiva . ENT: grossly tongue is normal . Neck: no obvious mass . Cardiovascular: S1 S2 normal no gallop . Respiratory: Coarse breath sounds with few rhonchi are noted at this time . Abdomen: soft . Skin: no rash seen on limited exam . Musculoskeletal: not rigid . Psychiatric:unable to assess . Neurologic: no seizure no involuntary movements         Lab Data:   Basic Metabolic Panel: Recent Labs  Lab 07/14/18 0805  NA 139  K 3.4*  CL 98  CO2 27  GLUCOSE 117*  BUN 26*  CREATININE 0.60  CALCIUM 9.1    ABG: No results for input(s): PHART, PCO2ART, PO2ART, HCO3, O2SAT in the last 168 hours.  Liver Function Tests: No results for input(s): AST, ALT, ALKPHOS, BILITOT, PROT, ALBUMIN in the last 168 hours. No results for input(s): LIPASE, AMYLASE in the last 168 hours. No results for input(s): AMMONIA in the last 168 hours.  CBC: Recent Labs  Lab 07/14/18 0805  WBC 9.6  HGB 11.8*  HCT 39.4  MCV 89.5  PLT 391    Cardiac Enzymes: No results for input(s): CKTOTAL, CKMB, CKMBINDEX, TROPONINI in the last 168 hours.  BNP (last 3 results) No results for input(s): BNP in the last 8760 hours.  ProBNP (last 3 results) No results for input(s): PROBNP  in the last 8760 hours.  Radiological Exams: No results found.  Assessment/Plan Active Problems:   Acute on chronic respiratory failure with hypoxia (HCC)   Severe sepsis with septic shock (HCC)   Chronic congestive heart failure (HCC)   AF (paroxysmal atrial fibrillation) (HCC)   1. Acute on chronic respiratory failure with hypoxia we will continue with T collar and the PMV as tolerated continue secretion management pulmonary toilet 2. Severe sepsis with shock hemodynamically stable continue present management 3. Chronic congestive heart failure at baseline 4. Atrial fibrillation rate is controlled at this time   I have personally seen and evaluated the patient, evaluated laboratory and imaging results, formulated the assessment and plan and placed orders. The Patient requires high complexity decision making for assessment and support.  Case was discussed on Rounds with the Respiratory Therapy Staff  Yevonne Pax, MD [facility] Pulmonary Critical Care Medicine Sleep Medicine

## 2018-07-17 DIAGNOSIS — I509 Heart failure, unspecified: Secondary | ICD-10-CM | POA: Diagnosis not present

## 2018-07-17 DIAGNOSIS — J9621 Acute and chronic respiratory failure with hypoxia: Secondary | ICD-10-CM | POA: Diagnosis not present

## 2018-07-17 DIAGNOSIS — A419 Sepsis, unspecified organism: Secondary | ICD-10-CM | POA: Diagnosis not present

## 2018-07-17 DIAGNOSIS — I480 Paroxysmal atrial fibrillation: Secondary | ICD-10-CM | POA: Diagnosis not present

## 2018-07-17 NOTE — Progress Notes (Addendum)
Pulmonary Critical Care Medicine [facility] [place]   PULMONARY CRITICAL CARE SERVICE  PROGRESS NOTE  Date of Service: 07/17/2018  Kelsey Jensen  AQT:[id number]  DOB: 1950-09-12   DOA: 06/28/2018  Referring Physician: Carron Curie, MD  HPI: Kelsey Jensen is a 68 y.o. female seen for follow up of Acute on Chronic Respiratory Failure.  Patient continues to do well on trach collar FiO2 28%.  Using PMV without difficulty.  Minimal secretions since noted.  Medications: Reviewed on Rounds  Physical Exam:  Vitals: Pulse 90 respirations 20 BP 130/75 O2 sat 96% temp 97.6  Ventilator Settings patient's not currently on ventilator  . General: Comfortable at this time . Eyes: Grossly normal lids, irises & conjunctiva . ENT: grossly tongue is normal . Neck: no obvious mass . Cardiovascular: S1 S2 normal no gallop . Respiratory: Coarse breath sounds noted . Abdomen: soft . Skin: no rash seen on limited exam . Musculoskeletal: not rigid . Psychiatric:unable to assess . Neurologic: no seizure no involuntary movements         Lab Data:   Basic Metabolic Panel: Recent Labs  Lab 07/14/18 0805  NA 139  K 3.4*  CL 98  CO2 27  GLUCOSE 117*  BUN 26*  CREATININE 0.60  CALCIUM 9.1    ABG: No results for input(s): PHART, PCO2ART, PO2ART, HCO3, O2SAT in the last 168 hours.  Liver Function Tests: No results for input(s): AST, ALT, ALKPHOS, BILITOT, PROT, ALBUMIN in the last 168 hours. No results for input(s): LIPASE, AMYLASE in the last 168 hours. No results for input(s): AMMONIA in the last 168 hours.  CBC: Recent Labs  Lab 07/14/18 0805  WBC 9.6  HGB 11.8*  HCT 39.4  MCV 89.5  PLT 391    Cardiac Enzymes: No results for input(s): CKTOTAL, CKMB, CKMBINDEX, TROPONINI in the last 168 hours.  BNP (last 3 results) No results for input(s): BNP in the last 8760 hours.  ProBNP (last 3 results) No results for input(s): PROBNP in the last 8760  hours.  Radiological Exams: No results found.  Assessment/Plan Active Problems:   Acute on chronic respiratory failure with hypoxia (HCC)   Severe sepsis with septic shock (HCC)   Chronic congestive heart failure (HCC)   AF (paroxysmal atrial fibrillation) (HCC)   1. Acute on chronic respiratory failure with hypoxia continue with trach collar and PMV as tolerated.  Continue aggressive pulmonary toilet and secretion management. 2. Severe sepsis with shock hemodynamically stable continue present management 3. Chronic congestive heart failure at baseline 4. Atrial fibrillation rate is controlled at this time   I have personally seen and evaluated the patient, evaluated laboratory and imaging results, formulated the assessment and plan and placed orders. The Patient requires high complexity decision making for assessment and support.  Case was discussed on Rounds with the Respiratory Therapy Staff  Yevonne Pax, MD [facility] Pulmonary Critical Care Medicine Sleep Medicine

## 2018-07-18 DIAGNOSIS — I509 Heart failure, unspecified: Secondary | ICD-10-CM | POA: Diagnosis not present

## 2018-07-18 DIAGNOSIS — A419 Sepsis, unspecified organism: Secondary | ICD-10-CM | POA: Diagnosis not present

## 2018-07-18 DIAGNOSIS — I480 Paroxysmal atrial fibrillation: Secondary | ICD-10-CM | POA: Diagnosis not present

## 2018-07-18 DIAGNOSIS — J9621 Acute and chronic respiratory failure with hypoxia: Secondary | ICD-10-CM | POA: Diagnosis not present

## 2018-07-18 NOTE — Progress Notes (Addendum)
Pulmonary Critical Care Medicine [facility] [place]   PULMONARY CRITICAL CARE SERVICE  PROGRESS NOTE  Date of Service: 07/18/2018  Kelsey Jensen  ZOX:[id number]  DOB: Nov 26, 1950   DOA: 06/28/2018  Referring Physician: Carron Curie, MD  HPI: Kelsey Jensen is a 68 y.o. female seen for follow up of Acute on Chronic Respiratory Failure.  Patient is doing well on trach collar today FiO2 20%.  Using PMV without difficulty and has minimal secretions at this time.  Medications: Reviewed on Rounds  Physical Exam:  Vitals: Pulse 100 respirations 22 BP 158/62 O2 sat 92% temperature 97.8  Ventilator Settings patient's not currently on ventilator  . General: Comfortable at this time . Eyes: Grossly normal lids, irises & conjunctiva . ENT: grossly tongue is normal . Neck: no obvious mass . Cardiovascular: S1 S2 normal no gallop . Respiratory: Coarse breath sounds . Abdomen: soft . Skin: no rash seen on limited exam . Musculoskeletal: not rigid . Psychiatric:unable to assess . Neurologic: no seizure no involuntary movements         Lab Data:   Basic Metabolic Panel: Recent Labs  Lab 07/14/18 0805  NA 139  K 3.4*  CL 98  CO2 27  GLUCOSE 117*  BUN 26*  CREATININE 0.60  CALCIUM 9.1    ABG: No results for input(s): PHART, PCO2ART, PO2ART, HCO3, O2SAT in the last 168 hours.  Liver Function Tests: No results for input(s): AST, ALT, ALKPHOS, BILITOT, PROT, ALBUMIN in the last 168 hours. No results for input(s): LIPASE, AMYLASE in the last 168 hours. No results for input(s): AMMONIA in the last 168 hours.  CBC: Recent Labs  Lab 07/14/18 0805  WBC 9.6  HGB 11.8*  HCT 39.4  MCV 89.5  PLT 391    Cardiac Enzymes: No results for input(s): CKTOTAL, CKMB, CKMBINDEX, TROPONINI in the last 168 hours.  BNP (last 3 results) No results for input(s): BNP in the last 8760 hours.  ProBNP (last 3 results) No results for input(s): PROBNP in the last 8760  hours.  Radiological Exams: No results found.  Assessment/Plan Active Problems:   Acute on chronic respiratory failure with hypoxia (HCC)   Severe sepsis with septic shock (HCC)   Chronic congestive heart failure (HCC)   AF (paroxysmal atrial fibrillation) (HCC)   1. Acute on chronic respiratory failure with hypoxia continue trach collar PMV as tolerated.  Continue aggressive pulmonary toilet and secretion management 2. Severe sepsis with shock hemodynamically stable continue present management 3. Chronic congestive heart failure at baseline 4. Atrial fibrillation rate is controlled   I have personally seen and evaluated the patient, evaluated laboratory and imaging results, formulated the assessment and plan and placed orders. The Patient requires high complexity decision making for assessment and support.  Case was discussed on Rounds with the Respiratory Therapy Staff  Yevonne Pax, MD [facility] Pulmonary Critical Care Medicine Sleep Medicine

## 2018-07-19 DIAGNOSIS — A419 Sepsis, unspecified organism: Secondary | ICD-10-CM | POA: Diagnosis not present

## 2018-07-19 DIAGNOSIS — I509 Heart failure, unspecified: Secondary | ICD-10-CM | POA: Diagnosis not present

## 2018-07-19 DIAGNOSIS — I480 Paroxysmal atrial fibrillation: Secondary | ICD-10-CM | POA: Diagnosis not present

## 2018-07-19 DIAGNOSIS — J9621 Acute and chronic respiratory failure with hypoxia: Secondary | ICD-10-CM | POA: Diagnosis not present

## 2018-07-19 LAB — FUNGUS CULTURE RESULT

## 2018-07-19 LAB — FUNGUS CULTURE WITH STAIN

## 2018-07-19 LAB — FUNGAL ORGANISM REFLEX

## 2018-07-19 NOTE — Progress Notes (Signed)
Pulmonary Critical Care Medicine [facility] [place]   PULMONARY CRITICAL CARE SERVICE  PROGRESS NOTE  Date of Service: 07/19/2018  Kelsey Jensen  DPO:[id number]  DOB: 05/14/1951   DOA: 06/28/2018  Referring Physician: Carron Curie, MD  HPI: Kelsey Jensen is a 68 y.o. female seen for follow up of Acute on Chronic Respiratory Failure.  At this time patient is on T collar has been on 28% FiO2 doing fairly well capping without distress at this time  Medications: Reviewed on Rounds  Physical Exam:  Vitals: Temperature 97.8 pulse 92 respiratory 20 blood pressure 100/65 saturations 95%  Ventilator Settings currently on T collar FiO2 28% with the PMV in place  . General: Comfortable at this time . Eyes: Grossly normal lids, irises & conjunctiva . ENT: grossly tongue is normal . Neck: no obvious mass . Cardiovascular: S1 S2 normal no gallop . Respiratory: Scattered rhonchi expansion is equal . Abdomen: soft . Skin: no rash seen on limited exam . Musculoskeletal: not rigid . Psychiatric:unable to assess . Neurologic: no seizure no involuntary movements         Lab Data:   Basic Metabolic Panel: Recent Labs  Lab 07/14/18 0805  NA 139  K 3.4*  CL 98  CO2 27  GLUCOSE 117*  BUN 26*  CREATININE 0.60  CALCIUM 9.1    ABG: No results for input(s): PHART, PCO2ART, PO2ART, HCO3, O2SAT in the last 168 hours.  Liver Function Tests: No results for input(s): AST, ALT, ALKPHOS, BILITOT, PROT, ALBUMIN in the last 168 hours. No results for input(s): LIPASE, AMYLASE in the last 168 hours. No results for input(s): AMMONIA in the last 168 hours.  CBC: Recent Labs  Lab 07/14/18 0805  WBC 9.6  HGB 11.8*  HCT 39.4  MCV 89.5  PLT 391    Cardiac Enzymes: No results for input(s): CKTOTAL, CKMB, CKMBINDEX, TROPONINI in the last 168 hours.  BNP (last 3 results) No results for input(s): BNP in the last 8760 hours.  ProBNP (last 3 results) No results for input(s):  PROBNP in the last 8760 hours.  Radiological Exams: No results found.  Assessment/Plan Active Problems:   Acute on chronic respiratory failure with hypoxia (HCC)   Severe sepsis with septic shock (HCC)   Chronic congestive heart failure (HCC)   AF (paroxysmal atrial fibrillation) (HCC)   1. Acute on chronic respiratory failure with hypoxia continue to wean T collar PMV as ordered. 2. Severe sepsis with shock resolved 3. Chronic congestive heart failure continue present management monitor fluid status 4. Atrial fibrillation rate controlled   I have personally seen and evaluated the patient, evaluated laboratory and imaging results, formulated the assessment and plan and placed orders. The Patient requires high complexity decision making for assessment and support.  Case was discussed on Rounds with the Respiratory Therapy Staff  Yevonne Pax, MD [facility] Pulmonary Critical Care Medicine Sleep Medicine

## 2018-07-20 DIAGNOSIS — A419 Sepsis, unspecified organism: Secondary | ICD-10-CM | POA: Diagnosis not present

## 2018-07-20 DIAGNOSIS — J9621 Acute and chronic respiratory failure with hypoxia: Secondary | ICD-10-CM | POA: Diagnosis not present

## 2018-07-20 DIAGNOSIS — I509 Heart failure, unspecified: Secondary | ICD-10-CM | POA: Diagnosis not present

## 2018-07-20 DIAGNOSIS — I480 Paroxysmal atrial fibrillation: Secondary | ICD-10-CM | POA: Diagnosis not present

## 2018-07-20 NOTE — Progress Notes (Signed)
Pulmonary Critical Care Medicine [facility] [place]   PULMONARY CRITICAL CARE SERVICE  PROGRESS NOTE  Date of Service: 07/20/2018  Kelsey Jensen  LYH:[id number]  DOB: 09-08-1950   DOA: 06/28/2018  Referring Physician: Carron Curie, MD  HPI: Kelsey Jensen is a 68 y.o. female seen for follow up of Acute on Chronic Respiratory Failure.  Patient is capping doing fairly well right now has a good strong cough noted.  Medications: Reviewed on Rounds  Physical Exam:  Vitals: Temperature 97.3 pulse 88 respiratory rate 18 blood pressure 113/68 saturation 95%  Ventilator Settings capping right now off the ventilator without distress  . General: Comfortable at this time . Eyes: Grossly normal lids, irises & conjunctiva . ENT: grossly tongue is normal . Neck: no obvious mass . Cardiovascular: S1 S2 normal no gallop . Respiratory: No rhonchi or rales are noted at this time . Abdomen: soft . Skin: no rash seen on limited exam . Musculoskeletal: not rigid . Psychiatric:unable to assess . Neurologic: no seizure no involuntary movements         Lab Data:   Basic Metabolic Panel: Recent Labs  Lab 07/14/18 0805  NA 139  K 3.4*  CL 98  CO2 27  GLUCOSE 117*  BUN 26*  CREATININE 0.60  CALCIUM 9.1    ABG: No results for input(s): PHART, PCO2ART, PO2ART, HCO3, O2SAT in the last 168 hours.  Liver Function Tests: No results for input(s): AST, ALT, ALKPHOS, BILITOT, PROT, ALBUMIN in the last 168 hours. No results for input(s): LIPASE, AMYLASE in the last 168 hours. No results for input(s): AMMONIA in the last 168 hours.  CBC: Recent Labs  Lab 07/14/18 0805  WBC 9.6  HGB 11.8*  HCT 39.4  MCV 89.5  PLT 391    Cardiac Enzymes: No results for input(s): CKTOTAL, CKMB, CKMBINDEX, TROPONINI in the last 168 hours.  BNP (last 3 results) No results for input(s): BNP in the last 8760 hours.  ProBNP (last 3 results) No results for input(s): PROBNP in the last 8760  hours.  Radiological Exams: No results found.  Assessment/Plan Active Problems:   Acute on chronic respiratory failure with hypoxia (HCC)   Severe sepsis with septic shock (HCC)   Chronic congestive heart failure (HCC)   AF (paroxysmal atrial fibrillation) (HCC)   1. Acute on chronic respiratory failure with hypoxia we will continue with capping trials patient's goal is 24 hours.  Right now is strong cough so hopefully we should be able to decannulate 2. Severe sepsis with shock hemodynamically stable we will continue present management 3. Chronic congestive heart failure at baseline continue with supportive care 4. Paroxysmal atrial fibrillation rate controlled we will continue supportive care   I have personally seen and evaluated the patient, evaluated laboratory and imaging results, formulated the assessment and plan and placed orders. The Patient requires high complexity decision making for assessment and support.  Case was discussed on Rounds with the Respiratory Therapy Staff  Yevonne Pax, MD [facility] Pulmonary Critical Care Medicine Sleep Medicine

## 2018-07-21 DIAGNOSIS — I509 Heart failure, unspecified: Secondary | ICD-10-CM | POA: Diagnosis not present

## 2018-07-21 DIAGNOSIS — I480 Paroxysmal atrial fibrillation: Secondary | ICD-10-CM | POA: Diagnosis not present

## 2018-07-21 DIAGNOSIS — J9621 Acute and chronic respiratory failure with hypoxia: Secondary | ICD-10-CM | POA: Diagnosis not present

## 2018-07-21 DIAGNOSIS — A419 Sepsis, unspecified organism: Secondary | ICD-10-CM | POA: Diagnosis not present

## 2018-07-21 NOTE — Progress Notes (Signed)
[facility] [place]   PULMONARY CRITICAL CARE SERVICE  PROGRESS NOTE  Date of Service: 07/21/2018  Kelsey Jensen  ZOX:[id number]  DOB: Nov 10, 1950   DOA: 06/28/2018  Referring Physician: Carron CurieAli Hijazi, MD  HPI: Kelsey Jensen is a 68 y.o. female seen for follow up of Acute on Chronic Respiratory Failure.  Patient is capping right now is on 48-hour goal today  Medications: Reviewed on Rounds  Physical Exam:  Vitals: Temperature 97.8 pulse 79 respiratory 21 blood pressure 118/84 saturations 97%  Ventilator Settings capping off the ventilator  . General: Comfortable at this time . Eyes: Grossly normal lids, irises & conjunctiva . ENT: grossly tongue is normal . Neck: no obvious mass . Cardiovascular: S1 S2 normal no gallop . Respiratory: No rhonchi or rales are noted at this time . Abdomen: soft . Skin: no rash seen on limited exam . Musculoskeletal: not rigid . Psychiatric:unable to assess . Neurologic: no seizure no involuntary movements         Lab Data:   Basic Metabolic Panel: No results for input(s): NA, K, CL, CO2, GLUCOSE, BUN, CREATININE, CALCIUM, MG, PHOS in the last 168 hours.  ABG: No results for input(s): PHART, PCO2ART, PO2ART, HCO3, O2SAT in the last 168 hours.  Liver Function Tests: No results for input(s): AST, ALT, ALKPHOS, BILITOT, PROT, ALBUMIN in the last 168 hours. No results for input(s): LIPASE, AMYLASE in the last 168 hours. No results for input(s): AMMONIA in the last 168 hours.  CBC: No results for input(s): WBC, NEUTROABS, HGB, HCT, MCV, PLT in the last 168 hours.  Cardiac Enzymes: No results for input(s): CKTOTAL, CKMB, CKMBINDEX, TROPONINI in the last 168 hours.  BNP (last 3 results) No results for input(s): BNP in the last 8760 hours.  ProBNP (last 3 results) No results for input(s): PROBNP in the last 8760 hours.  Radiological Exams: No results found.  Assessment/Plan Active  Problems:   Acute on chronic respiratory failure with hypoxia (HCC)   Severe sepsis with septic shock (HCC)   Chronic congestive heart failure (HCC)   AF (paroxysmal atrial fibrillation) (HCC)   1. Acute on chronic respiratory failure with hypoxia we will continue with capping trials titrate oxygen as tolerated continue secretion management pulmonary toilet hopefully be able to decannulate tomorrow 2. Severe sepsis resolved 3. Congestive heart failure compensated 4. Atrial fibrillation rate controlled   I have personally seen and evaluated the patient, evaluated laboratory and imaging results, formulated the assessment and plan and placed orders. The Patient requires high complexity decision making for assessment and support.  Case was discussed on Rounds with the Respiratory Therapy Staff  Yevonne PaxSaadat A , MD [facility] Pulmonary Critical Care Medicine Sleep Medicine

## 2018-07-22 DIAGNOSIS — J9621 Acute and chronic respiratory failure with hypoxia: Secondary | ICD-10-CM | POA: Diagnosis not present

## 2018-07-22 DIAGNOSIS — I480 Paroxysmal atrial fibrillation: Secondary | ICD-10-CM | POA: Diagnosis not present

## 2018-07-22 DIAGNOSIS — A419 Sepsis, unspecified organism: Secondary | ICD-10-CM | POA: Diagnosis not present

## 2018-07-22 DIAGNOSIS — I509 Heart failure, unspecified: Secondary | ICD-10-CM | POA: Diagnosis not present

## 2018-07-22 LAB — BASIC METABOLIC PANEL
Anion gap: 12 (ref 5–15)
BUN: 35 mg/dL — AB (ref 8–23)
CO2: 29 mmol/L (ref 22–32)
Calcium: 8.7 mg/dL — ABNORMAL LOW (ref 8.9–10.3)
Chloride: 103 mmol/L (ref 98–111)
Creatinine, Ser: 0.82 mg/dL (ref 0.44–1.00)
GFR calc Af Amer: >60 mL/min (ref >60)
GFR calc non Af Amer: >60 mL/min (ref >60)
Glucose, Bld: 92 mg/dL (ref 70–99)
Potassium: 3.4 mmol/L — ABNORMAL LOW (ref 3.5–5.1)
Sodium: 144 mmol/L (ref 135–145)

## 2018-07-22 LAB — CBC
HCT: 35.9 % — ABNORMAL LOW (ref 36.0–46.0)
Hemoglobin: 10.6 g/dL — ABNORMAL LOW (ref 12.0–15.0)
MCH: 27.5 pg (ref 26.0–34.0)
MCHC: 29.5 g/dL — ABNORMAL LOW (ref 30.0–36.0)
MCV: 93.2 fL (ref 80.0–100.0)
Platelets: 363 10*3/uL (ref 150–400)
RBC: 3.85 MIL/uL — ABNORMAL LOW (ref 3.87–5.11)
RDW: 23.1 % — ABNORMAL HIGH (ref 11.5–15.5)
WBC: 9.2 10*3/uL (ref 4.0–10.5)
nRBC: 0 % (ref 0.0–0.2)

## 2018-07-22 NOTE — Progress Notes (Signed)
Pulmonary Critical Care Medicine [facility] [place]   PULMONARY CRITICAL CARE SERVICE  PROGRESS NOTE  Date of Service: 07/22/2018  Kelsey Jensen  [id number]:[id number]  DOB: [birth year]   DOA: 06/28/2018  Referring Physician: Carron Curie, MD  HPI: Kelsey Jensen is a 68 y.o. female seen for follow up of Acute on Chronic Respiratory Failure.  Doing well capping she should be able to decannulate today  Medications: Reviewed on Rounds  Physical Exam:  Vitals: Temperature 98.4 pulse 60 respiratory 18 blood pressure 134/92 saturations 98%  Ventilator Settings capping right now we will proceed to decannulation  . General: Comfortable at this time . Eyes: Grossly normal lids, irises & conjunctiva . ENT: grossly tongue is normal . Neck: no obvious mass . Cardiovascular: S1 S2 normal no gallop . Respiratory: No rhonchi or rales are noted at this time . Abdomen: soft . Skin: no rash seen on limited exam . Musculoskeletal: not rigid . Psychiatric:unable to assess . Neurologic: no seizure no involuntary movements         Lab Data:   Basic Metabolic Panel: Recent Labs  Lab 07/22/18 0611  NA 144  K 3.4*  CL 103  CO2 29  GLUCOSE 92  BUN 35*  CREATININE 0.82  CALCIUM 8.7*    ABG: No results for input(s): PHART, PCO2ART, PO2ART, HCO3, O2SAT in the last 168 hours.  Liver Function Tests: No results for input(s): AST, ALT, ALKPHOS, BILITOT, PROT, ALBUMIN in the last 168 hours. No results for input(s): LIPASE, AMYLASE in the last 168 hours. No results for input(s): AMMONIA in the last 168 hours.  CBC: Recent Labs  Lab 07/22/18 0611  WBC 9.2  HGB 10.6*  HCT 35.9*  MCV 93.2  PLT 363    Cardiac Enzymes: No results for input(s): CKTOTAL, CKMB, CKMBINDEX, TROPONINI in the last 168 hours.  BNP (last 3 results) No results for input(s): BNP in the last 8760 hours.  ProBNP (last 3 results) No results for input(s): PROBNP in the last 8760 hours.  Radiological  Exams: No results found.  Assessment/Plan Active Problems:   Acute on chronic respiratory failure with hypoxia (HCC)   Severe sepsis with septic shock (HCC)   Chronic congestive heart failure (HCC)   AF (paroxysmal atrial fibrillation) (HCC)   1. Acute on chronic respiratory failure with hypoxia we will proceed to decannulation 2. Severe sepsis resolved 3. Chronic congestive heart failure compensated 4. Atrial fibrillation rate controlled we will continue to monitor   I have personally seen and evaluated the patient, evaluated laboratory and imaging results, formulated the assessment and plan and placed orders. The Patient requires high complexity decision making for assessment and support.  Case was discussed on Rounds with the Respiratory Therapy Staff  Yevonne Pax, MD [facility] Pulmonary Critical Care Medicine Sleep Medicine

## 2018-07-23 DIAGNOSIS — I480 Paroxysmal atrial fibrillation: Secondary | ICD-10-CM | POA: Diagnosis not present

## 2018-07-23 DIAGNOSIS — J9621 Acute and chronic respiratory failure with hypoxia: Secondary | ICD-10-CM | POA: Diagnosis not present

## 2018-07-23 DIAGNOSIS — A419 Sepsis, unspecified organism: Secondary | ICD-10-CM | POA: Diagnosis not present

## 2018-07-23 DIAGNOSIS — I509 Heart failure, unspecified: Secondary | ICD-10-CM | POA: Diagnosis not present

## 2018-07-23 LAB — POTASSIUM: Potassium: 3.7 mmol/L (ref 3.5–5.1)

## 2018-07-23 NOTE — Progress Notes (Signed)
Pulmonary Critical Care Medicine [facility] [place]   PULMONARY CRITICAL CARE SERVICE  PROGRESS NOTE  Date of Service: 07/23/2018  Kelsey Jensen  TGY:[id number]  DOB: [birth year]   DOA: 06/28/2018  Referring Physician: Carron Curie, MD  HPI: Kelsey Jensen is a 68 y.o. female seen for follow up of Acute on Chronic Respiratory Failure.  Doing well on nasal cannula right now and after decannulation  Medications: Reviewed on Rounds  Physical Exam:  Vitals: Temperature 97.2 pulse 85 respiratory 29 blood pressure 117/78 saturations 97%  Ventilator Settings off the ventilator decannulated  . General: Comfortable at this time . Eyes: Grossly normal lids, irises & conjunctiva . ENT: grossly tongue is normal . Neck: no obvious mass . Cardiovascular: S1 S2 normal no gallop . Respiratory: No rhonchi or rales are noted . Abdomen: soft . Skin: no rash seen on limited exam . Musculoskeletal: not rigid . Psychiatric:unable to assess . Neurologic: no seizure no involuntary movements         Lab Data:   Basic Metabolic Panel: Recent Labs  Lab 07/22/18 0611 07/23/18 0538  NA 144  --   K 3.4* 3.7  CL 103  --   CO2 29  --   GLUCOSE 92  --   BUN 35*  --   CREATININE 0.82  --   CALCIUM 8.7*  --     ABG: No results for input(s): PHART, PCO2ART, PO2ART, HCO3, O2SAT in the last 168 hours.  Liver Function Tests: No results for input(s): AST, ALT, ALKPHOS, BILITOT, PROT, ALBUMIN in the last 168 hours. No results for input(s): LIPASE, AMYLASE in the last 168 hours. No results for input(s): AMMONIA in the last 168 hours.  CBC: Recent Labs  Lab 07/22/18 0611  WBC 9.2  HGB 10.6*  HCT 35.9*  MCV 93.2  PLT 363    Cardiac Enzymes: No results for input(s): CKTOTAL, CKMB, CKMBINDEX, TROPONINI in the last 168 hours.  BNP (last 3 results) No results for input(s): BNP in the last 8760 hours.  ProBNP (last 3 results) No results for input(s): PROBNP in the last 8760  hours.  Radiological Exams: No results found.  Assessment/Plan Active Problems:   Acute on chronic respiratory failure with hypoxia (HCC)   Severe sepsis with septic shock (HCC)   Chronic congestive heart failure (HCC)   AF (paroxysmal atrial fibrillation) (HCC)   1. Acute on chronic respiratory failure with hypoxia we will continue with oxygen therapy via nasal cannula 2. Severe sepsis with shock hemodynamically stable 3. CHF stable we will continue present management 4. Atrial fibrillation rate controlled at this time   I have personally seen and evaluated the patient, evaluated laboratory and imaging results, formulated the assessment and plan and placed orders. The Patient requires high complexity decision making for assessment and support.  Case was discussed on Rounds with the Respiratory Therapy Staff  Yevonne Pax, MD [facility] Pulmonary Critical Care Medicine Sleep Medicine

## 2018-07-24 DIAGNOSIS — A419 Sepsis, unspecified organism: Secondary | ICD-10-CM | POA: Diagnosis not present

## 2018-07-24 DIAGNOSIS — J9621 Acute and chronic respiratory failure with hypoxia: Secondary | ICD-10-CM | POA: Diagnosis not present

## 2018-07-24 DIAGNOSIS — I509 Heart failure, unspecified: Secondary | ICD-10-CM | POA: Diagnosis not present

## 2018-07-24 DIAGNOSIS — I480 Paroxysmal atrial fibrillation: Secondary | ICD-10-CM | POA: Diagnosis not present

## 2018-07-24 NOTE — Progress Notes (Addendum)
Pulmonary Critical Care Medicine [facility] [place]   PULMONARY CRITICAL CARE SERVICE  PROGRESS NOTE  Date of Service: 07/24/2018  Kelsey Jensen  [id number]:[id number]  DOB: 10-11-1950   DOA: 06/28/2018  Referring Physician: Carron Curie, MD  HPI: Kelsey Jensen is a 68 y.o. female seen for follow up of Acute on Chronic Respiratory Failure.  Patient has been decannulated doing well on 3 L of oxygen via nasal cannula at this time.  Minimal secretions noted.  Medications: Reviewed on Rounds  Physical Exam:  Vitals: Pulse 81 respiration 17 BP 112/77 O2 sat 99% temp 7.4  Ventilator Settings patient's not currently on ventilator  . General: Comfortable at this time . Eyes: Grossly normal lids, irises & conjunctiva . ENT: grossly tongue is normal . Neck: no obvious mass . Cardiovascular: S1 S2 normal no gallop . Respiratory: No rales or rhonchi noted . Abdomen: soft . Skin: no rash seen on limited exam . Musculoskeletal: not rigid . Psychiatric:unable to assess . Neurologic: no seizure no involuntary movements         Lab Data:   Basic Metabolic Panel: Recent Labs  Lab 07/22/18 0611 07/23/18 0538  NA 144  --   K 3.4* 3.7  CL 103  --   CO2 29  --   GLUCOSE 92  --   BUN 35*  --   CREATININE 0.82  --   CALCIUM 8.7*  --     ABG: No results for input(s): PHART, PCO2ART, PO2ART, HCO3, O2SAT in the last 168 hours.  Liver Function Tests: No results for input(s): AST, ALT, ALKPHOS, BILITOT, PROT, ALBUMIN in the last 168 hours. No results for input(s): LIPASE, AMYLASE in the last 168 hours. No results for input(s): AMMONIA in the last 168 hours.  CBC: Recent Labs  Lab 07/22/18 0611  WBC 9.2  HGB 10.6*  HCT 35.9*  MCV 93.2  PLT 363    Cardiac Enzymes: No results for input(s): CKTOTAL, CKMB, CKMBINDEX, TROPONINI in the last 168 hours.  BNP (last 3 results) No results for input(s): BNP in the last 8760 hours.  ProBNP (last 3 results) No results for  input(s): PROBNP in the last 8760 hours.  Radiological Exams: No results found.  Assessment/Plan Active Problems:   Acute on chronic respiratory failure with hypoxia (HCC)   Severe sepsis with septic shock (HCC)   Chronic congestive heart failure (HCC)   AF (paroxysmal atrial fibrillation) (HCC)   1. Acute on chronic respiratory failure with hypoxia we will continue with oxygen therapy via nasal cannula. 2. Severe sepsis with shock he dynamically stable 3. CHF stable continue present management 4. Atrial fibrillation rate controlled at this time   I have personally seen and evaluated the patient, evaluated laboratory and imaging results, formulated the assessment and plan and placed orders. The Patient requires high complexity decision making for assessment and support.  Case was discussed on Rounds with the Respiratory Therapy Staff  Yevonne Pax, MD [facility] Pulmonary Critical Care Medicine Sleep Medicine

## 2018-07-25 DIAGNOSIS — I480 Paroxysmal atrial fibrillation: Secondary | ICD-10-CM | POA: Diagnosis not present

## 2018-07-25 DIAGNOSIS — I509 Heart failure, unspecified: Secondary | ICD-10-CM | POA: Diagnosis not present

## 2018-07-25 DIAGNOSIS — A419 Sepsis, unspecified organism: Secondary | ICD-10-CM | POA: Diagnosis not present

## 2018-07-25 DIAGNOSIS — J9621 Acute and chronic respiratory failure with hypoxia: Secondary | ICD-10-CM | POA: Diagnosis not present

## 2018-07-25 NOTE — Progress Notes (Addendum)
Pulmonary Critical Care Medicine [facility] [place]   PULMONARY CRITICAL CARE SERVICE  PROGRESS NOTE  Date of Service: 07/25/2018  Kelsey Jensen  [id number]:[id number]  DOB: [birth year]   DOA: 06/28/2018  Referring Physician: Carron Curie, MD  HPI: Kelsey Jensen is a 68 y.o. female seen for follow up of Acute on Chronic Respiratory Failure.  Patient continues to do well since decannulation.  She remains on oxygen via nasal cannula at 3 L/min.  Minimal secretions noted.  Medications: Reviewed on Rounds  Physical Exam:  Vitals: Pulse 82 respirations 21 BP 121/86 O2 sat 96% temp 98.2  Ventilator Settings patient's not currently on ventilator  . General: Comfortable at this time . Eyes: Grossly normal lids, irises & conjunctiva . ENT: grossly tongue is normal . Neck: no obvious mass . Cardiovascular: S1 S2 normal no gallop . Respiratory: No rales or rhonchi noted . Abdomen: soft . Skin: no rash seen on limited exam . Musculoskeletal: not rigid . Psychiatric:unable to assess . Neurologic: no seizure no involuntary movements         Lab Data:   Basic Metabolic Panel: Recent Labs  Lab 07/22/18 0611 07/23/18 0538  NA 144  --   K 3.4* 3.7  CL 103  --   CO2 29  --   GLUCOSE 92  --   BUN 35*  --   CREATININE 0.82  --   CALCIUM 8.7*  --     ABG: No results for input(s): PHART, PCO2ART, PO2ART, HCO3, O2SAT in the last 168 hours.  Liver Function Tests: No results for input(s): AST, ALT, ALKPHOS, BILITOT, PROT, ALBUMIN in the last 168 hours. No results for input(s): LIPASE, AMYLASE in the last 168 hours. No results for input(s): AMMONIA in the last 168 hours.  CBC: Recent Labs  Lab 07/22/18 0611  WBC 9.2  HGB 10.6*  HCT 35.9*  MCV 93.2  PLT 363    Cardiac Enzymes: No results for input(s): CKTOTAL, CKMB, CKMBINDEX, TROPONINI in the last 168 hours.  BNP (last 3 results) No results for input(s): BNP in the last 8760 hours.  ProBNP (last 3 results) No  results for input(s): PROBNP in the last 8760 hours.  Radiological Exams: No results found.  Assessment/Plan Active Problems:   Acute on chronic respiratory failure with hypoxia (HCC)   Severe sepsis with septic shock (HCC)   Chronic congestive heart failure (HCC)   AF (paroxysmal atrial fibrillation) (HCC)   1. Acute on chronic respiratory failure with hypoxia continue oxygen therapy via nasal cannula. 2. Severe sepsis with shock hemodynamically stable 3. CHF stable continue present management 4. Atrial fibrillation rate controlled   I have personally seen and evaluated the patient, evaluated laboratory and imaging results, formulated the assessment and plan and placed orders. The Patient requires high complexity decision making for assessment and support.  Case was discussed on Rounds with the Respiratory Therapy Staff  Yevonne Pax, MD [facility] Pulmonary Critical Care Medicine Sleep Medicine

## 2018-07-26 ENCOUNTER — Other Ambulatory Visit (HOSPITAL_COMMUNITY): Payer: Medicare Other

## 2018-07-27 LAB — BASIC METABOLIC PANEL
ANION GAP: 15 (ref 5–15)
BUN: 37 mg/dL — ABNORMAL HIGH (ref 8–23)
CO2: 28 mmol/L (ref 22–32)
Calcium: 9 mg/dL (ref 8.9–10.3)
Chloride: 98 mmol/L (ref 98–111)
Creatinine, Ser: 1.06 mg/dL — ABNORMAL HIGH (ref 0.44–1.00)
GFR calc non Af Amer: 54 mL/min — ABNORMAL LOW (ref >60)
Glucose, Bld: 187 mg/dL — ABNORMAL HIGH (ref 70–99)
Potassium: 4.2 mmol/L (ref 3.5–5.1)
Sodium: 141 mmol/L (ref 135–145)

## 2018-07-27 LAB — CBC
HCT: 38.3 % (ref 36.0–46.0)
Hemoglobin: 11.7 g/dL — ABNORMAL LOW (ref 12.0–15.0)
MCH: 28.4 pg (ref 26.0–34.0)
MCHC: 30.5 g/dL (ref 30.0–36.0)
MCV: 93 fL (ref 80.0–100.0)
NRBC: 0 % (ref 0.0–0.2)
Platelets: 292 10*3/uL (ref 150–400)
RBC: 4.12 MIL/uL (ref 3.87–5.11)
RDW: 23.5 % — ABNORMAL HIGH (ref 11.5–15.5)
WBC: 13.4 10*3/uL — ABNORMAL HIGH (ref 4.0–10.5)

## 2018-07-28 DIAGNOSIS — J9621 Acute and chronic respiratory failure with hypoxia: Secondary | ICD-10-CM | POA: Diagnosis not present

## 2018-07-28 DIAGNOSIS — I480 Paroxysmal atrial fibrillation: Secondary | ICD-10-CM | POA: Diagnosis not present

## 2018-07-28 DIAGNOSIS — I509 Heart failure, unspecified: Secondary | ICD-10-CM | POA: Diagnosis not present

## 2018-07-28 DIAGNOSIS — R0902 Hypoxemia: Secondary | ICD-10-CM | POA: Diagnosis not present

## 2018-07-28 NOTE — Progress Notes (Addendum)
[facility] [place]   PULMONARY CRITICAL CARE SERVICE  PROGRESS NOTE  Date of Service: 07/28/2018  Kelsey ClinesBrenda T Jensen  ZOX:[id number]:[id number]  DOB: 11/16/1950   DOA: 06/28/2018  Referring Physician: Carron CurieAli Hijazi, MD  HPI: Kelsey Jensen is a 68 y.o. female seen for follow up of Acute on Chronic Respiratory Failure.  Patient was recently on high flow nasal cannula at 8 liters and 40% FiO2.  She was doing well until she began to exert herself with some movements with PT.  She desaturated and respiratory had to increase her high flow to 15 L of oxygen 45% FiO2.  Medications: Reviewed on Rounds  Physical Exam:  Vitals: Pulse 86 respirations 20 P1 42/57 O2 sat 99% temp 97.9  Ventilator Settings patient's not on ventilator she is currently on high flow cannula.  . General: Comfortable at this time . Eyes: Grossly normal lids, irises & conjunctiva . ENT: grossly tongue is normal . Neck: no obvious mass . Cardiovascular: S1 S2 normal no gallop . Respiratory: No rales or rhonchi noted . Abdomen: soft . Skin: no rash seen on limited exam . Musculoskeletal: not rigid . Psychiatric:unable to assess . Neurologic: no seizure no involuntary movements         Lab Data:   Basic Metabolic Panel: Recent Labs  Lab 07/22/18 0611 07/23/18 0538 07/27/18 1316  NA 144  --  141  K 3.4* 3.7 4.2  CL 103  --  98  CO2 29  --  28  GLUCOSE 92  --  187*  BUN 35*  --  37*  CREATININE 0.82  --  1.06*  CALCIUM 8.7*  --  9.0    ABG: No results for input(s): PHART, PCO2ART, PO2ART, HCO3, O2SAT in the last 168 hours.  Liver Function Tests: No results for input(s): AST, ALT, ALKPHOS, BILITOT, PROT, ALBUMIN in the last 168 hours. No results for input(s): LIPASE, AMYLASE in the last 168 hours. No results for input(s): AMMONIA in the last 168 hours.  CBC: Recent Labs  Lab 07/22/18 0611 07/27/18 1316  WBC 9.2 13.4*  HGB 10.6* 11.7*  HCT 35.9* 38.3  MCV 93.2  93.0  PLT 363 292    Cardiac Enzymes: No results for input(s): CKTOTAL, CKMB, CKMBINDEX, TROPONINI in the last 168 hours.  BNP (last 3 results) No results for input(s): BNP in the last 8760 hours.  ProBNP (last 3 results) No results for input(s): PROBNP in the last 8760 hours.  Radiological Exams: No results found.  Assessment/Plan Active Problems:   Acute on chronic respiratory failure with hypoxia (HCC)   Severe sepsis with septic shock (HCC)   Chronic congestive heart failure (HCC)   AF (paroxysmal atrial fibrillation) (HCC)   1. Acute on chronic respiratory failure with hypoxia continue oxygen therapy via high flow nasal cannula. 2. Severe sepsis with shock hemodynamically stable 3. CHF stable present management 4. Atrial fibrillation rate controlled   I have personally seen and evaluated the patient, evaluated laboratory and imaging results, formulated the assessment and plan and placed orders. The Patient requires high complexity decision making for assessment and support.  Case was discussed on Rounds with the Respiratory Therapy Staff  Yevonne PaxSaadat A Khan, MD [facility] Pulmonary Critical Care Medicine Sleep Medicine

## 2018-07-29 ENCOUNTER — Other Ambulatory Visit (HOSPITAL_COMMUNITY): Payer: Medicare Other

## 2018-07-29 DIAGNOSIS — A419 Sepsis, unspecified organism: Secondary | ICD-10-CM | POA: Diagnosis not present

## 2018-07-29 DIAGNOSIS — I509 Heart failure, unspecified: Secondary | ICD-10-CM | POA: Diagnosis not present

## 2018-07-29 DIAGNOSIS — J9621 Acute and chronic respiratory failure with hypoxia: Secondary | ICD-10-CM | POA: Diagnosis not present

## 2018-07-29 DIAGNOSIS — I480 Paroxysmal atrial fibrillation: Secondary | ICD-10-CM | POA: Diagnosis not present

## 2018-07-29 NOTE — Progress Notes (Addendum)
Pulmonary Critical Care Medicine [facility] [place]   PULMONARY CRITICAL CARE SERVICE  PROGRESS NOTE  Date of Service: 07/29/2018  Kelsey Jensen  PZW:[id number]  DOB: 12-03-1950   DOA: 06/28/2018  Referring Physician: Carron Curie, MD  HPI: Kelsey Jensen is a 68 y.o. female seen for follow up of Acute on Chronic Respiratory Failure.  Patient continues to do well on high flow heated nasal cannula 15 L and 45% FiO2 there is a question of whether she has aspirated recently and blood cultures were sent today.  Medications: Reviewed on Rounds  Physical Exam:  Vitals: Pulse 97 respirations 20 BP 107/71 O2 sat 96% temp 97.9  Ventilator Settings patient's not currently on ventilator she remains on high flow nasal cannula.  . General: Comfortable at this time . Eyes: Grossly normal lids, irises & conjunctiva . ENT: grossly tongue is normal . Neck: no obvious mass . Cardiovascular: S1 S2 normal no gallop . Respiratory: No rales or rhonchi noted . Abdomen: soft . Skin: no rash seen on limited exam . Musculoskeletal: not rigid . Psychiatric:unable to assess . Neurologic: no seizure no involuntary movements         Lab Data:   Basic Metabolic Panel: Recent Labs  Lab 07/23/18 0538 07/27/18 1316  NA  --  141  K 3.7 4.2  CL  --  98  CO2  --  28  GLUCOSE  --  187*  BUN  --  37*  CREATININE  --  1.06*  CALCIUM  --  9.0    ABG: No results for input(s): PHART, PCO2ART, PO2ART, HCO3, O2SAT in the last 168 hours.  Liver Function Tests: No results for input(s): AST, ALT, ALKPHOS, BILITOT, PROT, ALBUMIN in the last 168 hours. No results for input(s): LIPASE, AMYLASE in the last 168 hours. No results for input(s): AMMONIA in the last 168 hours.  CBC: Recent Labs  Lab 07/27/18 1316  WBC 13.4*  HGB 11.7*  HCT 38.3  MCV 93.0  PLT 292    Cardiac Enzymes: No results for input(s): CKTOTAL, CKMB, CKMBINDEX, TROPONINI in the last 168 hours.  BNP (last 3  results) No results for input(s): BNP in the last 8760 hours.  ProBNP (last 3 results) No results for input(s): PROBNP in the last 8760 hours.  Radiological Exams: Dg Chest Port 1 View  Result Date: 07/29/2018 CLINICAL DATA:  Oxygen desaturation EXAM: PORTABLE CHEST 1 VIEW COMPARISON:  Portable exam 1047 hours compared to 07/26/2018 FINDINGS: Enlargement of cardiac silhouette. Atherosclerotic calcification aorta. Mediastinal contours and pulmonary vascularity normal. Streaky atelectasis at medial LEFT upper lobe. Lungs otherwise clear. No acute infiltrate, pleural effusion or pneumothorax. Bones demineralized. IMPRESSION: Enlargement of cardiac silhouette with streaky atelectasis in LEFT upper lobe. Electronically Signed   By: Ulyses Southward M.D.   On: 07/29/2018 12:32    Assessment/Plan Active Problems:   Acute on chronic respiratory failure with hypoxia (HCC)   Severe sepsis with septic shock (HCC)   Chronic congestive heart failure (HCC)   AF (paroxysmal atrial fibrillation) (HCC)   1. Acute on chronic respiratory failure with hypoxia continue oxygen therapy via high flow nasal cannula and titrate as tolerated. 2. Severe sepsis with shock hemodynamically stable 3. CHF stable continue present management 4. Atrial fibrillation rate controlled   I have personally seen and evaluated the patient, evaluated laboratory and imaging results, formulated the assessment and plan and placed orders. The Patient requires high complexity decision making for assessment and support.  Case was  discussed on Rounds with the Respiratory Therapy Staff  Allyne Gee, MD [facility] Pulmonary Critical Care Medicine Sleep Medicine

## 2018-07-30 DIAGNOSIS — J9621 Acute and chronic respiratory failure with hypoxia: Secondary | ICD-10-CM | POA: Diagnosis not present

## 2018-07-30 DIAGNOSIS — I509 Heart failure, unspecified: Secondary | ICD-10-CM | POA: Diagnosis not present

## 2018-07-30 DIAGNOSIS — R0902 Hypoxemia: Secondary | ICD-10-CM | POA: Diagnosis not present

## 2018-07-30 DIAGNOSIS — I480 Paroxysmal atrial fibrillation: Secondary | ICD-10-CM | POA: Diagnosis not present

## 2018-07-30 LAB — BLOOD GAS, ARTERIAL
Acid-Base Excess: 4.6 mmol/L — ABNORMAL HIGH (ref 0.0–2.0)
Bicarbonate: 28.9 mmol/L — ABNORMAL HIGH (ref 20.0–28.0)
FIO2: 50
O2 Content: 15 L/min
O2 SAT: 95.1 %
PCO2 ART: 45.9 mmHg (ref 32.0–48.0)
Patient temperature: 98.8
pH, Arterial: 7.416 (ref 7.350–7.450)
pO2, Arterial: 76.9 mmHg — ABNORMAL LOW (ref 83.0–108.0)

## 2018-07-30 NOTE — Progress Notes (Addendum)
Pulmonary Critical Care Medicine [facility] [place]   PULMONARY CRITICAL CARE SERVICE  PROGRESS NOTE  Date of Service: 07/30/2018  Kelsey Jensen  ITG:[id number]  DOB: 10-13-1950   DOA: 06/28/2018  Referring Physician: Carron Curie, MD  HPI: Kelsey Jensen is a 68 y.o. female seen for follow up of Acute on Chronic Respiratory Failure.  Patient remains on heated high flow nasal cannula she remains at 15 L however her FiO2 was increased to 50% due to an ABG that showed hypoxia.  Patient's PO2 was in the 70s.  Chest CT has been ordered and we await those results.  Medications: Reviewed on Rounds  Physical Exam:  Vitals: Pulse 76 respirations 21 BP 98/60 O2 sat 97% temp 97.2  Ventilator Settings patient's not currently on ventilator  . General: Comfortable at this time . Eyes: Grossly normal lids, irises & conjunctiva . ENT: grossly tongue is normal . Neck: no obvious mass . Cardiovascular: S1 S2 normal no gallop . Respiratory: No rales or rhonchi noted . Abdomen: soft . Skin: no rash seen on limited exam . Musculoskeletal: not rigid . Psychiatric:unable to assess . Neurologic: no seizure no involuntary movements         Lab Data:   Basic Metabolic Panel: Recent Labs  Lab 07/27/18 1316  NA 141  K 4.2  CL 98  CO2 28  GLUCOSE 187*  BUN 37*  CREATININE 1.06*  CALCIUM 9.0    ABG: Recent Labs  Lab 07/30/18 1520  PHART 7.416  PCO2ART 45.9  PO2ART 76.9*  HCO3 28.9*  O2SAT 95.1    Liver Function Tests: No results for input(s): AST, ALT, ALKPHOS, BILITOT, PROT, ALBUMIN in the last 168 hours. No results for input(s): LIPASE, AMYLASE in the last 168 hours. No results for input(s): AMMONIA in the last 168 hours.  CBC: Recent Labs  Lab 07/27/18 1316  WBC 13.4*  HGB 11.7*  HCT 38.3  MCV 93.0  PLT 292    Cardiac Enzymes: No results for input(s): CKTOTAL, CKMB, CKMBINDEX, TROPONINI in the last 168 hours.  BNP (last 3 results) No results for  input(s): BNP in the last 8760 hours.  ProBNP (last 3 results) No results for input(s): PROBNP in the last 8760 hours.  Radiological Exams: Dg Chest Port 1 View  Result Date: 07/29/2018 CLINICAL DATA:  Oxygen desaturation EXAM: PORTABLE CHEST 1 VIEW COMPARISON:  Portable exam 1047 hours compared to 07/26/2018 FINDINGS: Enlargement of cardiac silhouette. Atherosclerotic calcification aorta. Mediastinal contours and pulmonary vascularity normal. Streaky atelectasis at medial LEFT upper lobe. Lungs otherwise clear. No acute infiltrate, pleural effusion or pneumothorax. Bones demineralized. IMPRESSION: Enlargement of cardiac silhouette with streaky atelectasis in LEFT upper lobe. Electronically Signed   By: Ulyses Southward M.D.   On: 07/29/2018 12:32    Assessment/Plan Active Problems:   Acute on chronic respiratory failure with hypoxia (HCC)   Severe sepsis with septic shock (HCC)   Chronic congestive heart failure (HCC)   AF (paroxysmal atrial fibrillation) (HCC)   1. Acute on chronic respiratory failure with hypoxia continue oxygen therapy via high flow nasal cannula and titrate as tolerated. 2. Severe sepsis with shock hemodynamically stable 3. CHF stable continue present management 4. Atrial fibrillation rate controlled   I have personally seen and evaluated the patient, evaluated laboratory and imaging results, formulated the assessment and plan and placed orders. The Patient requires high complexity decision making for assessment and support.  Case was discussed on Rounds with the Respiratory Therapy Staff  Allyne Gee, MD [facility] Pulmonary Critical Care Medicine Sleep Medicine

## 2018-07-31 DIAGNOSIS — I509 Heart failure, unspecified: Secondary | ICD-10-CM | POA: Diagnosis not present

## 2018-07-31 DIAGNOSIS — I480 Paroxysmal atrial fibrillation: Secondary | ICD-10-CM | POA: Diagnosis not present

## 2018-07-31 DIAGNOSIS — R0902 Hypoxemia: Secondary | ICD-10-CM | POA: Diagnosis not present

## 2018-07-31 DIAGNOSIS — J9621 Acute and chronic respiratory failure with hypoxia: Secondary | ICD-10-CM | POA: Diagnosis not present

## 2018-07-31 LAB — BRAIN NATRIURETIC PEPTIDE: B Natriuretic Peptide: 294.8 pg/mL — ABNORMAL HIGH (ref 0.0–100.0)

## 2018-07-31 NOTE — Progress Notes (Addendum)
Pulmonary Critical Care Medicine [facility] [place]   PULMONARY CRITICAL CARE SERVICE  PROGRESS NOTE  Date of Service: 07/31/2018  Kelsey Jensen  FWY:[id number]  DOB: Mar 05, 1951   DOA: 06/28/2018  Referring Physician: Carron Curie, MD  HPI: Kelsey Jensen is a 68 y.o. female seen for follow up of Acute on Chronic Respiratory Failure.  Patient continues on heated high flow nasal cannula.  Her high flow was increased to 20 L with a 70% FiO2.  Patient appears relaxed and in no distress at this time.  Medications: Reviewed on Rounds  Physical Exam:  Vitals: Pulse 97 respirations 25 BP 121/65 O2 sat 96% temp 97.4  Ventilator Settings patient not currently on ventilator  . General: Comfortable at this time . Eyes: Grossly normal lids, irises & conjunctiva . ENT: grossly tongue is normal . Neck: no obvious mass . Cardiovascular: S1 S2 normal no gallop . Respiratory: No rales or rhonchi noted . Abdomen: soft . Skin: no rash seen on limited exam . Musculoskeletal: not rigid . Psychiatric:unable to assess . Neurologic: no seizure no involuntary movements         Lab Data:   Basic Metabolic Panel: Recent Labs  Lab 07/27/18 1316  NA 141  K 4.2  CL 98  CO2 28  GLUCOSE 187*  BUN 37*  CREATININE 1.06*  CALCIUM 9.0    ABG: Recent Labs  Lab 07/30/18 1520  PHART 7.416  PCO2ART 45.9  PO2ART 76.9*  HCO3 28.9*  O2SAT 95.1    Liver Function Tests: No results for input(s): AST, ALT, ALKPHOS, BILITOT, PROT, ALBUMIN in the last 168 hours. No results for input(s): LIPASE, AMYLASE in the last 168 hours. No results for input(s): AMMONIA in the last 168 hours.  CBC: Recent Labs  Lab 07/27/18 1316  WBC 13.4*  HGB 11.7*  HCT 38.3  MCV 93.0  PLT 292    Cardiac Enzymes: No results for input(s): CKTOTAL, CKMB, CKMBINDEX, TROPONINI in the last 168 hours.  BNP (last 3 results) Recent Labs    07/31/18 0918  BNP 294.8*    ProBNP (last 3 results) No  results for input(s): PROBNP in the last 8760 hours.  Radiological Exams: No results found.  Assessment/Plan Active Problems:   Acute on chronic respiratory failure with hypoxia (HCC)   Severe sepsis with septic shock (HCC)   Chronic congestive heart failure (HCC)   AF (paroxysmal atrial fibrillation) (HCC)   1. Acute on chronic respiratory failure with hypoxia continue oxygen therapy high flow nasal cannula and titrate as tolerated. 2. Severe sepsis with shock hemodynamically stable 3. CHF stable continue present manage 4. Atrial fibrillation rate controlled   I have personally seen and evaluated the patient, evaluated laboratory and imaging results, formulated the assessment and plan and placed orders. The Patient requires high complexity decision making for assessment and support.  Case was discussed on Rounds with the Respiratory Therapy Staff  Yevonne Pax, MD [facility] Pulmonary Critical Care Medicine Sleep Medicine

## 2018-08-01 ENCOUNTER — Other Ambulatory Visit (HOSPITAL_COMMUNITY): Payer: Medicare Other

## 2018-08-01 DIAGNOSIS — I480 Paroxysmal atrial fibrillation: Secondary | ICD-10-CM | POA: Diagnosis not present

## 2018-08-01 DIAGNOSIS — R0902 Hypoxemia: Secondary | ICD-10-CM | POA: Diagnosis not present

## 2018-08-01 DIAGNOSIS — I509 Heart failure, unspecified: Secondary | ICD-10-CM | POA: Diagnosis not present

## 2018-08-01 DIAGNOSIS — J9621 Acute and chronic respiratory failure with hypoxia: Secondary | ICD-10-CM | POA: Diagnosis not present

## 2018-08-01 LAB — CBC WITH DIFFERENTIAL/PLATELET
Band Neutrophils: 0 %
Basophils Absolute: 0 10*3/uL (ref 0.0–0.1)
Basophils Relative: 0 %
Blasts: 0 %
Eosinophils Absolute: 0 10*3/uL (ref 0.0–0.5)
Eosinophils Relative: 0 %
HCT: 37.6 % (ref 36.0–46.0)
Hemoglobin: 11.3 g/dL — ABNORMAL LOW (ref 12.0–15.0)
Lymphocytes Relative: 9 %
Lymphs Abs: 1.1 10*3/uL (ref 0.7–4.0)
MCH: 28.9 pg (ref 26.0–34.0)
MCHC: 30.1 g/dL (ref 30.0–36.0)
MCV: 96.2 fL (ref 80.0–100.0)
MYELOCYTES: 0 %
Metamyelocytes Relative: 0 %
Monocytes Absolute: 0.8 10*3/uL (ref 0.1–1.0)
Monocytes Relative: 7 %
NEUTROS PCT: 84 %
NRBC: 0 /100{WBCs}
Neutro Abs: 9.8 10*3/uL — ABNORMAL HIGH (ref 1.7–7.7)
OTHER: 0 %
PROMYELOCYTES RELATIVE: 0 %
Platelets: 253 10*3/uL (ref 150–400)
RBC: 3.91 MIL/uL (ref 3.87–5.11)
RDW: 22.9 % — ABNORMAL HIGH (ref 11.5–15.5)
WBC: 11.7 10*3/uL — ABNORMAL HIGH (ref 4.0–10.5)
nRBC: 0 % (ref 0.0–0.2)

## 2018-08-01 LAB — HEPARIN LEVEL (UNFRACTIONATED): Heparin Unfractionated: <0.1 IU/mL — ABNORMAL LOW (ref 0.30–0.70)

## 2018-08-01 LAB — PROTIME-INR
INR: 0.96
Prothrombin Time: 12.7 seconds (ref 11.4–15.2)

## 2018-08-01 LAB — APTT: aPTT: 27 seconds (ref 24–36)

## 2018-08-01 MED ORDER — IOHEXOL 300 MG/ML  SOLN
75.0000 mL | Freq: Once | INTRAMUSCULAR | Status: AC | PRN
  Administered 2018-08-01: 75 mL via INTRAVENOUS

## 2018-08-01 NOTE — Progress Notes (Addendum)
Pulmonary Critical Care Medicine [facility] [place]   PULMONARY CRITICAL CARE SERVICE  PROGRESS NOTE  Date of Service: 08/01/2018  Kelsey Jensen  [id number]  DOB: 1950/07/04   DOA: 06/28/2018  Referring Physician: Carron Curie, MD  HPI: Kelsey Jensen is a 68 y.o. female seen for follow up of Acute on Chronic Respiratory Failure.  Patient continues to be on heated high flow nasal cannula.  RT attempted to decrease patient's FiO2 today without success.  Currently on 20 L of oxygen with a 70% FiO2.  Patient continues to have thick secretions.  Medications: Reviewed on Rounds  Physical Exam:  Vitals: Pulse 86 respirations 19 BP 102/75 O2 sat 99% temp 97.6  Ventilator Settings patient's not currently on ventilator  . General: Comfortable at this time . Eyes: Grossly normal lids, irises & conjunctiva . ENT: grossly tongue is normal . Neck: no obvious mass . Cardiovascular: S1 S2 normal no gallop . Respiratory: No rales or rhonchi noted . Abdomen: soft . Skin: no rash seen on limited exam . Musculoskeletal: not rigid . Psychiatric:unable to assess . Neurologic: no seizure no involuntary movements         Lab Data:   Basic Metabolic Panel: Recent Labs  Lab 07/27/18 1316  NA 141  K 4.2  CL 98  CO2 28  GLUCOSE 187*  BUN 37*  CREATININE 1.06*  CALCIUM 9.0    ABG: Recent Labs  Lab 07/30/18 1520  PHART 7.416  PCO2ART 45.9  PO2ART 76.9*  HCO3 28.9*  O2SAT 95.1    Liver Function Tests: No results for input(s): AST, ALT, ALKPHOS, BILITOT, PROT, ALBUMIN in the last 168 hours. No results for input(s): LIPASE, AMYLASE in the last 168 hours. No results for input(s): AMMONIA in the last 168 hours.  CBC: Recent Labs  Lab 07/27/18 1316  WBC 13.4*  HGB 11.7*  HCT 38.3  MCV 93.0  PLT 292    Cardiac Enzymes: No results for input(s): CKTOTAL, CKMB, CKMBINDEX, TROPONINI in the last 168 hours.  BNP (last 3 results) Recent Labs    07/31/18 0918   BNP 294.8*    ProBNP (last 3 results) No results for input(s): PROBNP in the last 8760 hours.  Radiological Exams: No results found.  Assessment/Plan Active Problems:   Acute on chronic respiratory failure with hypoxia (HCC)   Severe sepsis with septic shock (HCC)   Chronic congestive heart failure (HCC)   AF (paroxysmal atrial fibrillation) (HCC)   1. Acute on chronic respiratory failure with hypoxia continue oxygen therapy with high flow nasal cannula and titrate as tolerated. 2. Severe sepsis with shock hemodynamically stable 3. CHF stable continue present management 4. Atrial fibrillation rate controlled   I have personally seen and evaluated the patient, evaluated laboratory and imaging results, formulated the assessment and plan and placed orders. The Patient requires high complexity decision making for assessment and support.  Case was discussed on Rounds with the Respiratory Therapy Staff  Yevonne Pax, MD [facility] Pulmonary Critical Care Medicine Sleep Medicine

## 2018-08-02 ENCOUNTER — Encounter: Payer: Self-pay | Admitting: Internal Medicine

## 2018-08-02 ENCOUNTER — Other Ambulatory Visit (HOSPITAL_BASED_OUTPATIENT_CLINIC_OR_DEPARTMENT_OTHER): Payer: Medicare Other

## 2018-08-02 DIAGNOSIS — I2609 Other pulmonary embolism with acute cor pulmonale: Secondary | ICD-10-CM

## 2018-08-02 DIAGNOSIS — J9621 Acute and chronic respiratory failure with hypoxia: Secondary | ICD-10-CM | POA: Diagnosis not present

## 2018-08-02 DIAGNOSIS — I480 Paroxysmal atrial fibrillation: Secondary | ICD-10-CM | POA: Diagnosis not present

## 2018-08-02 DIAGNOSIS — I509 Heart failure, unspecified: Secondary | ICD-10-CM | POA: Diagnosis not present

## 2018-08-02 DIAGNOSIS — I2699 Other pulmonary embolism without acute cor pulmonale: Secondary | ICD-10-CM | POA: Diagnosis present

## 2018-08-02 LAB — BASIC METABOLIC PANEL
Anion gap: 12 (ref 5–15)
BUN: 36 mg/dL — ABNORMAL HIGH (ref 8–23)
CO2: 32 mmol/L (ref 22–32)
CREATININE: 1.01 mg/dL — AB (ref 0.44–1.00)
Calcium: 8.9 mg/dL (ref 8.9–10.3)
Chloride: 98 mmol/L (ref 98–111)
GFR calc Af Amer: >60 mL/min (ref >60)
GFR calc non Af Amer: 56 mL/min — ABNORMAL LOW (ref >60)
Glucose, Bld: 92 mg/dL (ref 70–99)
Potassium: 3.2 mmol/L — ABNORMAL LOW (ref 3.5–5.1)
Sodium: 142 mmol/L (ref 135–145)

## 2018-08-02 LAB — HEPARIN LEVEL (UNFRACTIONATED)
Heparin Unfractionated: 0.2 IU/mL — ABNORMAL LOW (ref 0.30–0.70)
Heparin Unfractionated: 0.25 IU/mL — ABNORMAL LOW (ref 0.30–0.70)
Heparin Unfractionated: 0.16 IU/mL — ABNORMAL LOW (ref 0.30–0.70)

## 2018-08-02 NOTE — Consult Note (Signed)
Referring Physician: Dr. Elray Mcgregor. Brown/Dr. Royann Shivers  Kelsey Jensen is an 68 y.o. female.                       Chief Complaint: Shortness of breath/ PE on CT scan with RV strain.  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    Past medical history as per HPI.    The histories are not reviewed yet. Please review them in the "History" navigator section and refresh this SmartLink.  Family history positive for type 2 DM, HTN, Cataracts, Cancer and glaucomaon file.  Social History:  has h/o smoking and alcohol use, no drugs.  Allergies: Sulfa  No medications prior to admission.    Results for orders placed or performed during the hospital encounter of 06/28/18 (from the past 48 hour(s))  CBC with Differential/Platelet     Status: Abnormal   Collection Time: 08/01/18 10:19 PM  Result Value Ref Range   WBC 11.7 (H) 4.0 - 10.5 K/uL   RBC 3.91 3.87 - 5.11 MIL/uL   Hemoglobin 11.3 (L) 12.0 - 15.0 g/dL   HCT 82.9 93.7 - 16.9 %   MCV 96.2 80.0 - 100.0 fL   MCH 28.9 26.0 - 34.0 pg   MCHC 30.1 30.0 - 36.0 g/dL   RDW 67.8 (H) 93.8 - 10.1 %   Platelets 253 150 - 400 K/uL   nRBC 0.0 0.0 - 0.2 %   Neutrophils Relative % 84 %   Lymphocytes Relative 9 %   Monocytes Relative 7 %   Eosinophils Relative 0 %   Basophils Relative 0 %   Band Neutrophils 0 %   Metamyelocytes Relative 0 %   Myelocytes 0 %   Promyelocytes Relative 0 %   Blasts 0 %   nRBC 0 0 /100 WBC   Other 0 %   Neutro Abs 9.8 (H) 1.7 - 7.7 K/uL   Lymphs Abs 1.1 0.7 - 4.0 K/uL   Monocytes Absolute 0.8 0.1 - 1.0 K/uL   Eosinophils Absolute 0.0 0.0 - 0.5 K/uL   Basophils Absolute 0.0 0.0 - 0.1 K/uL   RBC Morphology  BASOPHILIC STIPPLING     Comment: Performed at [facility], [address] [place], [place] [address]  Heparin level (unfractionated)     Status: Abnormal   Collection Time: 08/01/18 10:19 PM  Result Value Ref Range   Heparin Unfractionated <0.10 (L) 0.30 - 0.70 IU/mL    Comment: (NOTE) If heparin results are below expected values, and patient dosage has  been confirmed, suggest follow up testing of antithrombin III levels. Performed at [facility], [address] [place], [place] [address]   APTT     Status: None   Collection Time: 08/01/18 10:19 PM  Result Value Ref Range   aPTT 27 24 - 36 seconds    Comment: Performed at [facility], [address] [place], [place] [address]  Protime-INR     Status: None   Collection Time: 08/01/18 10:19 PM  Result Value Ref Range   Prothrombin Time 12.7 11.4 - 15.2 seconds   INR 0.96     Comment: Performed at [facility], [address] [place], [place] [address]  Basic metabolic panel     Status: Abnormal   Collection Time: 08/02/18  6:43 AM  Result Value Ref Range   Sodium 142 135 - 145 mmol/L   Potassium 3.2 (L) 3.5 - 5.1 mmol/L   Chloride 98 98 - 111 mmol/L   CO2 32 22 - 32 mmol/L   Glucose, Bld 92 70 - 99 mg/dL   BUN 36 (H) 8 - 23 mg/dL   Creatinine, Ser 8.52 (H) 0.44 - 1.00 mg/dL   Calcium 8.9 8.9 - 77.8 mg/dL   GFR calc non Af Amer 56 (L) >60 mL/min   GFR calc Af Amer >60 >60 mL/min   Anion gap 12 5 - 15    Comment: Performed at [facility], [address], [place], [place] [address]  Heparin level (unfractionated)     Status: Abnormal   Collection Time: 08/02/18  6:43 AM  Result Value Ref Range   Heparin Unfractionated 0.20 (L) 0.30 - 0.70 IU/mL    Comment: (NOTE) If heparin results are below expected values, and patient dosage has  been confirmed, suggest follow up testing of antithrombin III levels. Performed at [facility], [address] [place], [place] [address]   Heparin  level (unfractionated)     Status: Abnormal   Collection Time: 08/02/18  3:38 PM  Result Value Ref Range   Heparin Unfractionated 0.25 (L) 0.30 - 0.70 IU/mL    Comment: (NOTE) If heparin results are below expected values, and patient dosage has  been confirmed, suggest follow up testing of antithrombin III levels. Performed at [facility], [address], [place], [place] [address]    Ct Chest W Contrast  Result Date: 08/01/2018 CLINICAL DATA:  Sepsis. EXAM: CT CHEST WITH CONTRAST TECHNIQUE: Multidetector CT imaging of the chest was performed during intravenous contrast administration. CONTRAST:  74mL OMNIPAQUE IOHEXOL 300 MG/ML  SOLN COMPARISON:  Chest x-ray July 29, 2018 FINDINGS: Cardiovascular: Cardiomegaly is noted. Coronary artery calcifications are identified. The ascending thoracic aorta measures 4.1 cm in AP dimension. Mild atherosclerotic change. No dissection. Pulmonary emboli involve the right upper, middle, and anterior lower lobe branches. There is also involvement on the left but less than seen on the right. The right ventricle measures 5.1 cm in diameter in the left measures 3.8 cm. With a RV/LV ratio of 1.34. Mediastinum/Nodes: There is a moderate hiatal hernia. The esophagus and thyroid are otherwise normal. No adenopathy. No effusion. Lungs/Pleura: Central airways are normal. No pneumothorax. There is opacity in the medial right lung base with air bronchograms. There is more mild opacity in the medial aspect of the left upper and lower lobes. No nodules or masses. Upper Abdomen: There is a small cyst in the right kidney. Upper abdomen is otherwise unremarkable. Musculoskeletal: No chest wall abnormality. No acute or significant osseous findings. IMPRESSION: 1. Bilateral lobar pulmonary emboli, right greater than left, with an RV/LV ratio of 1.34 consistent with right heart strain. Positive for acute PE with CT evidence of right heart strain (RV/LV Ratio = 1.34) consistent  with at least submassive (intermediate risk) PE. The presence of right heart strain has been associated with an increased risk of morbidity and mortality. Please activate Code PE by paging [phone]. 2. Medial bibasilar opacities, right greater than left may represent atelectasis. However, infiltrate/pneumonia not excluded on the right. Infarct considered less likely on the right. 3. Cardiomegaly.  Coronary artery calcifications. 4. 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 5.  Atherosclerotic change in the thoracic aorta. 6. Moderate hiatal hernia. Findings being called to the referring clinical team. Aortic Atherosclerosis (ICD10-I70.0). Electronically Signed   By: Gerome Samavid  Williams III M.D   On: 08/01/2018 16:07    Review Of Systems Constitutional: Positive fever, chills, weight loss and gain. Eyes: No vision change, wears reading glasses. No discharge or pain. Ears: No hearing loss, No tinnitus. Respiratory: Positive asthma, COPD, pneumonias, shortness of breath. No hemoptysis. Cardiovascular: Positive chest pain, palpitation, leg edema. Gastrointestinal: No nausea, vomiting, diarrhea, constipation. Positive GI bleed. No hepatitis. Genitourinary: No dysuria, hematuria, kidney stone. No incontinance. Neurological: No headache, stroke, seizures.  Psychiatry: No psych facility admission for anxiety, depression, suicide. No detox. Skin: No rash. Musculoskeletal: Positive joint pain, no fibromyalgia. No neck pain, back pain. Lymphadenopathy: No lymphadenopathy. Hematology: Positive anemia and easy bruising.   There were no vitals taken for this visit. There is no height or weight on file to calculate BMI. General appearance: alert, cooperative, appears stated age and mild respiratory distress Head: Normocephalic, atraumatic. High flow heated and humidified oxygen by Nasal cannula. Eyes: Blue eyes, pink conjunctiva, corneas clear.  Neck: No adenopathy, no carotid bruit,  no JVD, supple, symmetrical, trachea midline and thyroid not enlarged. Resp: Clearing to auscultation bilaterally. Cardio: Irregular rate and rhythm, S1, S2 normal, II/VI systolic murmur, no click, rub or gallop GI: Soft, non-tender; bowel sounds normal; no organomegaly. Extremities: 1 + edema, no cyanosis or clubbing. Skin: Warm and dry.  Neurologic: Alert and oriented X 2, normal strength. Bed ridden.  Assessment/Plan Acute multiple bilateral pulmonary emboli Right heart strain Acute on chronic respiratory failure Acute CHF, HFpEF Hypokalemia H/O Pneumonia H/O sepsis H/O acute renal failure Obesity  Continue IV heparin. Not ideal candidate for surgery and long term anticoagulation due to aplastic anemia, h/o GI bleed. Start IV pantoprazole. Continue potassium supplementation. Monitor H/H periodically.  Ricki RodriguezAjay S Jameka Ivie, MD  08/02/2018, 7:08 PM

## 2018-08-02 NOTE — Progress Notes (Signed)
Pulmonary Critical Care Medicine [facility] [place]   PULMONARY CRITICAL CARE SERVICE  PROGRESS NOTE  Date of Service: 08/02/2018  Kelsey Jensen  YQM:[id number]  DOB: February 18, 1951   DOA: 06/28/2018  Referring Physician: Carron Curie, MD  HPI: Kelsey Jensen is a 68 y.o. female seen for follow up of Acute on Chronic Respiratory Failure.  Patient has been diagnosed with pulmonary embolism seen by cardiology today there was some issues with right heart strain.  Cardiology recommending continuing with heparin not a surgical candidate for any intervention.  Medications: Reviewed on Rounds  Physical Exam:  Vitals: Temperature 97.6 pulse 72 respiratory rate 17 blood pressure 105/64 saturations 98%  Ventilator Settings off the ventilator on nasal cannula  . General: Comfortable at this time . Eyes: Grossly normal lids, irises & conjunctiva . ENT: grossly tongue is normal . Neck: no obvious mass . Cardiovascular: S1 S2 normal no gallop . Respiratory: No rhonchi or rales are noted at this time . Abdomen: soft . Skin: no rash seen on limited exam . Musculoskeletal: not rigid . Psychiatric:unable to assess . Neurologic: no seizure no involuntary movements         Lab Data:   Basic Metabolic Panel: Recent Labs  Lab 07/27/18 1316 08/02/18 0643  NA 141 142  K 4.2 3.2*  CL 98 98  CO2 28 32  GLUCOSE 187* 92  BUN 37* 36*  CREATININE 1.06* 1.01*  CALCIUM 9.0 8.9    ABG: Recent Labs  Lab 07/30/18 1520  PHART 7.416  PCO2ART 45.9  PO2ART 76.9*  HCO3 28.9*  O2SAT 95.1    Liver Function Tests: No results for input(s): AST, ALT, ALKPHOS, BILITOT, PROT, ALBUMIN in the last 168 hours. No results for input(s): LIPASE, AMYLASE in the last 168 hours. No results for input(s): AMMONIA in the last 168 hours.  CBC: Recent Labs  Lab 07/27/18 1316 08/01/18 2219  WBC 13.4* 11.7*  NEUTROABS  --  9.8*  HGB 11.7* 11.3*  HCT 38.3 37.6  MCV 93.0 96.2  PLT 292 253     Cardiac Enzymes: No results for input(s): CKTOTAL, CKMB, CKMBINDEX, TROPONINI in the last 168 hours.  BNP (last 3 results) Recent Labs    07/31/18 0918  BNP 294.8*    ProBNP (last 3 results) No results for input(s): PROBNP in the last 8760 hours.  Radiological Exams: Ct Chest W Contrast  Result Date: 08/01/2018 CLINICAL DATA:  Sepsis. EXAM: CT CHEST WITH CONTRAST TECHNIQUE: Multidetector CT imaging of the chest was performed during intravenous contrast administration. CONTRAST:  43mL OMNIPAQUE IOHEXOL 300 MG/ML  SOLN COMPARISON:  Chest x-ray July 29, 2018 FINDINGS: Cardiovascular: Cardiomegaly is noted. Coronary artery calcifications are identified. The ascending thoracic aorta measures 4.1 cm in AP dimension. Mild atherosclerotic change. No dissection. Pulmonary emboli involve the right upper, middle, and anterior lower lobe branches. There is also involvement on the left but less than seen on the right. The right ventricle measures 5.1 cm in diameter in the left measures 3.8 cm. With a RV/LV ratio of 1.34. Mediastinum/Nodes: There is a moderate hiatal hernia. The esophagus and thyroid are otherwise normal. No adenopathy. No effusion. Lungs/Pleura: Central airways are normal. No pneumothorax. There is opacity in the medial right lung base with air bronchograms. There is more mild opacity in the medial aspect of the left upper and lower lobes. No nodules or masses. Upper Abdomen: There is a small cyst in the right kidney. Upper abdomen is otherwise unremarkable. Musculoskeletal: No chest  wall abnormality. No acute or significant osseous findings. IMPRESSION: 1. Bilateral lobar pulmonary emboli, right greater than left, with an RV/LV ratio of 1.34 consistent with right heart strain. Positive for acute PE with CT evidence of right heart strain (RV/LV Ratio = 1.34) consistent with at least submassive (intermediate risk) PE. The presence of right heart strain has been associated with an  increased risk of morbidity and mortality. Please activate Code PE by paging [phone]. 2. Medial bibasilar opacities, right greater than left may represent atelectasis. However, infiltrate/pneumonia not excluded on the right. Infarct considered less likely on the right. 3. Cardiomegaly.  Coronary artery calcifications. 4. 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 5. Atherosclerotic change in the thoracic aorta. 6. Moderate hiatal hernia. Findings being called to the referring clinical team. Aortic Atherosclerosis (ICD10-I70.0). Electronically Signed   By: Gerome Sam III M.D   On: 08/01/2018 16:07    Assessment/Plan Active Problems:   Acute on chronic respiratory failure with hypoxia (HCC)   Severe sepsis with septic shock (HCC)   Chronic congestive heart failure (HCC)   AF (paroxysmal atrial fibrillation) (HCC)   Acute pulmonary embolism (HCC)   1. Acute on chronic respiratory failure with hypoxia patient is only on nasal cannula at this point patient will be continued with supportive care 2. Acute pulmonary emboli patient is on anticoagulation in the form of heparin will be overlap 3. Sepsis resolved 4. Chronic congestive heart failure right now is compensated but did have right heart strain as noted cardiology is following 5. Paroxysmal atrial fibrillation currently the rate is controlled we will continue to monitor   I have personally seen and evaluated the patient, evaluated laboratory and imaging results, formulated the assessment and plan and placed orders. The Patient requires high complexity decision making for assessment and support.  Case was discussed on Rounds with the Respiratory Therapy Staff  Yevonne Pax, MD [facility] Pulmonary Critical Care Medicine Sleep Medicine

## 2018-08-02 NOTE — Progress Notes (Signed)
  Echocardiogram 2D Echocardiogram has been performed.  Leta Jungling M 08/02/2018, 8:28 AM

## 2018-08-03 DIAGNOSIS — J9621 Acute and chronic respiratory failure with hypoxia: Secondary | ICD-10-CM | POA: Diagnosis not present

## 2018-08-03 DIAGNOSIS — I509 Heart failure, unspecified: Secondary | ICD-10-CM | POA: Diagnosis not present

## 2018-08-03 DIAGNOSIS — I480 Paroxysmal atrial fibrillation: Secondary | ICD-10-CM | POA: Diagnosis not present

## 2018-08-03 DIAGNOSIS — I2609 Other pulmonary embolism with acute cor pulmonale: Secondary | ICD-10-CM | POA: Diagnosis not present

## 2018-08-03 LAB — CBC WITH DIFFERENTIAL/PLATELET
Abs Immature Granulocytes: 0.3 10*3/uL — ABNORMAL HIGH (ref 0.00–0.07)
Basophils Absolute: 0 10*3/uL (ref 0.0–0.1)
Basophils Relative: 0 %
Eosinophils Absolute: 0.1 10*3/uL (ref 0.0–0.5)
Eosinophils Relative: 1 %
HCT: 41.1 % (ref 36.0–46.0)
Hemoglobin: 12 g/dL (ref 12.0–15.0)
Lymphocytes Relative: 10 %
Lymphs Abs: 1.1 10*3/uL (ref 0.7–4.0)
MCH: 28 pg (ref 26.0–34.0)
MCHC: 29.2 g/dL — ABNORMAL LOW (ref 30.0–36.0)
MCV: 96 fL (ref 80.0–100.0)
Metamyelocytes Relative: 2 %
Monocytes Absolute: 0.4 10*3/uL (ref 0.1–1.0)
Monocytes Relative: 4 %
Myelocytes: 1 %
Neutro Abs: 8.7 10*3/uL — ABNORMAL HIGH (ref 1.7–7.7)
Neutrophils Relative %: 82 %
Platelets: 312 10*3/uL (ref 150–400)
RBC: 4.28 MIL/uL (ref 3.87–5.11)
RDW: 23.1 % — AB (ref 11.5–15.5)
WBC: 10.6 10*3/uL — ABNORMAL HIGH (ref 4.0–10.5)
nRBC: 0 % (ref 0.0–0.2)
nRBC: 0 /100 WBC

## 2018-08-03 LAB — PROTIME-INR
INR: 1.01
Prothrombin Time: 13.2 seconds (ref 11.4–15.2)

## 2018-08-03 LAB — HEPARIN LEVEL (UNFRACTIONATED)
Heparin Unfractionated: 0.28 IU/mL — ABNORMAL LOW (ref 0.30–0.70)
Heparin Unfractionated: <0.1 IU/mL — ABNORMAL LOW (ref 0.30–0.70)
Heparin Unfractionated: 0.55 IU/mL (ref 0.30–0.70)

## 2018-08-03 LAB — POTASSIUM: Potassium: 3.9 mmol/L (ref 3.5–5.1)

## 2018-08-03 NOTE — Progress Notes (Signed)
Pulmonary Critical Care Medicine [facility] [place]   PULMONARY CRITICAL CARE SERVICE  PROGRESS NOTE  Date of Service: 08/03/2018  Kelsey Jensen  BFX:[id number]  DOB: 28-Jun-1950   DOA: 06/28/2018  Referring Physician: Carron Curie, MD  HPI: Kelsey Jensen is a 68 y.o. female seen for follow up of Acute on Chronic Respiratory Failure.  Patient's oxygen has been slightly decreased was decreased down to 50% currently remains on high flow 15 L  Medications: Reviewed on Rounds  Physical Exam:  Vitals: Temperature 98.0 pulse 81 respiratory 21 blood pressure 135/88 saturations 96%  Ventilator Settings on high flow nasal cannula  . General: Comfortable at this time . Eyes: Grossly normal lids, irises & conjunctiva . ENT: grossly tongue is normal . Neck: no obvious mass . Cardiovascular: S1 S2 normal no gallop . Respiratory: No rhonchi or rales are noted at this time . Abdomen: soft . Skin: no rash seen on limited exam . Musculoskeletal: not rigid . Psychiatric:unable to assess . Neurologic: no seizure no involuntary movements         Lab Data:   Basic Metabolic Panel: Recent Labs  Lab 07/27/18 1316 08/02/18 0643 08/03/18 0537  NA 141 142  --   K 4.2 3.2* 3.9  CL 98 98  --   CO2 28 32  --   GLUCOSE 187* 92  --   BUN 37* 36*  --   CREATININE 1.06* 1.01*  --   CALCIUM 9.0 8.9  --     ABG: Recent Labs  Lab 07/30/18 1520  PHART 7.416  PCO2ART 45.9  PO2ART 76.9*  HCO3 28.9*  O2SAT 95.1    Liver Function Tests: No results for input(s): AST, ALT, ALKPHOS, BILITOT, PROT, ALBUMIN in the last 168 hours. No results for input(s): LIPASE, AMYLASE in the last 168 hours. No results for input(s): AMMONIA in the last 168 hours.  CBC: Recent Labs  Lab 07/27/18 1316 08/01/18 2219 08/03/18 0537  WBC 13.4* 11.7* 10.6*  NEUTROABS  --  9.8* 8.7*  HGB 11.7* 11.3* 12.0  HCT 38.3 37.6 41.1  MCV 93.0 96.2 96.0  PLT 292 253 312    Cardiac Enzymes: No  results for input(s): CKTOTAL, CKMB, CKMBINDEX, TROPONINI in the last 168 hours.  BNP (last 3 results) Recent Labs    07/31/18 0918  BNP 294.8*    ProBNP (last 3 results) No results for input(s): PROBNP in the last 8760 hours.  Radiological Exams: Ct Chest W Contrast  Addendum Date: 08/03/2018   ADDENDUM REPORT: 08/03/2018 08:29 ADDENDUM: The findings were called to Dr. Odis Luster by myself shortly after the study was performed. Electronically Signed   By: Gerome Sam III M.D   On: 08/03/2018 08:29   Result Date: 08/03/2018 CLINICAL DATA:  Sepsis. EXAM: CT CHEST WITH CONTRAST TECHNIQUE: Multidetector CT imaging of the chest was performed during intravenous contrast administration. CONTRAST:  21mL OMNIPAQUE IOHEXOL 300 MG/ML  SOLN COMPARISON:  Chest x-ray July 29, 2018 FINDINGS: Cardiovascular: Cardiomegaly is noted. Coronary artery calcifications are identified. The ascending thoracic aorta measures 4.1 cm in AP dimension. Mild atherosclerotic change. No dissection. Pulmonary emboli involve the right upper, middle, and anterior lower lobe branches. There is also involvement on the left but less than seen on the right. The right ventricle measures 5.1 cm in diameter in the left measures 3.8 cm. With a RV/LV ratio of 1.34. Mediastinum/Nodes: There is a moderate hiatal hernia. The esophagus and thyroid are otherwise normal. No adenopathy. No effusion.  Lungs/Pleura: Central airways are normal. No pneumothorax. There is opacity in the medial right lung base with air bronchograms. There is more mild opacity in the medial aspect of the left upper and lower lobes. No nodules or masses. Upper Abdomen: There is a small cyst in the right kidney. Upper abdomen is otherwise unremarkable. Musculoskeletal: No chest wall abnormality. No acute or significant osseous findings. IMPRESSION: 1. Bilateral lobar pulmonary emboli, right greater than left, with an RV/LV ratio of 1.34 consistent with right heart strain.  Positive for acute PE with CT evidence of right heart strain (RV/LV Ratio = 1.34) consistent with at least submassive (intermediate risk) PE. The presence of right heart strain has been associated with an increased risk of morbidity and mortality. Please activate Code PE by paging [phone]. 2. Medial bibasilar opacities, right greater than left may represent atelectasis. However, infiltrate/pneumonia not excluded on the right. Infarct considered less likely on the right. 3. Cardiomegaly.  Coronary artery calcifications. 4. 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 5. Atherosclerotic change in the thoracic aorta. 6. Moderate hiatal hernia. Findings being called to the referring clinical team. Aortic Atherosclerosis (ICD10-I70.0). Electronically Signed: By: Gerome Sam III M.D On: 08/01/2018 16:07    Assessment/Plan Active Problems:   Acute on chronic respiratory failure with hypoxia (HCC)   Severe sepsis with septic shock (HCC)   Chronic congestive heart failure (HCC)   AF (paroxysmal atrial fibrillation) (HCC)   Acute pulmonary embolism (HCC)   1. Acute on chronic respiratory failure with hypoxia patient is doing better with the oxygen will gradually weaning down. 2. Severe sepsis with septic shock improved 3. Acute pulmonary embolism patient is actually doing better clinically we will continue with anticoagulation should not a surgical candidate for intervention. 4. Chronic congestive heart failure followed by cardiology 5. Atrial fibrillation rate is controlled at this time we will continue with supportive care   I have personally seen and evaluated the patient, evaluated laboratory and imaging results, formulated the assessment and plan and placed orders. The Patient requires high complexity decision making for assessment and support.  Case was discussed on Rounds with the Respiratory Therapy Staff  Yevonne Pax, MD [facility] Pulmonary Critical Care  Medicine Sleep Medicine

## 2018-08-04 ENCOUNTER — Encounter (HOSPITAL_BASED_OUTPATIENT_CLINIC_OR_DEPARTMENT_OTHER): Payer: Medicare Other

## 2018-08-04 DIAGNOSIS — J9621 Acute and chronic respiratory failure with hypoxia: Secondary | ICD-10-CM | POA: Diagnosis not present

## 2018-08-04 DIAGNOSIS — R609 Edema, unspecified: Secondary | ICD-10-CM

## 2018-08-04 DIAGNOSIS — I480 Paroxysmal atrial fibrillation: Secondary | ICD-10-CM | POA: Diagnosis not present

## 2018-08-04 DIAGNOSIS — I509 Heart failure, unspecified: Secondary | ICD-10-CM | POA: Diagnosis not present

## 2018-08-04 DIAGNOSIS — I2609 Other pulmonary embolism with acute cor pulmonale: Secondary | ICD-10-CM | POA: Diagnosis not present

## 2018-08-04 LAB — CBC WITH DIFFERENTIAL/PLATELET
Band Neutrophils: 0 %
Basophils Absolute: 0 10*3/uL (ref 0.0–0.1)
Basophils Relative: 0 %
Blasts: 0 %
Eosinophils Absolute: 0 10*3/uL (ref 0.0–0.5)
Eosinophils Relative: 0 %
HCT: 38 % (ref 36.0–46.0)
Hemoglobin: 11.4 g/dL — ABNORMAL LOW (ref 12.0–15.0)
Lymphocytes Relative: 12 %
Lymphs Abs: 1.3 10*3/uL (ref 0.7–4.0)
MCH: 28.6 pg (ref 26.0–34.0)
MCHC: 30 g/dL (ref 30.0–36.0)
MCV: 95.5 fL (ref 80.0–100.0)
Metamyelocytes Relative: 0 %
Monocytes Absolute: 0.6 10*3/uL (ref 0.1–1.0)
Monocytes Relative: 6 %
Myelocytes: 0 %
NRBC: 0 /100{WBCs}
Neutro Abs: 8.8 10*3/uL — ABNORMAL HIGH (ref 1.7–7.7)
Neutrophils Relative %: 82 %
Other: 0 %
Platelets: 290 10*3/uL (ref 150–400)
Promyelocytes Relative: 0 %
RBC: 3.98 MIL/uL (ref 3.87–5.11)
RDW: 22.7 % — ABNORMAL HIGH (ref 11.5–15.5)
WBC: 10.7 10*3/uL — ABNORMAL HIGH (ref 4.0–10.5)
nRBC: 0 % (ref 0.0–0.2)

## 2018-08-04 LAB — HEPATIC FUNCTION PANEL
ALT: 10 U/L (ref 0–44)
AST: 12 U/L — ABNORMAL LOW (ref 15–41)
Albumin: 2.7 g/dL — ABNORMAL LOW (ref 3.5–5.0)
Alkaline Phosphatase: 66 U/L (ref 38–126)
BILIRUBIN TOTAL: 0.8 mg/dL (ref 0.3–1.2)
Bilirubin, Direct: 0.2 mg/dL (ref 0.0–0.2)
Indirect Bilirubin: 0.6 mg/dL (ref 0.3–0.9)
Total Protein: 6.3 g/dL — ABNORMAL LOW (ref 6.5–8.1)

## 2018-08-04 LAB — HEPARIN LEVEL (UNFRACTIONATED)
HEPARIN UNFRACTIONATED: 0.25 [IU]/mL — AB (ref 0.30–0.70)
Heparin Unfractionated: 0.15 IU/mL — ABNORMAL LOW (ref 0.30–0.70)

## 2018-08-04 LAB — PROTIME-INR
INR: 0.91
PROTHROMBIN TIME: 12.2 s (ref 11.4–15.2)

## 2018-08-04 NOTE — Progress Notes (Signed)
Bilateral lower extremity venous completed - Preliminary result found in Chart Review CV Proc. [place] Tyan Dy,RVS 08/04/2018, 3:26 PM

## 2018-08-04 NOTE — Progress Notes (Signed)
Pulmonary Critical Care Medicine [facility] [place]   PULMONARY CRITICAL CARE SERVICE  PROGRESS NOTE  Date of Service: 08/04/2018  Kelsey Jensen  RCB:[id number]  DOB: Jun 07, 1951   DOA: 06/28/2018  Referring Physician: Carron Curie, MD  HPI: Kelsey Jensen is a 68 y.o. female seen for follow up of Acute on Chronic Respiratory Failure.  Patient is on 40% FiO2 heated high flow she is doing better.  Continues on heparin  Medications: Reviewed on Rounds  Physical Exam:  Vitals: Temperature 98.7 pulse 74 respiratory 16 blood pressure 184/65 saturations 97%  Ventilator Settings currently off the ventilator on heated high flow  . General: Comfortable at this time . Eyes: Grossly normal lids, irises & conjunctiva . ENT: grossly tongue is normal . Neck: no obvious mass . Cardiovascular: S1 S2 normal no gallop . Respiratory: Scattered rhonchi expansion is equal . Abdomen: soft . Skin: no rash seen on limited exam . Musculoskeletal: not rigid . Psychiatric:unable to assess . Neurologic: no seizure no involuntary movements         Lab Data:   Basic Metabolic Panel: Recent Labs  Lab 08/02/18 0643 08/03/18 0537  NA 142  --   K 3.2* 3.9  CL 98  --   CO2 32  --   GLUCOSE 92  --   BUN 36*  --   CREATININE 1.01*  --   CALCIUM 8.9  --     ABG: Recent Labs  Lab 07/30/18 1520  PHART 7.416  PCO2ART 45.9  PO2ART 76.9*  HCO3 28.9*  O2SAT 95.1    Liver Function Tests: Recent Labs  Lab 08/04/18 0359  AST 12*  ALT 10  ALKPHOS 66  BILITOT 0.8  PROT 6.3*  ALBUMIN 2.7*   No results for input(s): LIPASE, AMYLASE in the last 168 hours. No results for input(s): AMMONIA in the last 168 hours.  CBC: Recent Labs  Lab 08/01/18 2219 08/03/18 0537 08/04/18 0359  WBC 11.7* 10.6* 10.7*  NEUTROABS 9.8* 8.7* 8.8*  HGB 11.3* 12.0 11.4*  HCT 37.6 41.1 38.0  MCV 96.2 96.0 95.5  PLT 253 312 290    Cardiac Enzymes: No results for input(s): CKTOTAL, CKMB,  CKMBINDEX, TROPONINI in the last 168 hours.  BNP (last 3 results) Recent Labs    07/31/18 0918  BNP 294.8*    ProBNP (last 3 results) No results for input(s): PROBNP in the last 8760 hours.  Radiological Exams: Vas [place] Lower Extremity Venous (dvt)  Result Date: 08/04/2018  Lower Venous Study Indications: Edema.  Performing Technologist: Toma Deiters RVS  Examination Guidelines: A complete evaluation includes B-mode imaging, spectral Doppler, color Doppler, and power Doppler as needed of all accessible portions of each vessel. Bilateral testing is considered an integral part of a complete examination. Limited examinations for reoccurring indications may be performed as noted.  Right Venous Findings: +---------+---------------+---------+-----------+----------+------------------+          CompressibilityPhasicitySpontaneityPropertiesSummary            +---------+---------------+---------+-----------+----------+------------------+ CFV      Full           Yes      Yes                                     +---------+---------------+---------+-----------+----------+------------------+ SFJ      Full                                                            +---------+---------------+---------+-----------+----------+------------------+  FV Prox  Full           Yes      Yes                                     +---------+---------------+---------+-----------+----------+------------------+ FV Mid   Full                                                            +---------+---------------+---------+-----------+----------+------------------+ FV DistalFull           Yes      Yes                                     +---------+---------------+---------+-----------+----------+------------------+ PFV      Full           Yes      Yes                                     +---------+---------------+---------+-----------+----------+------------------+ POP      Full            Yes      Yes                                     +---------+---------------+---------+-----------+----------+------------------+ PTV      Full                                                            +---------+---------------+---------+-----------+----------+------------------+ PERO                                                  Difficult to image +---------+---------------+---------+-----------+----------+------------------+  Left Venous Findings: +---------+---------------+---------+-----------+----------+------------------+          CompressibilityPhasicitySpontaneityPropertiesSummary            +---------+---------------+---------+-----------+----------+------------------+ CFV      Full           Yes      Yes                                     +---------+---------------+---------+-----------+----------+------------------+ SFJ      Full                                                            +---------+---------------+---------+-----------+----------+------------------+ FV Prox  Full           Yes      Yes                                     +---------+---------------+---------+-----------+----------+------------------+  FV Mid   Full                                                            +---------+---------------+---------+-----------+----------+------------------+ FV DistalFull           Yes      Yes                                     +---------+---------------+---------+-----------+----------+------------------+ PFV      Full           Yes      Yes                                     +---------+---------------+---------+-----------+----------+------------------+ POP      Full           Yes      Yes                                     +---------+---------------+---------+-----------+----------+------------------+ PTV      Full                                                             +---------+---------------+---------+-----------+----------+------------------+ PERO                                                  Difficult to image +---------+---------------+---------+-----------+----------+------------------+    Summary: Right: There is no evidence of deep vein thrombosis in the lower extremity. No cystic structure found in the popliteal fossa. Left: There is no evidence of deep vein thrombosis in the lower extremity. No cystic structure found in the popliteal fossa.  *See table(s) above for measurements and observations. Electronically signed by Lemar LivingsBrandon Cain MD on 08/04/2018 at 5:31:50 PM.    Final     Assessment/Plan Active Problems:   Acute on chronic respiratory failure with hypoxia (HCC)   Severe sepsis with septic shock (HCC)   Chronic congestive heart failure (HCC)   AF (paroxysmal atrial fibrillation) (HCC)   Acute pulmonary embolism (HCC)   1. Acute on chronic respiratory failure with hypoxia patient is doing well trying to titrate her oxygen down she is now down to 40%. 2. Severe sepsis with shock hemodynamically stable we will continue present management 3. Chronic congestive heart failure at baseline continue with supportive care 4. Atrial fibrillation rate is controlled 5. Acute pulmonary embolism on heparin anticoagulation.  Lower extremity Dopplers were concerned no significant DVT was found   I have personally seen and evaluated the patient, evaluated laboratory and imaging results, formulated the assessment and plan and placed orders. The Patient requires high complexity decision making for assessment and support.  Case was discussed on Rounds with the Respiratory Therapy Staff  Saadat A  Humphrey Rolls, MD [facility] Pulmonary Critical Care Medicine Sleep Medicine

## 2018-08-05 DIAGNOSIS — I509 Heart failure, unspecified: Secondary | ICD-10-CM | POA: Diagnosis not present

## 2018-08-05 DIAGNOSIS — I480 Paroxysmal atrial fibrillation: Secondary | ICD-10-CM | POA: Diagnosis not present

## 2018-08-05 DIAGNOSIS — J9621 Acute and chronic respiratory failure with hypoxia: Secondary | ICD-10-CM | POA: Diagnosis not present

## 2018-08-05 DIAGNOSIS — I2609 Other pulmonary embolism with acute cor pulmonale: Secondary | ICD-10-CM | POA: Diagnosis not present

## 2018-08-05 LAB — CBC WITH DIFFERENTIAL/PLATELET
ABS IMMATURE GRANULOCYTES: 0.1 10*3/uL — AB (ref 0.00–0.07)
Basophils Absolute: 0 10*3/uL (ref 0.0–0.1)
Basophils Relative: 0 %
Eosinophils Absolute: 0 10*3/uL (ref 0.0–0.5)
Eosinophils Relative: 0 %
HCT: 38.9 % (ref 36.0–46.0)
Hemoglobin: 11.5 g/dL — ABNORMAL LOW (ref 12.0–15.0)
LYMPHS ABS: 0.5 10*3/uL — AB (ref 0.7–4.0)
Lymphocytes Relative: 5 %
MCH: 28.5 pg (ref 26.0–34.0)
MCHC: 29.6 g/dL — ABNORMAL LOW (ref 30.0–36.0)
MCV: 96.3 fL (ref 80.0–100.0)
MONOS PCT: 7 %
Monocytes Absolute: 0.7 10*3/uL (ref 0.1–1.0)
Myelocytes: 1 %
Neutro Abs: 9.3 10*3/uL — ABNORMAL HIGH (ref 1.7–7.7)
Neutrophils Relative %: 87 %
Platelets: 295 10*3/uL (ref 150–400)
RBC: 4.04 MIL/uL (ref 3.87–5.11)
RDW: 23 % — ABNORMAL HIGH (ref 11.5–15.5)
WBC: 10.7 10*3/uL — ABNORMAL HIGH (ref 4.0–10.5)
nRBC: 0 % (ref 0.0–0.2)
nRBC: 0 /100 WBC

## 2018-08-05 LAB — HEPATIC FUNCTION PANEL
ALT: 12 U/L (ref 0–44)
AST: 11 U/L — ABNORMAL LOW (ref 15–41)
Albumin: 2.7 g/dL — ABNORMAL LOW (ref 3.5–5.0)
Alkaline Phosphatase: 71 U/L (ref 38–126)
Bilirubin, Direct: <0.1 mg/dL (ref 0.0–0.2)
TOTAL PROTEIN: 6.2 g/dL — AB (ref 6.5–8.1)
Total Bilirubin: 0.5 mg/dL (ref 0.3–1.2)

## 2018-08-05 LAB — PROTIME-INR
INR: 0.9
Prothrombin Time: 12.1 seconds (ref 11.4–15.2)

## 2018-08-05 LAB — HEPARIN LEVEL (UNFRACTIONATED)
Heparin Unfractionated: 0.26 IU/mL — ABNORMAL LOW (ref 0.30–0.70)
Heparin Unfractionated: 0.32 IU/mL (ref 0.30–0.70)

## 2018-08-05 NOTE — Progress Notes (Signed)
[facility] [place]   PULMONARY CRITICAL CARE SERVICE  PROGRESS NOTE  Date of Service: 08/05/2018  Kelsey Jensen  ZOX:[id number]  DOB: 04-15-1951   DOA: 06/28/2018  Referring Physician: Carron CurieAli Hijazi, MD  HPI: Kelsey Jensen is a 68 y.o. female seen for follow up of Acute on Chronic Respiratory Failure.  Doing well remains on 15 L try to wean her FiO2 down slowly  Medications: Reviewed on Rounds  Physical Exam:  Vitals: Temperature 98.9 pulse 88 respiratory 18 blood pressure 147/81 saturations 98%  Ventilator Settings on 15 L FiO2 40%  . General: Comfortable at this time . Eyes: Grossly normal lids, irises & conjunctiva . ENT: grossly tongue is normal . Neck: no obvious mass . Cardiovascular: S1 S2 normal no gallop . Respiratory: Scattered rhonchi expansion is equal . Abdomen: soft . Skin: no rash seen on limited exam . Musculoskeletal: not rigid . Psychiatric:unable to assess . Neurologic: no seizure no involuntary movements         Lab Data:   Basic Metabolic Panel: Recent Labs  Lab 08/02/18 0643 08/03/18 0537  NA 142  --   K 3.2* 3.9  CL 98  --   CO2 32  --   GLUCOSE 92  --   BUN 36*  --   CREATININE 1.01*  --   CALCIUM 8.9  --     ABG: Recent Labs  Lab 07/30/18 1520  PHART 7.416  PCO2ART 45.9  PO2ART 76.9*  HCO3 28.9*  O2SAT 95.1    Liver Function Tests: Recent Labs  Lab 08/04/18 0359 08/05/18 0457  AST 12* 11*  ALT 10 12  ALKPHOS 66 71  BILITOT 0.8 0.5  PROT 6.3* 6.2*  ALBUMIN 2.7* 2.7*   No results for input(s): LIPASE, AMYLASE in the last 168 hours. No results for input(s): AMMONIA in the last 168 hours.  CBC: Recent Labs  Lab 08/01/18 2219 08/03/18 0537 08/04/18 0359 08/05/18 0457  WBC 11.7* 10.6* 10.7* 10.7*  NEUTROABS 9.8* 8.7* 8.8* 9.3*  HGB 11.3* 12.0 11.4* 11.5*  HCT 37.6 41.1 38.0 38.9  MCV 96.2 96.0 95.5 96.3  PLT 253 312 290 295    Cardiac Enzymes: No results for  input(s): CKTOTAL, CKMB, CKMBINDEX, TROPONINI in the last 168 hours.  BNP (last 3 results) Recent Labs    07/31/18 0918  BNP 294.8*    ProBNP (last 3 results) No results for input(s): PROBNP in the last 8760 hours.  Radiological Exams: Vas [place] Lower Extremity Venous (dvt)  Result Date: 08/04/2018  Lower Venous Study Indications: Edema.  Performing Technologist: Toma DeitersVirginia Jensen RVS  Examination Guidelines: A complete evaluation includes B-mode imaging, spectral Doppler, color Doppler, and power Doppler as needed of all accessible portions of each vessel. Bilateral testing is considered an integral part of a complete examination. Limited examinations for reoccurring indications may be performed as noted.  Right Venous Findings: +---------+---------------+---------+-----------+----------+------------------+          CompressibilityPhasicitySpontaneityPropertiesSummary            +---------+---------------+---------+-----------+----------+------------------+ CFV      Full           Yes      Yes                                     +---------+---------------+---------+-----------+----------+------------------+ SFJ      Full                                                            +---------+---------------+---------+-----------+----------+------------------+  FV Prox  Full           Yes      Yes                                     +---------+---------------+---------+-----------+----------+------------------+ FV Mid   Full                                                            +---------+---------------+---------+-----------+----------+------------------+ FV DistalFull           Yes      Yes                                     +---------+---------------+---------+-----------+----------+------------------+ PFV      Full           Yes      Yes                                      +---------+---------------+---------+-----------+----------+------------------+ POP      Full           Yes      Yes                                     +---------+---------------+---------+-----------+----------+------------------+ PTV      Full                                                            +---------+---------------+---------+-----------+----------+------------------+ PERO                                                  Difficult to image +---------+---------------+---------+-----------+----------+------------------+  Left Venous Findings: +---------+---------------+---------+-----------+----------+------------------+          CompressibilityPhasicitySpontaneityPropertiesSummary            +---------+---------------+---------+-----------+----------+------------------+ CFV      Full           Yes      Yes                                     +---------+---------------+---------+-----------+----------+------------------+ SFJ      Full                                                            +---------+---------------+---------+-----------+----------+------------------+ FV Prox  Full           Yes      Yes                                     +---------+---------------+---------+-----------+----------+------------------+  FV Mid   Full                                                            +---------+---------------+---------+-----------+----------+------------------+ FV DistalFull           Yes      Yes                                     +---------+---------------+---------+-----------+----------+------------------+ PFV      Full           Yes      Yes                                     +---------+---------------+---------+-----------+----------+------------------+ POP      Full           Yes      Yes                                     +---------+---------------+---------+-----------+----------+------------------+ PTV       Full                                                            +---------+---------------+---------+-----------+----------+------------------+ PERO                                                  Difficult to image +---------+---------------+---------+-----------+----------+------------------+    Summary: Right: There is no evidence of deep vein thrombosis in the lower extremity. No cystic structure found in the popliteal fossa. Left: There is no evidence of deep vein thrombosis in the lower extremity. No cystic structure found in the popliteal fossa.  *See table(s) above for measurements and observations. Electronically signed by Lemar Livings MD on 08/04/2018 at 5:31:50 PM.    Final     Assessment/Plan Active Problems:   Acute on chronic respiratory failure with hypoxia (HCC)   Severe sepsis with septic shock (HCC)   Chronic congestive heart failure (HCC)   AF (paroxysmal atrial fibrillation) (HCC)   Acute pulmonary embolism (HCC)   1. Acute on chronic respiratory failure with hypoxia continue try to wean the high flow nasal cannula as tolerated 2. Severe sepsis resolved 3. Chronic congestive heart failure diuretics as tolerated 4. Acute pulmonary embolism on anticoagulation 5. Atrial fibrillation rate controlled we will continue with supportive care   I have personally seen and evaluated the patient, evaluated laboratory and imaging results, formulated the assessment and plan and placed orders. The Patient requires high complexity decision making for assessment and support.  Case was discussed on Rounds with the Respiratory Therapy Staff  Yevonne Pax, MD [facility] Pulmonary Critical Care Medicine Sleep Medicine

## 2018-08-06 DIAGNOSIS — I509 Heart failure, unspecified: Secondary | ICD-10-CM | POA: Diagnosis not present

## 2018-08-06 DIAGNOSIS — J9621 Acute and chronic respiratory failure with hypoxia: Secondary | ICD-10-CM | POA: Diagnosis not present

## 2018-08-06 DIAGNOSIS — I2609 Other pulmonary embolism with acute cor pulmonale: Secondary | ICD-10-CM | POA: Diagnosis not present

## 2018-08-06 DIAGNOSIS — I480 Paroxysmal atrial fibrillation: Secondary | ICD-10-CM | POA: Diagnosis not present

## 2018-08-06 LAB — CBC WITH DIFFERENTIAL/PLATELET
Abs Immature Granulocytes: 0.35 10*3/uL — ABNORMAL HIGH (ref 0.00–0.07)
Basophils Absolute: 0.1 10*3/uL (ref 0.0–0.1)
Basophils Relative: 1 %
EOS PCT: 1 %
Eosinophils Absolute: 0.1 10*3/uL (ref 0.0–0.5)
HCT: 40.6 % (ref 36.0–46.0)
Hemoglobin: 12.2 g/dL (ref 12.0–15.0)
Immature Granulocytes: 3 %
Lymphocytes Relative: 8 %
Lymphs Abs: 1 10*3/uL (ref 0.7–4.0)
MCH: 29 pg (ref 26.0–34.0)
MCHC: 30 g/dL (ref 30.0–36.0)
MCV: 96.4 fL (ref 80.0–100.0)
MONO ABS: 0.9 10*3/uL (ref 0.1–1.0)
Monocytes Relative: 7 %
Neutro Abs: 10.4 10*3/uL — ABNORMAL HIGH (ref 1.7–7.7)
Neutrophils Relative %: 80 %
Platelets: 295 10*3/uL (ref 150–400)
RBC: 4.21 MIL/uL (ref 3.87–5.11)
RDW: 23.2 % — ABNORMAL HIGH (ref 11.5–15.5)
WBC: 12.9 10*3/uL — ABNORMAL HIGH (ref 4.0–10.5)
nRBC: 0 % (ref 0.0–0.2)

## 2018-08-06 LAB — HEPARIN LEVEL (UNFRACTIONATED): Heparin Unfractionated: 0.28 IU/mL — ABNORMAL LOW (ref 0.30–0.70)

## 2018-08-06 LAB — HEPATIC FUNCTION PANEL
ALT: 12 U/L (ref 0–44)
AST: 11 U/L — ABNORMAL LOW (ref 15–41)
Albumin: 3 g/dL — ABNORMAL LOW (ref 3.5–5.0)
Alkaline Phosphatase: 74 U/L (ref 38–126)
Bilirubin, Direct: 0.1 mg/dL (ref 0.0–0.2)
Indirect Bilirubin: 0.3 mg/dL (ref 0.3–0.9)
Total Bilirubin: 0.4 mg/dL (ref 0.3–1.2)
Total Protein: 6.7 g/dL (ref 6.5–8.1)

## 2018-08-06 LAB — PROTIME-INR
INR: 1.1
Prothrombin Time: 14.1 seconds (ref 11.4–15.2)

## 2018-08-06 LAB — ACID FAST CULTURE WITH REFLEXED SENSITIVITIES (MYCOBACTERIA)

## 2018-08-06 LAB — ACID FAST CULTURE WITH REFLEXED SENSITIVITIES: ACID FAST CULTURE - AFSCU3: NEGATIVE

## 2018-08-06 NOTE — Progress Notes (Signed)
Pulmonary Critical Care Medicine [facility] [place]   PULMONARY CRITICAL CARE SERVICE  PROGRESS NOTE  Date of Service: 08/06/2018  Kelsey Jensen  [id number]  DOB: 02/03/1951   DOA: 06/28/2018  Referring Physician: Carron Curie, MD  HPI: Kelsey Jensen is a 68 y.o. female seen for follow up of Acute on Chronic Respiratory Failure.  Patient is on high flow nasal cannula the FiO2 was decreased down to 35%  Medications: Reviewed on Rounds  Physical Exam:  Vitals: Temperature 97.0 pulse 102 respiratory 19 blood pressure 105/66 saturation 95%  Ventilator Settings on high flow 15 L with an FiO2 of 35%  . General: Comfortable at this time . Eyes: Grossly normal lids, irises & conjunctiva . ENT: grossly tongue is normal . Neck: no obvious mass . Cardiovascular: S1 S2 normal no gallop . Respiratory: Coarse breath sounds with few rhonchi noted . Abdomen: soft . Skin: no rash seen on limited exam . Musculoskeletal: not rigid . Psychiatric:unable to assess . Neurologic: no seizure no involuntary movements         Lab Data:   Basic Metabolic Panel: Recent Labs  Lab 08/02/18 0643 08/03/18 0537  NA 142  --   K 3.2* 3.9  CL 98  --   CO2 32  --   GLUCOSE 92  --   BUN 36*  --   CREATININE 1.01*  --   CALCIUM 8.9  --     ABG: Recent Labs  Lab 07/30/18 1520  PHART 7.416  PCO2ART 45.9  PO2ART 76.9*  HCO3 28.9*  O2SAT 95.1    Liver Function Tests: Recent Labs  Lab 08/04/18 0359 08/05/18 0457 08/06/18 0713  AST 12* 11* 11*  ALT 10 12 12   ALKPHOS 66 71 74  BILITOT 0.8 0.5 0.4  PROT 6.3* 6.2* 6.7  ALBUMIN 2.7* 2.7* 3.0*   No results for input(s): LIPASE, AMYLASE in the last 168 hours. No results for input(s): AMMONIA in the last 168 hours.  CBC: Recent Labs  Lab 08/01/18 2219 08/03/18 0537 08/04/18 0359 08/05/18 0457 08/06/18 0713  WBC 11.7* 10.6* 10.7* 10.7* 12.9*  NEUTROABS 9.8* 8.7* 8.8* 9.3* 10.4*  HGB 11.3* 12.0 11.4* 11.5* 12.2   HCT 37.6 41.1 38.0 38.9 40.6  MCV 96.2 96.0 95.5 96.3 96.4  PLT 253 312 290 295 295    Cardiac Enzymes: No results for input(s): CKTOTAL, CKMB, CKMBINDEX, TROPONINI in the last 168 hours.  BNP (last 3 results) Recent Labs    07/31/18 0918  BNP 294.8*    ProBNP (last 3 results) No results for input(s): PROBNP in the last 8760 hours.  Radiological Exams: Vas [place] Lower Extremity Venous (dvt)  Result Date: 08/04/2018  Lower Venous Study Indications: Edema.  Performing Technologist: Toma Deiters RVS  Examination Guidelines: A complete evaluation includes B-mode imaging, spectral Doppler, color Doppler, and power Doppler as needed of all accessible portions of each vessel. Bilateral testing is considered an integral part of a complete examination. Limited examinations for reoccurring indications may be performed as noted.  Right Venous Findings: +---------+---------------+---------+-----------+----------+------------------+          CompressibilityPhasicitySpontaneityPropertiesSummary            +---------+---------------+---------+-----------+----------+------------------+ CFV      Full           Yes      Yes                                     +---------+---------------+---------+-----------+----------+------------------+  SFJ      Full                                                            +---------+---------------+---------+-----------+----------+------------------+ FV Prox  Full           Yes      Yes                                     +---------+---------------+---------+-----------+----------+------------------+ FV Mid   Full                                                            +---------+---------------+---------+-----------+----------+------------------+ FV DistalFull           Yes      Yes                                     +---------+---------------+---------+-----------+----------+------------------+ PFV      Full            Yes      Yes                                     +---------+---------------+---------+-----------+----------+------------------+ POP      Full           Yes      Yes                                     +---------+---------------+---------+-----------+----------+------------------+ PTV      Full                                                            +---------+---------------+---------+-----------+----------+------------------+ PERO                                                  Difficult to image +---------+---------------+---------+-----------+----------+------------------+  Left Venous Findings: +---------+---------------+---------+-----------+----------+------------------+          CompressibilityPhasicitySpontaneityPropertiesSummary            +---------+---------------+---------+-----------+----------+------------------+ CFV      Full           Yes      Yes                                     +---------+---------------+---------+-----------+----------+------------------+ SFJ      Full                                                            +---------+---------------+---------+-----------+----------+------------------+  FV Prox  Full           Yes      Yes                                     +---------+---------------+---------+-----------+----------+------------------+ FV Mid   Full                                                            +---------+---------------+---------+-----------+----------+------------------+ FV DistalFull           Yes      Yes                                     +---------+---------------+---------+-----------+----------+------------------+ PFV      Full           Yes      Yes                                     +---------+---------------+---------+-----------+----------+------------------+ POP      Full           Yes      Yes                                      +---------+---------------+---------+-----------+----------+------------------+ PTV      Full                                                            +---------+---------------+---------+-----------+----------+------------------+ PERO                                                  Difficult to image +---------+---------------+---------+-----------+----------+------------------+    Summary: Right: There is no evidence of deep vein thrombosis in the lower extremity. No cystic structure found in the popliteal fossa. Left: There is no evidence of deep vein thrombosis in the lower extremity. No cystic structure found in the popliteal fossa.  *See table(s) above for measurements and observations. Electronically signed by Lemar LivingsBrandon Cain MD on 08/04/2018 at 5:31:50 PM.    Final     Assessment/Plan Active Problems:   Acute on chronic respiratory failure with hypoxia (HCC)   Severe sepsis with septic shock (HCC)   Chronic congestive heart failure (HCC)   AF (paroxysmal atrial fibrillation) (HCC)   Acute pulmonary embolism (HCC)   1. Acute on chronic respiratory failure with hypoxia we will continue with try to wean the FiO2 down slowly continue secretion management pulmonary toilet 2. Severe sepsis hemodynamically stable resolved 3. Chronic congestive heart failure improving 4. Acute pulmonary embolism on anticoagulation 5. Atrial fibrillation continue with present management   I have personally seen and evaluated the patient, evaluated laboratory  and imaging results, formulated the assessment and plan and placed orders. The Patient requires high complexity decision making for assessment and support.  Case was discussed on Rounds with the Respiratory Therapy Staff  Allyne Gee, MD [facility] Pulmonary Critical Care Medicine Sleep Medicine

## 2018-08-07 DIAGNOSIS — J9621 Acute and chronic respiratory failure with hypoxia: Secondary | ICD-10-CM | POA: Diagnosis not present

## 2018-08-07 DIAGNOSIS — I509 Heart failure, unspecified: Secondary | ICD-10-CM | POA: Diagnosis not present

## 2018-08-07 DIAGNOSIS — I480 Paroxysmal atrial fibrillation: Secondary | ICD-10-CM | POA: Diagnosis not present

## 2018-08-07 DIAGNOSIS — I2609 Other pulmonary embolism with acute cor pulmonale: Secondary | ICD-10-CM | POA: Diagnosis not present

## 2018-08-07 LAB — CBC WITH DIFFERENTIAL/PLATELET
ABS IMMATURE GRANULOCYTES: 0.41 10*3/uL — AB (ref 0.00–0.07)
Basophils Absolute: 0.1 10*3/uL (ref 0.0–0.1)
Basophils Relative: 1 %
Eosinophils Absolute: 0.1 10*3/uL (ref 0.0–0.5)
Eosinophils Relative: 1 %
HCT: 41.7 % (ref 36.0–46.0)
Hemoglobin: 12.6 g/dL (ref 12.0–15.0)
Immature Granulocytes: 4 %
Lymphocytes Relative: 11 %
Lymphs Abs: 1.1 10*3/uL (ref 0.7–4.0)
MCH: 29.6 pg (ref 26.0–34.0)
MCHC: 30.2 g/dL (ref 30.0–36.0)
MCV: 98.1 fL (ref 80.0–100.0)
MONO ABS: 0.7 10*3/uL (ref 0.1–1.0)
Monocytes Relative: 7 %
NEUTROS ABS: 7.8 10*3/uL — AB (ref 1.7–7.7)
Neutrophils Relative %: 76 %
Platelets: 306 10*3/uL (ref 150–400)
RBC: 4.25 MIL/uL (ref 3.87–5.11)
RDW: 23.8 % — ABNORMAL HIGH (ref 11.5–15.5)
WBC: 10.1 10*3/uL (ref 4.0–10.5)
nRBC: 0 % (ref 0.0–0.2)

## 2018-08-07 LAB — HEPATIC FUNCTION PANEL
ALT: 10 U/L (ref 0–44)
AST: 14 U/L — AB (ref 15–41)
Albumin: 3.1 g/dL — ABNORMAL LOW (ref 3.5–5.0)
Alkaline Phosphatase: 74 U/L (ref 38–126)
Bilirubin, Direct: 0.1 mg/dL (ref 0.0–0.2)
Indirect Bilirubin: 0.4 mg/dL (ref 0.3–0.9)
Total Bilirubin: 0.5 mg/dL (ref 0.3–1.2)
Total Protein: 6.8 g/dL (ref 6.5–8.1)

## 2018-08-07 LAB — PROTIME-INR
INR: 1.2
Prothrombin Time: 15.1 seconds (ref 11.4–15.2)

## 2018-08-07 LAB — OCCULT BLOOD X 1 CARD TO LAB, STOOL: Fecal Occult Bld: NEGATIVE

## 2018-08-07 LAB — HEPARIN LEVEL (UNFRACTIONATED): HEPARIN UNFRACTIONATED: 0.32 [IU]/mL (ref 0.30–0.70)

## 2018-08-07 NOTE — Progress Notes (Addendum)
Pulmonary Critical Care Medicine [facility] [place]   PULMONARY CRITICAL CARE SERVICE  PROGRESS NOTE  Date of Service: 08/07/2018  Kelsey Jensen  [id number]  DOB: 06/16/1951   DOA: 06/28/2018  Referring Physician: Carron Curie, MD  HPI: Kelsey Jensen is a 68 y.o. female seen for follow up of Acute on Chronic Respiratory Failure.  Patient is currently on 6 L of oxygen via nasal cannula and doing well at this time.  No distress noted.  Medications: Reviewed on Rounds  Physical Exam:  Vitals: Pulse 80 respirations 16 BP 114/72 O2 sat 95% temp 97.1  Ventilator Settings not currently on ventilator  . General: Comfortable at this time . Eyes: Grossly normal lids, irises & conjunctiva . ENT: grossly tongue is normal . Neck: no obvious mass . Cardiovascular: S1 S2 normal no gallop . Respiratory: No rales or rhonchi noted . Abdomen: soft . Skin: no rash seen on limited exam . Musculoskeletal: not rigid . Psychiatric:unable to assess . Neurologic: no seizure no involuntary movements         Lab Data:   Basic Metabolic Panel: Recent Labs  Lab 08/02/18 0643 08/03/18 0537  NA 142  --   K 3.2* 3.9  CL 98  --   CO2 32  --   GLUCOSE 92  --   BUN 36*  --   CREATININE 1.01*  --   CALCIUM 8.9  --     ABG: No results for input(s): PHART, PCO2ART, PO2ART, HCO3, O2SAT in the last 168 hours.  Liver Function Tests: Recent Labs  Lab 08/04/18 0359 08/05/18 0457 08/06/18 0713 08/07/18 0513  AST 12* 11* 11* 14*  ALT 10 12 12 10   ALKPHOS 66 71 74 74  BILITOT 0.8 0.5 0.4 0.5  PROT 6.3* 6.2* 6.7 6.8  ALBUMIN 2.7* 2.7* 3.0* 3.1*   No results for input(s): LIPASE, AMYLASE in the last 168 hours. No results for input(s): AMMONIA in the last 168 hours.  CBC: Recent Labs  Lab 08/03/18 0537 08/04/18 0359 08/05/18 0457 08/06/18 0713 08/07/18 0513  WBC 10.6* 10.7* 10.7* 12.9* 10.1  NEUTROABS 8.7* 8.8* 9.3* 10.4* 7.8*  HGB 12.0 11.4* 11.5* 12.2 12.6  HCT  41.1 38.0 38.9 40.6 41.7  MCV 96.0 95.5 96.3 96.4 98.1  PLT 312 290 295 295 306    Cardiac Enzymes: No results for input(s): CKTOTAL, CKMB, CKMBINDEX, TROPONINI in the last 168 hours.  BNP (last 3 results) Recent Labs    07/31/18 0918  BNP 294.8*    ProBNP (last 3 results) No results for input(s): PROBNP in the last 8760 hours.  Radiological Exams: No results found.  Assessment/Plan Active Problems:   Acute on chronic respiratory failure with hypoxia (HCC)   Severe sepsis with septic shock (HCC)   Chronic congestive heart failure (HCC)   AF (paroxysmal atrial fibrillation) (HCC)   Acute pulmonary embolism (HCC)   1. Acute on chronic respiratory failure with hypoxia continue with nasal cannula at 6 L/min.  Continue to manage secretions and pulmonary toilet as well.  Instructed patient on incentive spirometry. 2. Severe sepsis hemodynamically stable 3. Chronic congestive heart failure improving 4. Acute pulmonary embolism on anticoagulation 5. Atrial fibrillation continue present management   I have personally seen and evaluated the patient, evaluated laboratory and imaging results, formulated the assessment and plan and placed orders. The Patient requires high complexity decision making for assessment and support.  Case was discussed on Rounds with the Respiratory Therapy Staff  Elena Cothern A  Humphrey Rolls, MD [facility] Pulmonary Critical Care Medicine Sleep Medicine

## 2018-08-08 DIAGNOSIS — I509 Heart failure, unspecified: Secondary | ICD-10-CM | POA: Diagnosis not present

## 2018-08-08 DIAGNOSIS — J9621 Acute and chronic respiratory failure with hypoxia: Secondary | ICD-10-CM | POA: Diagnosis not present

## 2018-08-08 DIAGNOSIS — I2609 Other pulmonary embolism with acute cor pulmonale: Secondary | ICD-10-CM | POA: Diagnosis not present

## 2018-08-08 DIAGNOSIS — I480 Paroxysmal atrial fibrillation: Secondary | ICD-10-CM | POA: Diagnosis not present

## 2018-08-08 LAB — CBC WITH DIFFERENTIAL/PLATELET
Abs Immature Granulocytes: 0.22 10*3/uL — ABNORMAL HIGH (ref 0.00–0.07)
Basophils Absolute: 0.1 10*3/uL (ref 0.0–0.1)
Basophils Relative: 1 %
Eosinophils Absolute: 0 10*3/uL (ref 0.0–0.5)
Eosinophils Relative: 1 %
HCT: 39.9 % (ref 36.0–46.0)
HEMOGLOBIN: 11.8 g/dL — AB (ref 12.0–15.0)
Immature Granulocytes: 3 %
LYMPHS PCT: 11 %
Lymphs Abs: 0.8 10*3/uL (ref 0.7–4.0)
MCH: 29 pg (ref 26.0–34.0)
MCHC: 29.6 g/dL — AB (ref 30.0–36.0)
MCV: 98 fL (ref 80.0–100.0)
Monocytes Absolute: 0.7 10*3/uL (ref 0.1–1.0)
Monocytes Relative: 8 %
Neutro Abs: 6.1 10*3/uL (ref 1.7–7.7)
Neutrophils Relative %: 76 %
Platelets: 277 10*3/uL (ref 150–400)
RBC: 4.07 MIL/uL (ref 3.87–5.11)
RDW: 23.7 % — ABNORMAL HIGH (ref 11.5–15.5)
WBC: 8 10*3/uL (ref 4.0–10.5)
nRBC: 0 % (ref 0.0–0.2)

## 2018-08-08 LAB — HEPATIC FUNCTION PANEL
ALT: 14 U/L (ref 0–44)
AST: 14 U/L — ABNORMAL LOW (ref 15–41)
Albumin: 2.8 g/dL — ABNORMAL LOW (ref 3.5–5.0)
Alkaline Phosphatase: 68 U/L (ref 38–126)
BILIRUBIN INDIRECT: 0.4 mg/dL (ref 0.3–0.9)
Bilirubin, Direct: 0.1 mg/dL (ref 0.0–0.2)
Total Bilirubin: 0.5 mg/dL (ref 0.3–1.2)
Total Protein: 6.5 g/dL (ref 6.5–8.1)

## 2018-08-08 LAB — HEPARIN LEVEL (UNFRACTIONATED): Heparin Unfractionated: 0.27 IU/mL — ABNORMAL LOW (ref 0.30–0.70)

## 2018-08-08 LAB — PROTIME-INR
INR: 1.52
PROTHROMBIN TIME: 18.1 s — AB (ref 11.4–15.2)

## 2018-08-08 NOTE — Progress Notes (Addendum)
Pulmonary Critical Care Medicine [facility] [place]   PULMONARY CRITICAL CARE SERVICE  PROGRESS NOTE  Date of Service: 08/08/2018  Kelsey Jensen  [id number]  DOB: 15-Jul-1950   DOA: 06/28/2018  Referring Physician: Carron Curie, MD  HPI: Kelsey Jensen is a 68 y.o. female seen for follow up of Acute on Chronic Respiratory Failure.  Patient doing well on 3 L of oxygen via nasal cannula today.  She is weaning nicely at this point.  No acute distress is noted.  Medications: Reviewed on Rounds  Physical Exam:  Vitals: Pulse 84 respirations 20 BP 102/59 O2 sat 99% temp 97.1  Ventilator Settings patient's not currently on ventilator  . General: Comfortable at this time . Eyes: Grossly normal lids, irises & conjunctiva . ENT: grossly tongue is normal . Neck: no obvious mass . Cardiovascular: S1 S2 normal no gallop . Respiratory: No rales or rhonchi noted . Abdomen: soft . Skin: no rash seen on limited exam . Musculoskeletal: not rigid . Psychiatric:unable to assess . Neurologic: no seizure no involuntary movements         Lab Data:   Basic Metabolic Panel: Recent Labs  Lab 08/02/18 0643 08/03/18 0537  NA 142  --   K 3.2* 3.9  CL 98  --   CO2 32  --   GLUCOSE 92  --   BUN 36*  --   CREATININE 1.01*  --   CALCIUM 8.9  --     ABG: No results for input(s): PHART, PCO2ART, PO2ART, HCO3, O2SAT in the last 168 hours.  Liver Function Tests: Recent Labs  Lab 08/04/18 0359 08/05/18 0457 08/06/18 0713 08/07/18 0513 08/08/18 0552  AST 12* 11* 11* 14* 14*  ALT 10 12 12 10 14   ALKPHOS 66 71 74 74 68  BILITOT 0.8 0.5 0.4 0.5 0.5  PROT 6.3* 6.2* 6.7 6.8 6.5  ALBUMIN 2.7* 2.7* 3.0* 3.1* 2.8*   No results for input(s): LIPASE, AMYLASE in the last 168 hours. No results for input(s): AMMONIA in the last 168 hours.  CBC: Recent Labs  Lab 08/04/18 0359 08/05/18 0457 08/06/18 0713 08/07/18 0513 08/08/18 0552  WBC 10.7* 10.7* 12.9* 10.1 8.0   NEUTROABS 8.8* 9.3* 10.4* 7.8* 6.1  HGB 11.4* 11.5* 12.2 12.6 11.8*  HCT 38.0 38.9 40.6 41.7 39.9  MCV 95.5 96.3 96.4 98.1 98.0  PLT 290 295 295 306 277    Cardiac Enzymes: No results for input(s): CKTOTAL, CKMB, CKMBINDEX, TROPONINI in the last 168 hours.  BNP (last 3 results) Recent Labs    07/31/18 0918  BNP 294.8*    ProBNP (last 3 results) No results for input(s): PROBNP in the last 8760 hours.  Radiological Exams: No results found.  Assessment/Plan Active Problems:   Acute on chronic respiratory failure with hypoxia (HCC)   Severe sepsis with septic shock (HCC)   Chronic congestive heart failure (HCC)   AF (paroxysmal atrial fibrillation) (HCC)   Acute pulmonary embolism (HCC)   1. Acute on chronic respiratory failure with hypoxia continue nasal cannula at 3 L/min via nasal cannula.  Continue to wean if possible.  Continue to manage secretions and pulmonary toilet. 2. Severe sepsis hemodynamically stable 3. Chronic congestive heart failure improving 4. Acute pulmonary embolism on anticoagulation 5. Atrial fibrillation continue present management   I have personally seen and evaluated the patient, evaluated laboratory and imaging results, formulated the assessment and plan and placed orders. The Patient requires high complexity decision making for assessment and support.  Case was discussed on Rounds with the Respiratory Therapy Staff  Allyne Gee, MD [facility] Pulmonary Critical Care Medicine Sleep Medicine

## 2018-08-09 ENCOUNTER — Other Ambulatory Visit (HOSPITAL_COMMUNITY): Payer: Medicare Other

## 2018-08-09 DIAGNOSIS — J9621 Acute and chronic respiratory failure with hypoxia: Secondary | ICD-10-CM | POA: Diagnosis not present

## 2018-08-09 DIAGNOSIS — I480 Paroxysmal atrial fibrillation: Secondary | ICD-10-CM | POA: Diagnosis not present

## 2018-08-09 DIAGNOSIS — I509 Heart failure, unspecified: Secondary | ICD-10-CM | POA: Diagnosis not present

## 2018-08-09 DIAGNOSIS — I2609 Other pulmonary embolism with acute cor pulmonale: Secondary | ICD-10-CM | POA: Diagnosis not present

## 2018-08-09 LAB — CBC WITH DIFFERENTIAL/PLATELET
Abs Immature Granulocytes: 0.2 10*3/uL — ABNORMAL HIGH (ref 0.00–0.07)
BASOS ABS: 0.1 10*3/uL (ref 0.0–0.1)
Basophils Relative: 1 %
EOS PCT: 1 %
Eosinophils Absolute: 0.1 10*3/uL (ref 0.0–0.5)
HCT: 41.8 % (ref 36.0–46.0)
Hemoglobin: 12.4 g/dL (ref 12.0–15.0)
Immature Granulocytes: 2 %
Lymphocytes Relative: 11 %
Lymphs Abs: 0.9 10*3/uL (ref 0.7–4.0)
MCH: 29.2 pg (ref 26.0–34.0)
MCHC: 29.7 g/dL — ABNORMAL LOW (ref 30.0–36.0)
MCV: 98.6 fL (ref 80.0–100.0)
Monocytes Absolute: 0.6 10*3/uL (ref 0.1–1.0)
Monocytes Relative: 7 %
Neutro Abs: 6.7 10*3/uL (ref 1.7–7.7)
Neutrophils Relative %: 78 %
Platelets: 271 10*3/uL (ref 150–400)
RBC: 4.24 MIL/uL (ref 3.87–5.11)
RDW: 23.6 % — ABNORMAL HIGH (ref 11.5–15.5)
WBC: 8.5 10*3/uL (ref 4.0–10.5)
nRBC: 0 % (ref 0.0–0.2)

## 2018-08-09 LAB — HEPARIN LEVEL (UNFRACTIONATED): Heparin Unfractionated: 0.26 IU/mL — ABNORMAL LOW (ref 0.30–0.70)

## 2018-08-09 LAB — HEPATIC FUNCTION PANEL
ALT: 17 U/L (ref 0–44)
AST: 16 U/L (ref 15–41)
Albumin: 2.9 g/dL — ABNORMAL LOW (ref 3.5–5.0)
Alkaline Phosphatase: 71 U/L (ref 38–126)
Bilirubin, Direct: <0.1 mg/dL (ref 0.0–0.2)
Total Bilirubin: 0.4 mg/dL (ref 0.3–1.2)
Total Protein: 6.6 g/dL (ref 6.5–8.1)

## 2018-08-09 LAB — PROTIME-INR
INR: 1.89
Prothrombin Time: 21.5 seconds — ABNORMAL HIGH (ref 11.4–15.2)

## 2018-08-09 LAB — BRAIN NATRIURETIC PEPTIDE: B Natriuretic Peptide: 301.9 pg/mL — ABNORMAL HIGH (ref 0.0–100.0)

## 2018-08-09 NOTE — Progress Notes (Signed)
Pulmonary Critical Care Medicine [facility] [place]   PULMONARY CRITICAL CARE SERVICE  PROGRESS NOTE  Date of Service: 08/09/2018  Kelsey Jensen  WPV:[id number]  DOB: [birth year]   DOA: 06/28/2018  Referring Physician: Carron Curie, MD  HPI: Kelsey Jensen is a 68 y.o. female seen for follow up of Acute on Chronic Respiratory Failure.  Patient currently is on 3 L doing well is off the high flow nasal cannula now  Medications: Reviewed on Rounds  Physical Exam:  Vitals: Temperature 97.8 pulse 78 respiratory 21 blood pressure 84/64 will be rechecked clinically she looks fine  Ventilator Settings off the ventilator on 3 L  . General: Comfortable at this time . Eyes: Grossly normal lids, irises & conjunctiva . ENT: grossly tongue is normal . Neck: no obvious mass . Cardiovascular: S1 S2 normal no gallop . Respiratory: Scattered rhonchi expansion is equal . Abdomen: soft . Skin: no rash seen on limited exam . Musculoskeletal: not rigid . Psychiatric:unable to assess . Neurologic: no seizure no involuntary movements         Lab Data:   Basic Metabolic Panel: Recent Labs  Lab 08/03/18 0537  K 3.9    ABG: No results for input(s): PHART, PCO2ART, PO2ART, HCO3, O2SAT in the last 168 hours.  Liver Function Tests: Recent Labs  Lab 08/05/18 0457 08/06/18 0713 08/07/18 0513 08/08/18 0552 08/09/18 0542  AST 11* 11* 14* 14* 16  ALT 12 12 10 14 17   ALKPHOS 71 74 74 68 71  BILITOT 0.5 0.4 0.5 0.5 0.4  PROT 6.2* 6.7 6.8 6.5 6.6  ALBUMIN 2.7* 3.0* 3.1* 2.8* 2.9*   No results for input(s): LIPASE, AMYLASE in the last 168 hours. No results for input(s): AMMONIA in the last 168 hours.  CBC: Recent Labs  Lab 08/05/18 0457 08/06/18 0713 08/07/18 0513 08/08/18 0552 08/09/18 0542  WBC 10.7* 12.9* 10.1 8.0 8.5  NEUTROABS 9.3* 10.4* 7.8* 6.1 6.7  HGB 11.5* 12.2 12.6 11.8* 12.4  HCT 38.9 40.6 41.7 39.9 41.8  MCV 96.3 96.4 98.1 98.0 98.6  PLT 295 295 306  277 271    Cardiac Enzymes: No results for input(s): CKTOTAL, CKMB, CKMBINDEX, TROPONINI in the last 168 hours.  BNP (last 3 results) Recent Labs    07/31/18 0918  BNP 294.8*    ProBNP (last 3 results) No results for input(s): PROBNP in the last 8760 hours.  Radiological Exams: No results found.  Assessment/Plan Active Problems:   Acute on chronic respiratory failure with hypoxia (HCC)   Severe sepsis with septic shock (HCC)   Chronic congestive heart failure (HCC)   AF (paroxysmal atrial fibrillation) (HCC)   Acute pulmonary embolism (HCC)   1. Acute on chronic respiratory failure hypoxia patient has been weaned off the fluid now doing fine on 3 L continue with supportive care 2. Severe sepsis with shock hemodynamically stable 3. Chronic congestive heart failure at baseline 4. Atrial fibrillation rate controlled 5. Acute pulmonary embolism treated on anticoagulation   I have personally seen and evaluated the patient, evaluated laboratory and imaging results, formulated the assessment and plan and placed orders. The Patient requires high complexity decision making for assessment and support.  Case was discussed on Rounds with the Respiratory Therapy Staff  Yevonne Pax, MD [facility] Pulmonary Critical Care Medicine Sleep Medicine

## 2018-08-10 DIAGNOSIS — J9621 Acute and chronic respiratory failure with hypoxia: Secondary | ICD-10-CM | POA: Diagnosis not present

## 2018-08-10 DIAGNOSIS — I2609 Other pulmonary embolism with acute cor pulmonale: Secondary | ICD-10-CM | POA: Diagnosis not present

## 2018-08-10 DIAGNOSIS — I509 Heart failure, unspecified: Secondary | ICD-10-CM | POA: Diagnosis not present

## 2018-08-10 DIAGNOSIS — I480 Paroxysmal atrial fibrillation: Secondary | ICD-10-CM | POA: Diagnosis not present

## 2018-08-10 LAB — HEPATIC FUNCTION PANEL
ALT: 19 U/L (ref 0–44)
AST: 19 U/L (ref 15–41)
Albumin: 2.8 g/dL — ABNORMAL LOW (ref 3.5–5.0)
Alkaline Phosphatase: 70 U/L (ref 38–126)
Bilirubin, Direct: 0.1 mg/dL (ref 0.0–0.2)
Indirect Bilirubin: 0.3 mg/dL (ref 0.3–0.9)
Total Bilirubin: 0.4 mg/dL (ref 0.3–1.2)
Total Protein: 6.6 g/dL (ref 6.5–8.1)

## 2018-08-10 LAB — PROTIME-INR
INR: 2.8 — ABNORMAL HIGH (ref 0.8–1.2)
Prothrombin Time: 28.9 seconds — ABNORMAL HIGH (ref 11.4–15.2)

## 2018-08-10 LAB — CBC WITH DIFFERENTIAL/PLATELET
Abs Immature Granulocytes: 0.12 10*3/uL — ABNORMAL HIGH (ref 0.00–0.07)
BASOS PCT: 1 %
Basophils Absolute: 0 10*3/uL (ref 0.0–0.1)
Eosinophils Absolute: 0 10*3/uL (ref 0.0–0.5)
Eosinophils Relative: 1 %
HCT: 44.7 % (ref 36.0–46.0)
HEMOGLOBIN: 13.6 g/dL (ref 12.0–15.0)
Immature Granulocytes: 2 %
Lymphocytes Relative: 13 %
Lymphs Abs: 0.9 10*3/uL (ref 0.7–4.0)
MCH: 29.7 pg (ref 26.0–34.0)
MCHC: 30.4 g/dL (ref 30.0–36.0)
MCV: 97.6 fL (ref 80.0–100.0)
Monocytes Absolute: 0.6 10*3/uL (ref 0.1–1.0)
Monocytes Relative: 10 %
NEUTROS ABS: 4.9 10*3/uL (ref 1.7–7.7)
NRBC: 0 % (ref 0.0–0.2)
Neutrophils Relative %: 73 %
Platelets: 261 10*3/uL (ref 150–400)
RBC: 4.58 MIL/uL (ref 3.87–5.11)
RDW: 23.8 % — ABNORMAL HIGH (ref 11.5–15.5)
WBC: 6.6 10*3/uL (ref 4.0–10.5)

## 2018-08-10 NOTE — Progress Notes (Signed)
Pulmonary Critical Care Medicine [facility] [place]   PULMONARY CRITICAL CARE SERVICE  PROGRESS NOTE  Date of Service: 08/10/2018  Kelsey Jensen  [id number]  DOB: 1950/08/05   DOA: 06/28/2018  Referring Physician: Carron Curie, MD  HPI: Kelsey Jensen is a 68 y.o. female seen for follow up of Acute on Chronic Respiratory Failure.  Currently is on 3 L nasal cannula patient is comfortable without distress.  Medications: Reviewed on Rounds  Physical Exam:  Vitals: Temperature 97.4 pulse 93 respiratory 17 blood pressure 119/66 saturations 95%  Ventilator Settings off the ventilator  . General: Comfortable at this time . Eyes: Grossly normal lids, irises & conjunctiva . ENT: grossly tongue is normal . Neck: no obvious mass . Cardiovascular: S1 S2 normal no gallop . Respiratory: No rhonchi or rales are noted at this time . Abdomen: soft . Skin: no rash seen on limited exam . Musculoskeletal: not rigid . Psychiatric:unable to assess . Neurologic: no seizure no involuntary movements         Lab Data:   Basic Metabolic Panel: No results for input(s): NA, K, CL, CO2, GLUCOSE, BUN, CREATININE, CALCIUM, MG, PHOS in the last 168 hours.  ABG: No results for input(s): PHART, PCO2ART, PO2ART, HCO3, O2SAT in the last 168 hours.  Liver Function Tests: Recent Labs  Lab 08/06/18 0713 08/07/18 0513 08/08/18 0552 08/09/18 0542 08/10/18 0532  AST 11* 14* 14* 16 19  ALT 12 10 14 17 19   ALKPHOS 74 74 68 71 70  BILITOT 0.4 0.5 0.5 0.4 0.4  PROT 6.7 6.8 6.5 6.6 6.6  ALBUMIN 3.0* 3.1* 2.8* 2.9* 2.8*   No results for input(s): LIPASE, AMYLASE in the last 168 hours. No results for input(s): AMMONIA in the last 168 hours.  CBC: Recent Labs  Lab 08/06/18 0713 08/07/18 0513 08/08/18 0552 08/09/18 0542 08/10/18 0532  WBC 12.9* 10.1 8.0 8.5 6.6  NEUTROABS 10.4* 7.8* 6.1 6.7 4.9  HGB 12.2 12.6 11.8* 12.4 13.6  HCT 40.6 41.7 39.9 41.8 44.7  MCV 96.4 98.1 98.0  98.6 97.6  PLT 295 306 277 271 261    Cardiac Enzymes: No results for input(s): CKTOTAL, CKMB, CKMBINDEX, TROPONINI in the last 168 hours.  BNP (last 3 results) Recent Labs    07/31/18 0918 08/09/18 0542  BNP 294.8* 301.9*    ProBNP (last 3 results) No results for input(s): PROBNP in the last 8760 hours.  Radiological Exams: Dg Chest Port 1 View  Result Date: 08/09/2018 CLINICAL DATA:  Respiratory failure, hypoxia. EXAM: PORTABLE CHEST 1 VIEW COMPARISON:  Radiograph of July 29, 2018. FINDINGS: Stable cardiomegaly. No pneumothorax or pleural effusion is noted. Mild right basilar opacity is noted concerning for pneumonia or atelectasis. Bony thorax is unremarkable. IMPRESSION: Mild right basilar pneumonia or atelectasis. Followup PA and lateral chest X-ray is recommended in 3-4 weeks following trial of antibiotic therapy to ensure resolution and exclude underlying malignancy. Electronically Signed   By: Lupita Raider, M.D.   On: 08/09/2018 12:27    Assessment/Plan Active Problems:   Acute on chronic respiratory failure with hypoxia (HCC)   Severe sepsis with septic shock (HCC)   Chronic congestive heart failure (HCC)   AF (paroxysmal atrial fibrillation) (HCC)   Acute pulmonary embolism (HCC)   1. Acute on chronic respiratory failure with hypoxia doing well with 3 L we will continue with pulmonary toilet supportive care 2. Severe sepsis with shock, dynamically stable 3. Chronic congestive heart failure at baseline 4. Atrial fibrillation  rate controlled 5. Acute pulmonary embolism treated improving    I have personally seen and evaluated the patient, evaluated laboratory and imaging results, formulated the assessment and plan and placed orders. The Patient requires high complexity decision making for assessment and support.  Case was discussed on Rounds with the Respiratory Therapy Staff  Yevonne Pax, MD [facility] Pulmonary Critical Care Medicine Sleep Medicine

## 2018-08-11 ENCOUNTER — Encounter: Payer: Self-pay | Admitting: *Deleted

## 2018-08-11 LAB — HEPATIC FUNCTION PANEL
ALT: 25 U/L (ref 0–44)
AST: 20 U/L (ref 15–41)
Albumin: 3.2 g/dL — ABNORMAL LOW (ref 3.5–5.0)
Alkaline Phosphatase: 78 U/L (ref 38–126)
BILIRUBIN TOTAL: 0.7 mg/dL (ref 0.3–1.2)
Bilirubin, Direct: 0.2 mg/dL (ref 0.0–0.2)
Indirect Bilirubin: 0.5 mg/dL (ref 0.3–0.9)
Total Protein: 7.2 g/dL (ref 6.5–8.1)

## 2018-08-11 LAB — CBC WITH DIFFERENTIAL/PLATELET
Abs Immature Granulocytes: 0.08 10*3/uL — ABNORMAL HIGH (ref 0.00–0.07)
Basophils Absolute: 0 10*3/uL (ref 0.0–0.1)
Basophils Relative: 1 %
Eosinophils Absolute: 0.1 10*3/uL (ref 0.0–0.5)
Eosinophils Relative: 1 %
HEMATOCRIT: 46 % (ref 36.0–46.0)
Hemoglobin: 13.8 g/dL (ref 12.0–15.0)
Immature Granulocytes: 1 %
Lymphocytes Relative: 19 %
Lymphs Abs: 1.2 10*3/uL (ref 0.7–4.0)
MCH: 29 pg (ref 26.0–34.0)
MCHC: 30 g/dL (ref 30.0–36.0)
MCV: 96.6 fL (ref 80.0–100.0)
Monocytes Absolute: 0.6 10*3/uL (ref 0.1–1.0)
Monocytes Relative: 10 %
NEUTROS ABS: 4.2 10*3/uL (ref 1.7–7.7)
Neutrophils Relative %: 68 %
Platelets: 292 10*3/uL (ref 150–400)
RBC: 4.76 MIL/uL (ref 3.87–5.11)
RDW: 23.6 % — ABNORMAL HIGH (ref 11.5–15.5)
WBC: 6.1 10*3/uL (ref 4.0–10.5)
nRBC: 0 % (ref 0.0–0.2)

## 2018-08-11 LAB — PROTIME-INR
INR: 2.6 — ABNORMAL HIGH (ref 0.8–1.2)
Prothrombin Time: 27.4 seconds — ABNORMAL HIGH (ref 11.4–15.2)

## 2018-08-11 NOTE — PMR Pre-admission (Shared)
[employer] PMR Admission Coordinator Pre-Admission Assessment  Patient: Kelsey Jensen is an 68 y.o., female MRN: [id number] DOB: June 16, 1951 Height:   Weight:    Insurance Information HMO:     PPO:      PCP:      IPA:      80/20:      OTHER: no HMO PRIMARY: Medicare a and b      Policy#: [id number]      Subscriber: pt Benefits:  Phone #: passport one online     Name: 08/11/2018 Eff. Date: 04/16/2013     Deduct: $1408      Out of Pocket Max: none      Life Max: none CIR: 100%      SNF: 20 full days Outpatient: 80%     Co-Pay: 20% Home Health: 100%      Co-Pay: 20% DME: 80%     Co-Pay: 20% Providers: pt choice  SECONDARY: [place]      Policy#: [id number]      Subscriber: pt  Patient acute hospital 06/10/18 until 06/17/2018 then to [facility] [place] [facility]. Admitted to [facility] 08/12/2018. Patient has used 62 days of 90 acute hospital days before reaching her lifetime reserve days. Will have 28 days before LTR days  Medicaid Application Date:       Case Manager:  Disability Application Date:       Case Worker:   Emergency Contact Information Contact Information    Name Relation Home Work Mobile   Iola, Turri Son   [phone]   [name] Granddaughter   [phone]      Current Medical History  Patient Admitting Diagnosis: Debility; respiratory failure  History of Present Illness: Kelsey Jensen is a 68 year old right-handed female with history of acute on chronic respiratory failure maintained on chronic 3 L of oxygen at home as well as history of hypertension, diastolic congestive heart failure, atrial fibrillation maintained on metoprolol.chronic aplastic anemia requiring multiple transfusions. Patient is followed by [facility] family practice in [place], [place] fax [phone]. Patient initially presented to [facility] 06/09/2018 with increasing shortness of breath requiring intubation as well as hemoglobin 4.8. She was  subsequently transfused and stabilized. Patient transferred to [facility] on 06/17/2018 remaining intubated and later underwent tracheostomy 06/28/2018 per Dr. Melony Overly. Patient is since been de cannulated remaining on chronic oxygen as prior to admission. There were initial concerns of possible need for PEG tube for nutritional support but due to anatomy noted moderate size hiatal hernia  And gastric positioning behind the left lobe of the liver and transverse colon procedure could not be completed. Initially with nasogastric tube feeds her diet has since been advanced to a regular consistency. On 08/01/2018 increasing shortness of breath CT the chest showed bilateral lobar pulmonary emboli right greater than left and evidence of right heart strain. Patient was cleared to begin heparin transitioned to Coumadin. Cardiology services follow-up for noted heart strain Dr. Doylene Canard and continue to monitor. Patient did have a history of atrial fibrillation and was advised to continue metoprolol with cardiac rate controlled. Close monitoring of hemoglobin with noted history of chronic aplastic anemia last hemoglobin 13.8 as well as INR 2.7.   Patient's medical record from [facility] has been reviewed by the rehabilitation admission coordinator and physician.  Past Medical History  Past Medical History:  Diagnosis Date  . Acute pulmonary embolism (HCC)   Aplastic anemia HTN Diastolic congestive Heart Failure Atrial fibrillation Chronic respiratory  failure  Family History   family history is not on file.  Prior Rehab/Hospitalizations Has the patient had major surgery during 100 days prior to admission? No    Current Medications Humalog Amlodipine Aspirin Atorvastatin calcium Clonazepam Famotidine Ferrous sulfate Furosemide Warfarin Meclizine Metoprolol Pantoprazole Prednisone Pro stat  Patients Current Diet:   Diet Order    None    Dysphagia 3  diet with thin liquids Poor dentition  Precautions / Restrictions Precautions Precautions: Fall Precautions/Special Needs: Swallowing Restrictions Weight Bearing Restrictions: No   Has the patient had 2 or more falls or a fall with injury in the past year?No  Prior Activity Level Limited Community (1-2x/wk): Independent; used RW or cane prn; drove; cooked, etc. patient states rare driving  Prior Functional Level Do you want Prior Function Level of Independence: Independent with assistive device(s) from other? Self Care: Did the patient need help bathing, dressing, using the toilet or eating?  Independent  Indoor Mobility: Did the patient need assistance with walking from room to room (with or without device)? Independent  Stairs: Did the patient need assistance with internal or external stairs (with or without device)? Independent  Functional Cognition: Did the patient need help planning regular tasks such as shopping or remembering to take medications? Independent  Home Assistive Devices / Equipment Home Assistive Devices/Equipment: Oxygen, Walker (specify type), Cane (specify quad or straight)  Prior Device Use: Indicate devices/aids used by the patient prior to current illness, exacerbation or injury? Walker  Prior Functional Level  Mod I with RW or cane as she felt she needed. O2 at 3 liters nasal cannula at home; drove   Prior Functional Level Current Functional Level  Bed Mobility  Independent   min assist  Transfers Independent Mod assist to power up  Mobility - Walk/Wheelchair Independent Min assist pivotal steps to chair  Mobility - Ambulation/Gait Independent    Upper Body Dressing Independent Min assist  Lower Body Dressing Independent Mod to max assist  Grooming Independent Min assist  Eating/Drinking Independent Set up   Toilet Transfer Independent Mod assist  Bladder Continence continent External catheter  Bowel Management  continent continent LBM 2/27   Stair Climbing Independent Not attempted  Communication independent independent  Memory intact intact  Cooking/Meal Prep  Independent     Housework  independent   Money Management  Independent   Driving  rare     Special needs/care consideration BiPAP/CPAP n/a CPM n/a Continuous Drip IV n/a Dialysis n/a Life Vest n/a Oxygen 3 liters nasal l cannula as at baseline pta Special Bed )2 at 3 liters nasal cannula at baseline pta Trach Size de cannulated; old trach site Wound Vac n/a Skin intact; som ecchymosis to BUE                              Bowel mgmt: continent LBM 2/27     Bladder mgmt:external catheter Diabetic mgmt n/a  Previous Home Environment Living Arrangements: Children(lives with adult son, Izell Stanton)  Lives With: Son Available Help at Discharge: Family, Available 24 hours/day(Willie and Kalayna) Type of Home: Mobile home Home Layout: One level Home Access: Stairs to enter Entrance Stairs-Rails: Left Entrance Stairs-Number of Steps: 5 Bathroom Shower/Tub: Walk-in shower(with portable tub set with back) Bathroom Toilet: Standard(with BSC over toilet) Bathroom Accessibility: Yes How Accessible: Accessible via walker [place]: No  Discharge Living Setting Plans for Discharge Living Setting: Patient's home, Lives with (comment)(son, [place]) Type of Home  at Discharge: Mobile home Discharge Home Layout: One level Discharge Home Access: Stairs to enter Entrance Stairs-Rails: Left Entrance Stairs-Number of Steps: 5 Discharge Bathroom Shower/Tub: Walk-in shower(with portable tib seat with back) Discharge Bathroom Toilet: Standard(BSC over toilet) Discharge Bathroom Accessibility: Yes How Accessible: Accessible via walker Does the patient have any problems obtaining your medications?: No  Social/Family/Support Systems Patient Roles: Parent Contact Information: grand daughter, Elmyra Ricks Anticipated Caregiver: son and grand daughter Anticipated  Caregiver's Contact Information: Elmyra Ricks cell [phone] Ability/Limitations of Caregiver: Elmyra Ricks in school at 3 pm; will be there 7 a until 3 pm; Izell [place] otherwise Caregiver Availability: 24/7 Discharge Plan Discussed with Primary Caregiver: Yes Is Caregiver In Agreement with Plan?: Yes Does Caregiver/Family have Issues with Lodging/Transportation while Pt is in Rehab?: Yes  Goals/Additional Needs Patient/Family Goal for Rehab: supervision to min assist PT and OT Expected length of stay: ELOS 10 to 14 days Pt/Family Agrees to Admission and willing to participate: Yes Program Orientation Provided & Reviewed with Pt/Caregiver Including Roles  & Responsibilities: Yes  Patient Condition: I have reviewed medical records from [facility] , spoken with CM patient and [place] daughter. I met with patient at the bedside for inpatient rehabilitation assessment.  Patient will benefit from ongoing PT and OT can actively participate in 3 hours of therapy a day 5 days of the week, and can make measurable gains during the admission.  Patient will also benefit from the coordinated team approach during an Inpatient Acute Rehabilitation admission.  The patient will receive intensive therapy as well as Rehabilitation physician, nursing, social worker, and care management interventions.  Due to bowel management, bladder management, safety, skin/wound care, disease management, medical administration, pain management, patient education the patient requires 24 hour a day rehabilitation nursing.  The patient is currently *** with mobility and basic ADLs.  Discharge setting and therapy post discharge at  home with home health is anticipated.  Patient has agreed to participate in the Acute Inpatient Rehabilitation Program and will admit today.  Preadmission Screen Completed By:  Cleatrice Burke RN MSN, 08/12/2018 10:41 AM ______________________________________________________________________    Discussed status with Dr. Letta Pate  on  08/12/2018 at  1051 and received telephone approval for admission today.  Admission Coordinator:  Cleatrice Burke RN MSN, time  1051 Date  08/12/2018   Assessment/Plan: Diagnosis: 1. Does the need for close, 24 hr/day  Medical supervision in concert with the patient's rehab needs make it unreasonable for this patient to be served in a less intensive setting? {yes_no_potentially:3041433} 2. Co-Morbidities requiring supervision/potential complications: *** 3. Due to {due YS:0630160}, does the patient require 24 hr/day rehab nursing? {yes_no_potentially:3041433} 4. Does the patient require coordinated care of a physician, rehab nurse, {coordinated FUXN:2355732} to address physical and functional deficits in the context of the above medical diagnosis(es)? {yes_no_potentially:3041433} Addressing deficits in the following areas: {deficits:3041436} 5. Can the patient actively participate in an intensive therapy program of at least 3 hrs of therapy 5 days a week? {yes_no_potentially:3041433} 6. The potential for patient to make measurable gains while on inpatient rehab is {potential:3041437} 7. Anticipated functional outcomes upon discharge from inpatients are: {functional outcomes:304600100} PT, {functional outcomes:304600100} OT, {functional outcomes:304600100} SLP 8. Estimated rehab length of stay to reach the above functional goals is: *** 9. Anticipated D/C setting: {anticipated dc setting:21604} 10. Anticipated post D/C treatments: {post dc treatment:21605} 11. Overall Rehab/Functional Prognosis: {potential:3041437}  ***  Cleatrice Burke RN MSN Admissions Coordinator 08/12/2018

## 2018-08-11 NOTE — Progress Notes (Addendum)
Pulmonary Critical Care Medicine [facility] [place]   PULMONARY CRITICAL CARE SERVICE  PROGRESS NOTE  Date of Service: 08/11/2018  Kelsey Jensen  XAJ:[id number]  DOB: [birth year]   DOA: 06/28/2018  Referring Physician: Carron Curie, MD  HPI: Kelsey Jensen is a 68 y.o. female seen for follow up of Acute on Chronic Respiratory Failure.  Patient continues to do well on 3 L of oxygen via nasal cannula.  No acute distress noted at this time.  Medications: Reviewed on Rounds  Physical Exam:  Vitals: Pulse 91 respirations 16 BP 91/88 O2 sat 95% temp 98.0  Ventilator Settings not currently on ventilator  . General: Comfortable at this time . Eyes: Grossly normal lids, irises & conjunctiva . ENT: grossly tongue is normal . Neck: no obvious mass . Cardiovascular: S1 S2 normal no gallop . Respiratory: No rales or rhonchi noted . Abdomen: soft . Skin: no rash seen on limited exam . Musculoskeletal: not rigid . Psychiatric:unable to assess . Neurologic: no seizure no involuntary movements         Lab Data:   Basic Metabolic Panel: No results for input(s): NA, K, CL, CO2, GLUCOSE, BUN, CREATININE, CALCIUM, MG, PHOS in the last 168 hours.  ABG: No results for input(s): PHART, PCO2ART, PO2ART, HCO3, O2SAT in the last 168 hours.  Liver Function Tests: Recent Labs  Lab 08/07/18 0513 08/08/18 0552 08/09/18 0542 08/10/18 0532 08/11/18 0546  AST 14* 14* 16 19 20   ALT 10 14 17 19 25   ALKPHOS 74 68 71 70 78  BILITOT 0.5 0.5 0.4 0.4 0.7  PROT 6.8 6.5 6.6 6.6 7.2  ALBUMIN 3.1* 2.8* 2.9* 2.8* 3.2*   No results for input(s): LIPASE, AMYLASE in the last 168 hours. No results for input(s): AMMONIA in the last 168 hours.  CBC: Recent Labs  Lab 08/07/18 0513 08/08/18 0552 08/09/18 0542 08/10/18 0532 08/11/18 0546  WBC 10.1 8.0 8.5 6.6 6.1  NEUTROABS 7.8* 6.1 6.7 4.9 4.2  HGB 12.6 11.8* 12.4 13.6 13.8  HCT 41.7 39.9 41.8 44.7 46.0  MCV 98.1 98.0 98.6 97.6 96.6   PLT 306 277 271 261 292    Cardiac Enzymes: No results for input(s): CKTOTAL, CKMB, CKMBINDEX, TROPONINI in the last 168 hours.  BNP (last 3 results) Recent Labs    07/31/18 0918 08/09/18 0542  BNP 294.8* 301.9*    ProBNP (last 3 results) No results for input(s): PROBNP in the last 8760 hours.  Radiological Exams: No results found.  Assessment/Plan Active Problems:   Acute on chronic respiratory failure with hypoxia (HCC)   Severe sepsis with septic shock (HCC)   Chronic congestive heart failure (HCC)   AF (paroxysmal atrial fibrillation) (HCC)   Acute pulmonary embolism (HCC)   1. Acute on chronic respiratory failure with hypoxia doing well on 3 L continue with pulmonary toilet and supportive care 2. Severe sepsis with shock, hemodynamically stable 3. Chronic congestive heart failure at baseline 4. Atrial fibrillation rate controlled 5. Acute pulmonary embolism treated improving   I have personally seen and evaluated the patient, evaluated laboratory and imaging results, formulated the assessment and plan and placed orders. The Patient requires high complexity decision making for assessment and support.  Case was discussed on Rounds with the Respiratory Therapy Staff  Yevonne Pax, MD [facility] Pulmonary Critical Care Medicine Sleep Medicine

## 2018-08-12 ENCOUNTER — Other Ambulatory Visit: Payer: Self-pay

## 2018-08-12 ENCOUNTER — Encounter (HOSPITAL_COMMUNITY): Payer: Self-pay | Admitting: *Deleted

## 2018-08-12 ENCOUNTER — Inpatient Hospital Stay (HOSPITAL_COMMUNITY)
Admission: RE | Admit: 2018-08-12 | Discharge: 2018-08-25 | DRG: 945 | Disposition: A | Discharge status: Home Health | Payer: Medicare Other | Source: Other Acute Inpatient Hospital | Attending: Physical Medicine & Rehabilitation | Admitting: Physical Medicine & Rehabilitation

## 2018-08-12 DIAGNOSIS — T380X5A Adverse effect of glucocorticoids and synthetic analogues, initial encounter: Secondary | ICD-10-CM | POA: Diagnosis not present

## 2018-08-12 DIAGNOSIS — J9620 Acute and chronic respiratory failure, unspecified whether with hypoxia or hypercapnia: Secondary | ICD-10-CM | POA: Diagnosis present

## 2018-08-12 DIAGNOSIS — I4891 Unspecified atrial fibrillation: Secondary | ICD-10-CM | POA: Diagnosis not present

## 2018-08-12 DIAGNOSIS — M25562 Pain in left knee: Secondary | ICD-10-CM | POA: Diagnosis not present

## 2018-08-12 DIAGNOSIS — Z930 Tracheostomy status: Secondary | ICD-10-CM | POA: Diagnosis not present

## 2018-08-12 DIAGNOSIS — I10 Essential (primary) hypertension: Secondary | ICD-10-CM

## 2018-08-12 DIAGNOSIS — D619 Aplastic anemia, unspecified: Secondary | ICD-10-CM | POA: Diagnosis present

## 2018-08-12 DIAGNOSIS — I2699 Other pulmonary embolism without acute cor pulmonale: Secondary | ICD-10-CM

## 2018-08-12 DIAGNOSIS — I110 Hypertensive heart disease with heart failure: Secondary | ICD-10-CM | POA: Diagnosis present

## 2018-08-12 DIAGNOSIS — N179 Acute kidney failure, unspecified: Secondary | ICD-10-CM

## 2018-08-12 DIAGNOSIS — R739 Hyperglycemia, unspecified: Secondary | ICD-10-CM | POA: Diagnosis not present

## 2018-08-12 DIAGNOSIS — I5032 Chronic diastolic (congestive) heart failure: Secondary | ICD-10-CM | POA: Diagnosis not present

## 2018-08-12 DIAGNOSIS — R5381 Other malaise: Principal | ICD-10-CM | POA: Diagnosis present

## 2018-08-12 DIAGNOSIS — H938X3 Other specified disorders of ear, bilateral: Secondary | ICD-10-CM

## 2018-08-12 DIAGNOSIS — J9611 Chronic respiratory failure with hypoxia: Secondary | ICD-10-CM | POA: Diagnosis not present

## 2018-08-12 DIAGNOSIS — Z9981 Dependence on supplemental oxygen: Secondary | ICD-10-CM

## 2018-08-12 DIAGNOSIS — K5900 Constipation, unspecified: Secondary | ICD-10-CM | POA: Diagnosis present

## 2018-08-12 DIAGNOSIS — I5030 Unspecified diastolic (congestive) heart failure: Secondary | ICD-10-CM | POA: Diagnosis present

## 2018-08-12 HISTORY — DX: Essential (primary) hypertension: I10

## 2018-08-12 HISTORY — DX: Unspecified osteoarthritis, unspecified site: M19.90

## 2018-08-12 HISTORY — DX: Chronic obstructive pulmonary disease, unspecified: J44.9

## 2018-08-12 HISTORY — DX: Anemia, unspecified: D64.9

## 2018-08-12 HISTORY — DX: Heart failure, unspecified: I50.9

## 2018-08-12 HISTORY — DX: Gastro-esophageal reflux disease without esophagitis: K21.9

## 2018-08-12 LAB — PROTIME-INR
INR: 2.7 — ABNORMAL HIGH (ref 0.8–1.2)
Prothrombin Time: 28.2 seconds — ABNORMAL HIGH (ref 11.4–15.2)

## 2018-08-12 LAB — GLUCOSE, CAPILLARY
Glucose-Capillary: 225 mg/dL — ABNORMAL HIGH (ref 70–99)
Glucose-Capillary: 141 mg/dL — ABNORMAL HIGH (ref 70–99)

## 2018-08-12 LAB — POTASSIUM: Potassium: 4.6 mmol/L (ref 3.5–5.1)

## 2018-08-12 LAB — MRSA PCR SCREENING: MRSA by PCR: NEGATIVE

## 2018-08-12 MED ORDER — ORAL CARE MOUTH RINSE
15.0000 mL | Freq: Two times a day (BID) | OROMUCOSAL | Status: DC
  Administered 2018-08-12 – 2018-08-22 (×15): 15 mL via OROMUCOSAL

## 2018-08-12 MED ORDER — SORBITOL 70 % SOLN: 30.0000 mL | Freq: Every day | Status: DC | PRN

## 2018-08-12 MED ORDER — FUROSEMIDE 40 MG PO TABS
40.0000 mg | ORAL_TABLET | Freq: Every day | ORAL | Status: DC
  Administered 2018-08-13 – 2018-08-25 (×13): 40 mg via ORAL
  Filled 2018-08-12 (×13): qty 1

## 2018-08-12 MED ORDER — CLONAZEPAM 0.5 MG PO TABS
0.5000 mg | ORAL_TABLET | Freq: Three times a day (TID) | ORAL | Status: DC
  Administered 2018-08-12 – 2018-08-25 (×38): 0.5 mg via ORAL
  Filled 2018-08-12 (×38): qty 1

## 2018-08-12 MED ORDER — CHLORHEXIDINE GLUCONATE 0.12 % MT SOLN
15.0000 mL | Freq: Two times a day (BID) | OROMUCOSAL | Status: DC
  Administered 2018-08-12 – 2018-08-25 (×26): 15 mL via OROMUCOSAL
  Filled 2018-08-12 (×26): qty 15

## 2018-08-12 MED ORDER — PREDNISONE 20 MG PO TABS
20.0000 mg | ORAL_TABLET | Freq: Every day | ORAL | Status: DC
  Administered 2018-08-13 – 2018-08-14 (×2): 20 mg via ORAL
  Filled 2018-08-12 (×3): qty 1

## 2018-08-12 MED ORDER — ASPIRIN 81 MG PO CHEW
81.0000 mg | CHEWABLE_TABLET | Freq: Every day | ORAL | Status: DC
  Administered 2018-08-13 – 2018-08-25 (×13): 81 mg via ORAL
  Filled 2018-08-12 (×13): qty 1

## 2018-08-12 MED ORDER — MECLIZINE HCL 25 MG PO TABS
25.0000 mg | ORAL_TABLET | Freq: Three times a day (TID) | ORAL | Status: DC
  Administered 2018-08-12 – 2018-08-21 (×28): 25 mg via ORAL
  Filled 2018-08-12 (×30): qty 1

## 2018-08-12 MED ORDER — METOPROLOL TARTRATE 50 MG PO TABS
75.0000 mg | ORAL_TABLET | Freq: Three times a day (TID) | ORAL | Status: DC
  Administered 2018-08-12 – 2018-08-25 (×25): 75 mg via ORAL
  Filled 2018-08-12 (×35): qty 1

## 2018-08-12 MED ORDER — AMLODIPINE BESYLATE 5 MG PO TABS
5.0000 mg | ORAL_TABLET | Freq: Every day | ORAL | Status: DC
  Administered 2018-08-13 – 2018-08-14 (×2): 5 mg via ORAL
  Filled 2018-08-12 (×2): qty 1

## 2018-08-12 MED ORDER — PANTOPRAZOLE SODIUM 40 MG PO TBEC
40.0000 mg | DELAYED_RELEASE_TABLET | Freq: Every day | ORAL | Status: DC
  Administered 2018-08-13 – 2018-08-25 (×13): 40 mg via ORAL
  Filled 2018-08-12 (×13): qty 1

## 2018-08-12 MED ORDER — FAMOTIDINE 20 MG PO TABS
20.0000 mg | ORAL_TABLET | Freq: Two times a day (BID) | ORAL | Status: DC
  Administered 2018-08-12 – 2018-08-25 (×26): 20 mg via ORAL
  Filled 2018-08-12 (×26): qty 1

## 2018-08-12 MED ORDER — WARFARIN - PHARMACIST DOSING INPATIENT
Freq: Every day | Status: DC
  Administered 2018-08-13: 17:00:00

## 2018-08-12 MED ORDER — DICLOFENAC SODIUM 1 % TD GEL
2.0000 g | Freq: Four times a day (QID) | TRANSDERMAL | Status: DC
  Administered 2018-08-12 – 2018-08-25 (×24): 2 g via TOPICAL
  Filled 2018-08-12: qty 100

## 2018-08-12 MED ORDER — FERROUS SULFATE 325 (65 FE) MG PO TABS
325.0000 mg | ORAL_TABLET | Freq: Two times a day (BID) | ORAL | Status: DC
  Administered 2018-08-12 – 2018-08-25 (×26): 325 mg via ORAL
  Filled 2018-08-12 (×26): qty 1

## 2018-08-12 MED ORDER — ATORVASTATIN CALCIUM 40 MG PO TABS
40.0000 mg | ORAL_TABLET | Freq: Every day | ORAL | Status: DC
  Administered 2018-08-12 – 2018-08-24 (×13): 40 mg via ORAL
  Filled 2018-08-12 (×13): qty 1

## 2018-08-12 MED ORDER — WARFARIN SODIUM 5 MG PO TABS
5.0000 mg | ORAL_TABLET | Freq: Once | ORAL | Status: AC
  Administered 2018-08-12: 5 mg via ORAL
  Filled 2018-08-12: qty 1

## 2018-08-12 NOTE — Progress Notes (Signed)
Patient transferred from [facility] during day shift.During this shift assessment patient c/o of dried up blood at  IV site and requested IV removal. Order place for IV team to assess. IV assessed and removed per patient request by Clinical research associate. Patient tolerated removal without complication. Upon removal IV site clean dry and intact. Will continue to monitor.

## 2018-08-12 NOTE — Progress Notes (Signed)
Patient ID: Kelsey Jensen, female   DOB: October 09, 1950, 68 y.o.   MRN: [id number] Admit to unit via bed. Transferred to bed via stedy. Bil LE weakness. oreinted to unit, plan of care and medications routine. States an understanding of information reviewed. Pamelia Hoit

## 2018-08-12 NOTE — H&P (Signed)
Physical Medicine and Rehabilitation Admission H&P       HPI: Kelsey Jensen is a 68 year old right-handed female with history of acute on chronic respiratory failure maintained on chronic 3 L of oxygen at home as well as history of hypertension, diastolic congestive heart failure, atrial fibrillation maintained on metoprolol.chronic aplastic anemia requiring multiple transfusions. Patient is followed by [facility] family practice in [place], [place] [phone] fax #[phone] Per report patient lives with son in [place]. One level home 3 steps to entry. She used a rolling walker prior to admission. She does not drive but was able to perform her own ADLs. Family does assist as needed. Patient initially presented to [facility] 06/09/2018 with increasing shortness of breath requiring intubation as well as hemoglobin 4.8. She was subsequently transfused and stabilized. Patient transferred to [facility] on 06/17/2018 for prolonged intubation  and later underwent tracheostomy 06/28/2018 per Dr. Dillard Cannon. Patient is since been decannulated remaining on chronic oxygen as prior to admission. There were initial concerns of possible need for PEG tube for nutritional support but due to anatomy noted moderate size hiatal hernia  And gastric positioning behind the left lobe of the liver and transverse colon procedure could not be completed. Initially with nasogastric tube feeds her diet has since been advanced to a regular consistency. On 08/01/2018 increasing shortness of breath CT the chest showed bilateral lobar pulmonary emboli right greater than left and evidence of right heart strain. Patient was cleared to begin heparin transitioned to Coumadin. Cardiology services follow-up for noted heart strain Dr. Algie Coffer and continue to monitor. Patient did have a history of atrial fibrillation and was advised to continue metoprolol with cardiac rate controlled. Close  monitoring of hemoglobin with noted history of chronic aplastic anemia latus hemoglobin 13.8 as well as INR 2.7. Therapy evaluations have been ongoing patient requiring minimum moderate assist for mobility with noted deconditioning. Patient was admitted for a comprehensive rehabilitation program.   Review of Systems  Constitutional: Positive for malaise/fatigue. Negative for chills and fever.  HENT: Negative for hearing loss.   Eyes: Negative for blurred vision and double vision.  Respiratory: Positive for cough and shortness of breath.   Cardiovascular: Positive for leg swelling. Negative for chest pain.  Gastrointestinal: Positive for constipation. Negative for nausea and vomiting.  Genitourinary: Negative for dysuria, flank pain and hematuria.  Musculoskeletal: Positive for joint pain and myalgias.  Skin: Negative for rash.  Neurological: Positive for dizziness.  All other systems reviewed and are negative.       Past Medical History:  Diagnosis Date  . Acute pulmonary embolism (HCC)       The histories are not reviewed yet. Please review them in the "History" navigator section and refresh this SmartLink. No family history on file. Social History:  has no history on file for tobacco, alcohol, and drug. Allergies: Allergies not on file No medications prior to admission.      Drug Regimen Review Drug regimen was reviewed and remains appropriate with no significant issues identified   Home:  Patient lives with son impact her [place]. One level home 3 steps to entry.   Functional History:  patient first minimum moderate assistance for functional mobility as well as ADLs with noted limited endurance   Functional Status:  Mobility:  min mod assist to take 13 steps with a rolling walker   ADL:  min mod assist ADLs   Cognition:  [facility]   Physical Exam: Blood pressure 110/70.  Pulse 70 respirations 18 temperature 98.6 Physical Exam  Vitals reviewed. HENT:  Head:  Normocephalic.  Eyes: EOM are normal.  Neck: Normal range of motion. Neck supple. No thyromegaly present.  Cardiovascular:  Cardiac rate controlled  Respiratory: Effort normal and breath sounds normal.  GI: Soft. Bowel sounds are normal. She exhibits no distension.  Neurological:  Patient is alert and pleasant. Follows full commands.  Skin:  Trach site healing   Neuro:  Eyes without evidence of nystagmus  Tone is normal without evidence of spasticity Cerebellar exam shows no evidence of ataxia on finger nose finger or heel to shin testing, there is mild tremor bilaterally No evidence of trunkal ataxia  Motor strength is 4/5 in bilateral deltoid, biceps, triceps, finger flexors and extensors, wrist flexors and extensors,3-/5  hip flexors, knee flexors and extensors, ankle dorsiflexors, plantar flexors, invertors and evertors, toe flexors and extensors  Sensory exam is normal to  light touch in the upper and lower limbs   MSK  Without joint swelling, no pain with ROM     Lab Results Last 48 Hours        Results for orders placed or performed during the hospital encounter of 06/28/18 (from the past 48 hour(s))  CBC with Differential/Platelet     Status: Abnormal    Collection Time: 08/11/18  5:46 AM  Result Value Ref Range    WBC 6.1 4.0 - 10.5 K/uL    RBC 4.76 3.87 - 5.11 MIL/uL    Hemoglobin 13.8 12.0 - 15.0 g/dL    HCT 51.7 61.6 - 07.3 %    MCV 96.6 80.0 - 100.0 fL    MCH 29.0 26.0 - 34.0 pg    MCHC 30.0 30.0 - 36.0 g/dL    RDW 71.0 (H) 62.6 - 15.5 %    Platelets 292 150 - 400 K/uL    nRBC 0.0 0.0 - 0.2 %    Neutrophils Relative % 68 %    Neutro Abs 4.2 1.7 - 7.7 K/uL    Lymphocytes Relative 19 %    Lymphs Abs 1.2 0.7 - 4.0 K/uL    Monocytes Relative 10 %    Monocytes Absolute 0.6 0.1 - 1.0 K/uL    Eosinophils Relative 1 %    Eosinophils Absolute 0.1 0.0 - 0.5 K/uL    Basophils Relative 1 %    Basophils Absolute 0.0 0.0 - 0.1 K/uL    Immature Granulocytes 1 %     Abs Immature Granulocytes 0.08 (H) 0.00 - 0.07 K/uL      Comment: Performed at [facility], [address], [place], [place] [address]  Hepatic function panel     Status: Abnormal    Collection Time: 08/11/18  5:46 AM  Result Value Ref Range    Total Protein 7.2 6.5 - 8.1 g/dL    Albumin 3.2 (L) 3.5 - 5.0 g/dL    AST 20 15 - 41 U/L    ALT 25 0 - 44 U/L    Alkaline Phosphatase 78 38 - 126 U/L    Total Bilirubin 0.7 0.3 - 1.2 mg/dL    Bilirubin, Direct 0.2 0.0 - 0.2 mg/dL    Indirect Bilirubin 0.5 0.3 - 0.9 mg/dL      Comment: Performed at [facility], [address] [place], [place] [address]  Protime-INR     Status: Abnormal    Collection Time: 08/11/18  5:46 AM  Result Value Ref Range    Prothrombin Time  27.4 (H) 11.4 - 15.2 seconds    INR 2.6 (H) 0.8 - 1.2      Comment: (NOTE) INR goal varies based on device and disease states. Performed at [facility], [address] [place], [place] [address]    Protime-INR     Status: Abnormal    Collection Time: 08/12/18  4:59 AM  Result Value Ref Range    Prothrombin Time 28.2 (H) 11.4 - 15.2 seconds    INR 2.7 (H) 0.8 - 1.2      Comment: (NOTE) INR goal varies based on device and disease states. Performed at [facility], [address] [place], [place] [address]        Imaging Results (Last 48 hours)  No results found.           Medical Problem List and Plan: 1.   Debility secondary to acute on chronic respiratory failure. Taper of prednisone .Patient not on prednisone prior to admission. Continue chronic oxygen 3 L as prior to admission.patient does not have a routine pulmonologist and will need a referral CIR Evals PT, OT in am 2.  Antithrombotics: -DVT/anticoagulation:  Coumadin for pulmonary emboli             -antiplatelet therapy:ASA  qd 3. Pain Management:  Voltaren as directed.Tylenol as needed 4. Mood:  Provide emotional support             -antipsychotic agents:  Clonazepam 0.5 mg  3 times a day 5. Neuropsych: This patient is capable of making decisions on her own behalf. 6. Skin/Wound Care:  Routine skin checks 7. Fluids/Electrolytes/Nutrition:  Routine in and out's with follow-up chemistries 8. Tracheostomy01/13/2020 per Dr. Dillard Cannon. Cannulated 9.chronic aplastic anemia. Patient multiple transfusions in the past. Follow-up CBC 10. Diastolic congestive heart failure. Lasix 40 mg daily. Monitor for any signs of fluid overload 11. Hypertension. Amlodipine 5 mg daily, metoprolol 75 mg every 8 hours. Monitor with increased mobility 12. Constipation. Laxative assistance   Post Admission Physician Evaluation: 1. Functional deficits secondary  to Debility after respiratory failure. 2. Patient admitted to receive collaborative, interdisciplinary care between the physiatrist, rehab nursing staff, and therapy team. 3. Patient's level of medical complexity and substantial therapy needs in context of that medical necessity cannot be provided at a lesser intensity of care. 4. Patient has experienced substantial functional loss from his/her baseline..  Judging by the patient's diagnosis, physical exam, and functional history, the patient has potential for functional progress which will result in measurable gains while on inpatient rehab.  These gains will be of substantial and practical use upon discharge in facilitating mobility and self-care at the household level. 5. Physiatrist will provide 24 hour management of medical needs as well as oversight of the therapy plan/treatment and provide guidance as appropriate regarding the interaction of the two. 6. 24 hour rehab nursing will assist in the management of  bladder management, bowel management, safety, skin/wound care, disease management, medication administration, pain management and patient education  and help integrate therapy concepts, techniques,education, etc. 7. PT will assess and treat for:pre gait, gait training,  endurance , safety, equipment, neuromuscular re education  .  Goals are: supervision. 8. OT will assess and treat for  . ADLs, Cognitive perceptual skills, Neuromuscular re education, safety, endurance, equipment Goals are: supervision.  9. SLP will assess and treat for  .  Goals are: N/A. 10. Case Management and Social Worker will assess and treat for psychological issues and discharge planning.  11. Team conference will be held weekly to assess progress toward goals and to determine barriers to discharge. 12.  Patient will receive at least 3 hours of therapy per day at least 5 days per week. 13. ELOS and Prognosis: 10-14d good  "I have personally performed a face to face diagnostic evaluation of this patient.  Additionally, I have reviewed and concur with the physician assistant's documentation above."   Erick Colace M.D. [facility] FAAPM&R (Sports Med, Neuromuscular Med) Diplomate Am Board of Electrodiagnostic Med  Lynnae Prude 08/12/2018

## 2018-08-12 NOTE — PMR Pre-admission (Signed)
[facility] PMR Admission Coordinator Pre-Admission Assessment  Patient: Kelsey Jensen is an 68 y.o., female MRN: [id number] DOB: [birth year] Height:   Weight:    Insurance Information HMO:     PPO:      PCP:      IPA:      80/20:      OTHER: no HMO PRIMARY: Medicare a and b      Policy#: [id number]      Subscriber: pt Benefits:  Phone #: passport one online     Name: 08/11/2018 Eff. Date: 04/16/2013     Deduct: $1408      Out of Pocket Max: none      Life Max: none CIR: 100%      SNF: 20 full days Outpatient: 80%     Co-Pay: 20% Home Health: 100%      Co-Pay: 20% DME: 80%     Co-Pay: 20% Providers: pt choice  SECONDARY: [place]      Policy#: [id number]      Subscriber: pt  Patient acute hospital 06/10/18 until 06/17/2018 then to [facility] special[facility] hospital. Admitted to [facility] 08/12/2018. Patient has used 62 days of 90 acute hospital days before reaching her lifetime reserve days. Will have 28 days before LTR days  Medicaid Application Date:       Case Manager:  Disability Application Date:       Case Worker:   Emergency Contact Information Contact Information    Name Relation Home Work Mobile   Amrutha, Avera Son   [phone]   [name] Granddaughter   [phone]      Current Medical History  Patient Admitting Diagnosis: Debility; respiratory failure  History of Present Illness: UBAH RADKE is a 68 year old right-handed female with history of acute on chronic respiratory failure maintained on chronic 3 L of oxygen at home as well as history of hypertension, diastolic congestive heart failure, atrial fibrillation maintained on metoprolol.chronic aplastic anemia requiring multiple transfusions. Patient is followed by [facility] family practice in [place], [place] fax [phone]. Patient initially presented to [facility] 06/09/2018 with increasing shortness of breath requiring intubation as well as hemoglobin 4.8. She was  subsequently transfused and stabilized. Patient transferred to [facility] on 06/17/2018 remaining intubated and later underwent tracheostomy 06/28/2018 per Dr. Melony Overly. Patient is since been de cannulated remaining on chronic oxygen as prior to admission. There were initial concerns of possible need for PEG tube for nutritional support but due to anatomy noted moderate size hiatal hernia  And gastric positioning behind the left lobe of the liver and transverse colon procedure could not be completed. Initially with nasogastric tube feeds her diet has since been advanced to a regular consistency. On 08/01/2018 increasing shortness of breath CT the chest showed bilateral lobar pulmonary emboli right greater than left and evidence of right heart strain. Patient was cleared to begin heparin transitioned to Coumadin. Cardiology services follow-up for noted heart strain Dr. Doylene Canard and continue to monitor. Patient did have a history of atrial fibrillation and was advised to continue metoprolol with cardiac rate controlled. Close monitoring of hemoglobin with noted history of chronic aplastic anemia last hemoglobin 13.8 as well as INR 2.7.   Patient's medical record from [facility] has been reviewed by the rehabilitation admission coordinator and physician.  Past Medical History  Past Medical History:  Diagnosis Date  . Acute pulmonary embolism (HCC)   Aplastic anemia HTN Diastolic congestive Heart Failure Atrial fibrillation Chronic respiratory  failure  Family History   family history is not on file.  Prior Rehab/Hospitalizations Has the patient had major surgery during 100 days prior to admission? No    Current Medications Humalog Amlodipine Aspirin Atorvastatin calcium Clonazepam Famotidine Ferrous sulfate Furosemide Warfarin Meclizine Metoprolol Pantoprazole Prednisone Pro stat  Patients Current Diet:   Diet Order            Diet Heart  Room service appropriate? Yes with Assist; Fluid consistency: Thin  Diet effective now            Dysphagia 3 diet with thin liquids Poor dentition  Precautions / Restrictions     Has the patient had 2 or more falls or a fall with injury in the past year?No  Prior Activity Level  . patient states rare driving  Prior Functional Level Do you want   from other? Self Care: Did the patient need help bathing, dressing, using the toilet or eating?  Independent  Indoor Mobility: Did the patient need assistance with walking from room to room (with or without device)? Independent  Stairs: Did the patient need assistance with internal or external stairs (with or without device)? Independent  Functional Cognition: Did the patient need help planning regular tasks such as shopping or remembering to take medications? Independent  Home Assistive Devices / Equipment    Prior Device Use: Indicate devices/aids used by the patient prior to current illness, exacerbation or injury? Walker  Prior Functional Level  Mod I with RW or cane as she felt she needed. O2 at 3 liters nasal cannula at home; drove   Prior Functional Level Current Functional Level  Bed Mobility  Independent   min assist  Transfers Independent Mod assist to power up  Mobility - Walk/Wheelchair Independent Min assist pivotal steps to chair  Mobility - Ambulation/Gait Independent    Upper Body Dressing Independent Min assist  Lower Body Dressing Independent Mod to max assist  Grooming Independent Min assist  Eating/Drinking Independent Set up   Toilet Transfer Independent Mod assist  Bladder Continence continent External catheter  Bowel Management  continent continent LBM 2/27  Stair Climbing Independent Not attempted  Communication independent independent  Memory intact intact  Cooking/Meal Prep  Independent     Housework  independent   Money Management  Independent   Driving  rare     Special needs/care  consideration BiPAP/CPAP n/a CPM n/a Continuous Drip IV n/a Dialysis n/a Life Vest n/a Oxygen 3 liters nasal l cannula as at baseline pta Special Bed )2 at 3 liters nasal cannula at baseline pta Trach Size de cannulated; old trach site Wound Vac n/a Skin intact; som ecchymosis to BUE                              Bowel mgmt: continent LBM 2/27     Bladder mgmt:external catheter Diabetic mgmt n/a  Previous Home Environment    Discharge Living Setting    Social/Family/Support Systems    Goals/Additional Needs    Patient Condition: I have reviewed medical records from [facility] , spoken with CM patient and [place] daughter. I met with patient at the bedside for inpatient rehabilitation assessment.  Patient will benefit from ongoing PT and OT can actively participate in 3 hours of therapy a day 5 days of the week, and can make measurable gains during the admission.  Patient will also benefit from the coordinated team approach during an Inpatient  Acute Rehabilitation admission.  The patient will receive intensive therapy as well as Rehabilitation physician, nursing, social worker, and care management interventions.  Due to bowel management, bladder management, safety, skin/wound care, disease management, medical administration, pain management, patient education the patient requires 24 hour a day rehabilitation nursing.  The patient is currently Min/Mod A with mobility and basic ADLs.  Discharge setting and therapy post discharge at  home with home health is anticipated.  Patient has agreed to participate in the Acute Inpatient Rehabilitation Program and will admit today.  Preadmission Screen Completed By:  Charlett Blake RN MSN, 08/12/2018 10:58 AM ______________________________________________________________________   Discussed status with Dr. Letta Pate  on  08/12/2018 at  1051 and received telephone approval for admission today.  Admission Coordinator:  Charlett Blake RN MSN, time  1051 Date  08/12/2018   Assessment/Plan: Diagnosis:Debility secondary to respiratory failure, PE, PNA, Sepsis 1. Does the need for close, 24 hr/day  Medical supervision in concert with the patient's rehab needs make it unreasonable for this patient to be served in a less intensive setting? Yes 2. Co-Morbidities requiring supervision/potential complications: chronic diastolic CHF,Atrial fibrillation on anticoagulation with warfarin 3. Due to bladder management, bowel management, safety, skin/wound care, disease management, medication administration, pain management and patient education, does the patient require 24 hr/day rehab nursing? Yes 4. Does the patient require coordinated care of a physician, rehab nurse, PT (1-2 hrs/day, 5 days/week) and OT (1-2 hrs/day, 5 days/week) to address physical and functional deficits in the context of the above medical diagnosis(es)? Yes Addressing deficits in the following areas: balance, endurance, locomotion, strength, transferring, bowel/bladder control, bathing, dressing, feeding, grooming, toileting and psychosocial support 5. Can the patient actively participate in an intensive therapy program of at least 3 hrs of therapy 5 days a week? Yes 6. The potential for patient to make measurable gains while on inpatient rehab is excellent 7. Anticipated functional outcomes upon discharge from inpatients are: modified independent and supervision PT, modified independent and supervision OT, n/a SLP 8. Estimated rehab length of stay to reach the above functional goals is: 10-14d 9. Anticipated D/C setting: Home 10. Anticipated post D/C treatments: [facility] therapy 11. Overall Rehab/Functional Prognosis: excellent  Charlett Blake M.D. [place] FAAPM&R (Sports Med, Neuromuscular Med) Diplomate Am Board of Electrodiagnostic Med   Willy Eddy RN MSN Admissions Coordinator 08/12/2018

## 2018-08-12 NOTE — Progress Notes (Addendum)
ANTICOAGULATION CONSULT NOTE - Follow Up Consult  Pharmacy Consult for Warfarin Indication: pulmonary embolus  Allergies  Allergen Reactions  . Sulfa Antibiotics Nausea And Vomiting    Patient Measurements: Height: 5\' 4"  (162.6 cm) Weight: 204 lb 12.9 oz (92.9 kg) IBW/kg (Calculated) : 54.7  Vital Signs: Temp: 97.7 F (36.5 C) (02/27 1503) BP: 101/74 (02/27 1503) Pulse Rate: 111 (02/27 1503)  Labs: Recent Labs    08/10/18 0532 08/11/18 0546 08/12/18 0459  HGB 13.6 13.8  --   HCT 44.7 46.0  --   PLT 261 292  --   LABPROT 28.9* 27.4* 28.2*  INR 2.8* 2.6* 2.7*    Estimated Creatinine Clearance: 57.3 mL/min (A) (by C-G formula based on SCr of 1.01 mg/dL (H)).  Assessment: 68 year old female transferred from [facility] Started on warfarin 08/03/18 for PE  Previous dosing regimen as follows: 2/21 INR 1.10 -> 7.5 mg 2/22 INR 1.2 -> 7.5 mg 2/23 INR 1.52 - > 10 mg 2/24 INR 1.89 -> 10 mg 2/25 INR 2.8 -> 5 mg 2/26 INR 2.6 -> 5 mg 2/27 INR 2.7  Goal of Therapy:  INR 2-3 Monitor platelets by anticoagulation protocol: Yes   Plan:  Warfarin 5 mg po x 1 dose tonight at 1800 pm Daily INR  Thank you Okey Regal, PharmD [phone]  08/12/2018,3:34 PM

## 2018-08-12 NOTE — Progress Notes (Signed)
Kelsey Blake, MD  Physician  Physical Medicine and Rehabilitation  PMR Pre-admission  Signed  Date of Service:  08/12/2018 10:58 AM       Related encounter: Admission (Current) from 06/28/2018 in [facility]      Signed         Show:Clear all '[x]'$ Manual'[x]'$ Template'[x]'$ Copied  Added by: '[x]'$ Kirsteins, Luanna Salk, MD  '[]'$ Hover for details [employer] PMR Admission Coordinator Pre-Admission Assessment  Patient: Kelsey Jensen is an 68 y.o., female MRN: [id number] DOB: 1950/09/16 Height:   Weight:    Insurance Information HMO:     PPO:      PCP:      IPA:      80/20:      OTHER: no HMO PRIMARY: Medicare a and b      Policy#: [id number]      Subscriber: pt Benefits:  Phone #: passport one online     Name: 08/11/2018 Eff. Date: 04/16/2013     Deduct: $1408      Out of Pocket Max: none      Life Max: none CIR: 100%      SNF: 20 full days Outpatient: 80%     Co-Pay: 20% Home Health: 100%      Co-Pay: 20% DME: 80%     Co-Pay: 20% Providers: pt choice  SECONDARY: [place]      Policy#: [id number]      Subscriber: pt  Patient acute hospital 06/10/18 until 06/17/2018 then to [facility] special[facility] hospital. Admitted to [facility] 08/12/2018. Patient has used 62 days of 90 acute hospital days before reaching her lifetime reserve days. Will have 28 days before LTR days  Medicaid Application Date:       Case Manager:  Disability Application Date:       Case Worker:   Emergency Contact Information         Contact Information    Name Relation Home Work Mobile   [name], [name] Son   [phone]   [name] Granddaughter   [phone]      Current Medical History  Patient Admitting Diagnosis: Debility; respiratory failure  History of Present Illness: Kelsey Jensen is a 68 year old right-handed female with history of acute on chronic respiratory failure maintained on chronic 3 L of oxygen at home as well as history of hypertension,  diastolic congestive heart failure, atrial fibrillation maintained on metoprolol.chronic aplastic anemia requiring multiple transfusions. Patient is followed by [facility] family practice in [place], [place] [phone] #[phone] Patient initially presented [facility]2018 with increasing shortness of breath requiring intubation as well as hemoglobin 4.8. She was subsequently transfused and stabilized. [facility] remaining intubated and later underwent tracheostomy 06/28/2018 per Dr. CMelony Overly Patient is since been de cannulated remaining on chronic oxygen as prior to admission. There were initial concerns of possible need for PEG tube for nutritional support but due to anatomy noted moderate size hiatal hernia And gastric positioning behind the left lobe of the liver and transverse colon procedure could not be completed. Initially with nasogastric tube feeds her diet has since been advanced to a regular consistency. On 08/01/2018 increasing shortness of breath CT the chest showed bilateral lobar pulmonary emboli right greater than left and evidence of right heart strain. Patient was cleared to begin heparin transitioned to Coumadin. Cardiology services follow-up for noted heart strain Dr. KDoylene Canardand continue to monitor. Patient did have a history of atrial fibrillation and was advised to  continue metoprolol with cardiac rate controlled. Close monitoring of hemoglobin with noted history of chronic aplastic anemia last hemoglobin 13.8 as well as INR 2.7.   Patient's medical record from [facility] has been reviewed by the rehabilitation admission coordinator and physician.  Past Medical History      Past Medical History:  Diagnosis Date  . Acute pulmonary embolism (HCC)   Aplastic anemia HTN Diastolic congestive Heart Failure Atrial fibrillation Chronic respiratory failure  Family History     family history is not on file.  Prior Rehab/Hospitalizations Has the patient had major surgery during 100 days prior to admission? No               Current Medications Humalog Amlodipine Aspirin Atorvastatin calcium Clonazepam Famotidine Ferrous sulfate Furosemide Warfarin Meclizine Metoprolol Pantoprazole Prednisone Pro stat  Patients Current Diet:      Diet Order                  Diet Heart Room service appropriate? Yes with Assist; Fluid consistency: Thin  Diet effective now             Dysphagia 3 diet with thin liquids Poor dentition  Precautions / Restrictions     Has the patient had 2 or more falls or a fall with injury in the past year?No  Prior Activity Level  . patient states rare driving  Prior Functional Level Do you want   from other? Self Care: Did the patient need help bathing, dressing, using the toilet or eating?  Independent  Indoor Mobility: Did the patient need assistance with walking from room to room (with or without device)? Independent  Stairs: Did the patient need assistance with internal or external stairs (with or without device)? Independent  Functional Cognition: Did the patient need help planning regular tasks such as shopping or remembering to take medications? Independent  Home Assistive Devices / Equipment  Prior Device Use: Indicate devices/aids used by the patient prior to current illness, exacerbation or injury? Walker  Prior Functional Level  Mod I with RW or cane as she felt she needed. O2 at 3 liters nasal cannula at home; drove   Prior Functional Level Current Functional Level  Bed Mobility  Independent   min assist  Transfers Independent Mod assist to power up  Mobility - Walk/Wheelchair Independent Min assist pivotal steps to chair  Mobility - Ambulation/Gait Independent   Upper Body Dressing Independent Min assist  Lower Body Dressing Independent Mod to max assist  Grooming  Independent Min assist  Eating/Drinking Independent Set up   Toilet Transfer Independent Mod assist  Bladder Continence continent External catheter  Bowel Management  continent continent LBM 2/27  Stair Climbing Independent Not attempted  Communication independent independent  Memory intact intact  Cooking/Meal Prep  Independent     Housework  independent   Money Management  Independent   Driving  rare     Special needs/care consideration BiPAP/CPAP n/a CPM n/a Continuous Drip IV n/a Dialysis n/a Life Vest n/a Oxygen 3 liters nasal l cannula as at baseline pta Special Bed )2 at 3 liters nasal cannula at baseline pta Trach Size de cannulated; old trach site Wound Vac n/a Skin intact; som ecchymosis to BUE                              Bowel mgmt: continent LBM 2/27  Bladder mgmt:external catheter Diabetic mgmt n/a  Previous Home Environment  Discharge Living Setting  Social/Family/Support Systems  Goals/Additional Needs  Patient Condition: I have reviewed medical records from [facility] , spoken with CM patient and [place] daughter. I met with patient at the bedside for inpatient rehabilitation assessment.  Patient will benefit from ongoing PT and OT can actively participate in 3 hours of therapy a day 5 days of the week, and can make measurable gains during the admission.  Patient will also benefit from the coordinated team approach during an Inpatient Acute Rehabilitation admission.  The patient will receive intensive therapy as well as Rehabilitation physician, nursing, social worker, and care management interventions.  Due to bowel management, bladder management, safety, skin/wound care, disease management, medical administration, pain management, patient education the patient requires 24 hour a day rehabilitation nursing.  The patient is currently Min/Mod A with mobility and basic ADLs.  Discharge  setting and therapy post discharge at  home with home health is anticipated.  Patient has agreed to participate in the Acute Inpatient Rehabilitation Program and will admit today.  Preadmission Screen Completed By:  Danne Baxter RN MSN, 08/12/2018 10:58 AM ______________________________________________________________________   Discussed status with Dr. Letta Pate  on  08/12/2018 at  1051 and received telephone approval for admission today.  Admission Coordinator:  Danne Baxter RN MSN, time  1051 Date  08/12/2018   Assessment/Plan: Diagnosis:Debility secondary to respiratory failure, PE, PNA, Sepsis 1. Does the need for close, 24 hr/day  Medical supervision in concert with the patient's rehab needs make it unreasonable for this patient to be served in a less intensive setting? Yes 2. Co-Morbidities requiring supervision/potential complications: chronic diastolic CHF,Atrial fibrillation on anticoagulation with warfarin 3. Due to bladder management, bowel management, safety, skin/wound care, disease management, medication administration, pain management and patient education, does the patient require 24 hr/day rehab nursing? Yes 4. Does the patient require coordinated care of a physician, rehab nurse, PT (1-2 hrs/day, 5 days/week) and OT (1-2 hrs/day, 5 days/week) to address physical and functional deficits in the context of the above medical diagnosis(es)? Yes Addressing deficits in the following areas: balance, endurance, locomotion, strength, transferring, bowel/bladder control, bathing, dressing, feeding, grooming, toileting and psychosocial support 5. Can the patient actively participate in an intensive therapy program of at least 3 hrs of therapy 5 days a week? Yes 6. The potential for patient to make measurable gains while on inpatient rehab is excellent 7. Anticipated functional outcomes upon discharge from inpatients are: modified independent and supervision PT, modified independent and  supervision OT, n/a SLP 8. Estimated rehab length of stay to reach the above functional goals is: 10-14d 9. Anticipated D/C setting: Home 10. Anticipated post D/C treatments: [place] therapy 11. Overall Rehab/Functional Prognosis: excellent  Kelsey Jensen M.D. [facility] FAAPM&R (Sports Med, Neuromuscular Med) Diplomate [employer] Board of [place] RN MSN Admissions Coordinator 08/12/2018

## 2018-08-12 NOTE — IPOC Note (Signed)
Overall Plan of Care [facility]) Patient Details Name: Kelsey Jensen MRN: [id number] DOB: 11-30-1950  Admitting Diagnosis: Debility  Hospital Problems: Active Problems:   Debility   Supplemental oxygen dependent   Essential hypertension   Aplastic anemia (HCC)   Acute pain of left knee   Pulmonary embolism (HCC)     Functional Problem List: Nursing Bladder, Skin Integrity, Bowel, Endurance, Medication Management, Safety, Pain  PT Balance, Behavior, Endurance, Safety  OT Balance, Cognition, Endurance, Motor, Pain, Perception, Safety, Sensory  SLP    TR         Basic ADL's: OT Grooming, Bathing, Dressing, Toileting     Advanced  ADL's: OT       Transfers: PT Bed to Chair, Car, Furniture, Floor, [employer], Tub/Shower     Locomotion: PT Ambulation, Psychologist, prison and probation services, Stairs     Additional Impairments: OT    SLP        TR      Anticipated Outcomes Item Anticipated Outcome  Self Feeding S  Swallowing      Basic self-care  S  Toileting  S   Bathroom Transfers S  Bowel/Bladder  manage with mod I assist  Transfers  Supervision assist with LRAD   Locomotion  Supervision assist with LRAD at ambulatory level   Communication     Cognition     Pain  manage at or below level 4  Safety/Judgment  maintain with cues/resources   Therapy Plan: PT Intensity: Minimum of 1-2 x/day ,45 to 90 minutes PT Frequency: 5 out of 7 days PT Duration Estimated Length of Stay: 12-14 days  OT Intensity: Minimum of 1-2 x/day, 45 to 90 minutes OT Frequency: 5 out of 7 days OT Duration/Estimated Length of Stay: 10-14      Team Interventions: Nursing Interventions Patient/Family Education, Bowel Management, Bladder Management, Disease Management/Prevention, Medication Management, Skin Care/Wound Management, Pain Management, Discharge Planning  PT interventions Ambulation/gait training, Balance/vestibular training, Cognitive remediation/compensation, Community  reintegration, Discharge planning, Disease management/prevention, DME/adaptive equipment instruction, Neuromuscular re-education, Functional mobility training, Pain management, Psychosocial support, Skin care/wound management, Stair training, Splinting/orthotics, Therapeutic Activities, Therapeutic Exercise, UE/LE Strength taining/ROM, UE/LE Coordination activities, Visual/perceptual remediation/compensation, Wheelchair propulsion/positioning, Patient/family education  OT Interventions Community reintegration, Warden/ranger, Disease mangement/prevention, Neuromuscular re-education, Patient/family education, Self Care/advanced ADL retraining, Splinting/orthotics, Therapeutic Exercise, UE/LE Coordination activities, Wheelchair propulsion/positioning, Cognitive remediation/compensation, Discharge planning, DME/adaptive equipment instruction, Functional mobility training, Pain management, Psychosocial support, Skin care/wound managment, Therapeutic Activities, UE/LE Strength taining/ROM, Visual/perceptual remediation/compensation  SLP Interventions    TR Interventions    SW/CM Interventions Discharge Planning, Psychosocial Support, Patient/Family Education   Barriers to Discharge MD  Medical stability  Nursing      PT Inaccessible home environment, Home environment access/layout    OT Inaccessible home environment    SLP      SW       Team Discharge Planning: Destination: PT-Home ,OT- Home , SLP-  Projected Follow-up: PT-Home health PT, OT-  Home health OT, SLP-  Projected Equipment Needs: PT-Wheelchair cushion (measurements), Wheelchair (measurements), To be determined, OT-  , SLP-  Equipment Details: PT- , OT-none required Patient/family involved in discharge planning: PT- Patient,  OT-Patient, SLP-   MD ELOS: 10-14 days. Medical Rehab Prognosis:  Excellent Assessment: 68 year old right-handed female with history of acute on chronic respiratory failure maintained on chronic 3  L of oxygen at home as well as history of hypertension, diastolic congestive heart failure, atrial fibrillation maintained on metoprolol.chronic aplastic anemia requiring multiple transfusions.  Patient initially presented to [facility]/09/2018 with increasing shortness of breath requiring intubation as well as hemoglobin 4.8. She was subsequently transfused and stabilized. Patient transferred [facility] on 01/02/2020for prolonged intubationand later underwent tracheostomy 06/28/2018 per Dr. Dillard Cannon. Patient is since been decannulated remaining on chronic oxygen as prior to admission. There were initial concerns of possible need for PEG tube for nutritional support but due to anatomy noted moderate size hiatal hernia And gastric positioning behind the left lobe of the liver and transverse colon procedure could not be completed. Initially with nasogastric tube feeds her diet has since been advanced to a regular consistency. On 08/01/2018 increasing shortness of breath CT the chest showed bilateral lobar pulmonary emboli right greater than left and evidence of right heart strain. Patient was cleared to begin heparin transitioned to Coumadin. Cardiology services follow-up for noted heart strain Dr. Algie Coffer and continue to monitor. Patient did have a history of atrial fibrillation and was advised to continue metoprolol with cardiac rate controlled. Close monitoring of hemoglobin with noted history of chronic aplastic anemia latus hemoglobin 13.8 as well as INR 2.7. Therapy evaluations have been ongoing patient requiring minimum moderate assist for mobility with noted deconditioning.  We will set goals for Supervision with PT/OT.   See Team Conference Notes for weekly updates to the plan of care

## 2018-08-13 ENCOUNTER — Inpatient Hospital Stay (HOSPITAL_COMMUNITY): Payer: Medicare Other

## 2018-08-13 ENCOUNTER — Inpatient Hospital Stay (HOSPITAL_COMMUNITY): Payer: Medicare Other | Admitting: Occupational Therapy

## 2018-08-13 ENCOUNTER — Inpatient Hospital Stay (HOSPITAL_COMMUNITY): Payer: Medicare Other | Admitting: Physical Therapy

## 2018-08-13 DIAGNOSIS — I10 Essential (primary) hypertension: Secondary | ICD-10-CM

## 2018-08-13 DIAGNOSIS — I2699 Other pulmonary embolism without acute cor pulmonale: Secondary | ICD-10-CM

## 2018-08-13 DIAGNOSIS — D619 Aplastic anemia, unspecified: Secondary | ICD-10-CM

## 2018-08-13 DIAGNOSIS — M25562 Pain in left knee: Secondary | ICD-10-CM

## 2018-08-13 DIAGNOSIS — I5032 Chronic diastolic (congestive) heart failure: Secondary | ICD-10-CM

## 2018-08-13 DIAGNOSIS — Z9981 Dependence on supplemental oxygen: Secondary | ICD-10-CM

## 2018-08-13 LAB — CBC WITH DIFFERENTIAL/PLATELET
Abs Immature Granulocytes: 0.09 10*3/uL — ABNORMAL HIGH (ref 0.00–0.07)
Basophils Absolute: 0 10*3/uL (ref 0.0–0.1)
Basophils Relative: 0 %
Eosinophils Absolute: 0.1 10*3/uL (ref 0.0–0.5)
Eosinophils Relative: 1 %
HCT: 43.5 % (ref 36.0–46.0)
Hemoglobin: 13.3 g/dL (ref 12.0–15.0)
Immature Granulocytes: 1 %
Lymphocytes Relative: 21 %
Lymphs Abs: 1.6 10*3/uL (ref 0.7–4.0)
MCH: 29.3 pg (ref 26.0–34.0)
MCHC: 30.6 g/dL (ref 30.0–36.0)
MCV: 95.8 fL (ref 80.0–100.0)
Monocytes Absolute: 0.6 10*3/uL (ref 0.1–1.0)
Monocytes Relative: 8 %
Neutro Abs: 5.4 10*3/uL (ref 1.7–7.7)
Neutrophils Relative %: 69 %
Platelets: 262 10*3/uL (ref 150–400)
RBC: 4.54 MIL/uL (ref 3.87–5.11)
RDW: 23.1 % — ABNORMAL HIGH (ref 11.5–15.5)
WBC: 7.8 10*3/uL (ref 4.0–10.5)
nRBC: 0 % (ref 0.0–0.2)

## 2018-08-13 LAB — COMPREHENSIVE METABOLIC PANEL
ALT: 20 U/L (ref 0–44)
AST: 17 U/L (ref 15–41)
Albumin: 3.2 g/dL — ABNORMAL LOW (ref 3.5–5.0)
Alkaline Phosphatase: 65 U/L (ref 38–126)
Anion gap: 11 (ref 5–15)
BUN: 43 mg/dL — AB (ref 8–23)
CO2: 28 mmol/L (ref 22–32)
Calcium: 9.1 mg/dL (ref 8.9–10.3)
Chloride: 100 mmol/L (ref 98–111)
Creatinine, Ser: 1.02 mg/dL — ABNORMAL HIGH (ref 0.44–1.00)
GFR calc non Af Amer: 56 mL/min — ABNORMAL LOW (ref >60)
Glucose, Bld: 83 mg/dL (ref 70–99)
Potassium: 4.2 mmol/L (ref 3.5–5.1)
Sodium: 139 mmol/L (ref 135–145)
Total Bilirubin: 0.6 mg/dL (ref 0.3–1.2)
Total Protein: 6.7 g/dL (ref 6.5–8.1)

## 2018-08-13 LAB — PROTIME-INR
INR: 2.9 — ABNORMAL HIGH (ref 0.8–1.2)
Prothrombin Time: 29.5 seconds — ABNORMAL HIGH (ref 11.4–15.2)

## 2018-08-13 LAB — GLUCOSE, CAPILLARY
Glucose-Capillary: 75 mg/dL (ref 70–99)
Glucose-Capillary: 115 mg/dL — ABNORMAL HIGH (ref 70–99)
Glucose-Capillary: 217 mg/dL — ABNORMAL HIGH (ref 70–99)
Glucose-Capillary: 256 mg/dL — ABNORMAL HIGH (ref 70–99)

## 2018-08-13 MED ORDER — METHOCARBAMOL 500 MG PO TABS
500.0000 mg | ORAL_TABLET | Freq: Three times a day (TID) | ORAL | Status: DC | PRN
  Administered 2018-08-13 – 2018-08-25 (×19): 500 mg via ORAL
  Filled 2018-08-13 (×20): qty 1

## 2018-08-13 MED ORDER — WARFARIN SODIUM 2 MG PO TABS
4.0000 mg | ORAL_TABLET | Freq: Once | ORAL | Status: AC
  Administered 2018-08-13: 4 mg via ORAL
  Filled 2018-08-13: qty 2

## 2018-08-13 NOTE — Progress Notes (Signed)
Occupational Therapy Session Note  Patient Details  Name: Kelsey Jensen MRN: [id number] Date of Birth: 10/04/1950  Today's Date: 08/13/2018 OT Individual Time: 1400-1500 OT Individual Time Calculation (min): 60 min    Short Term Goals: Week 1:  OT Short Term Goal 1 (Week 1): Pt will stand at sink for oral care to demo improve endurance OT Short Term Goal 2 (Week 1): Pt will don B socks wiht S and AE PRN OT Short Term Goal 3 (Week 1): Pt will transfer to toilet wiht MIN A OT Short Term Goal 4 (Week 1): Pt will stand for >3 min and MIN A to demo improved functional standing tolerance required for ADLs  Skilled Therapeutic Interventions/Progress Updates:     Patient seated in w/c and ready for pm session today.  3L O2 via [place].  She is pleasant and cooperative, notes discomfort in left ankle declined offer for ice.  SPT with RW to/from w/c and toilet with min A.  Toileting/clothing management with mod A.  Grooming tasks completed with min A overall - set up for oral care, min A for hair care and set up for hand hygiene at sink due to limited reach. Completed unsupported sitting posture/core exercises with min difficulty and rest breaks needed between exercises.  Able to stand with RW for 2 minutes x2.    Patient remained in w/c with seat belt alarm set at close of session.  She states that she is happy with progress today and eager to return home.   Community reintegration;Balance/vestibular training;Disease mangement/prevention;Neuromuscular re-education;Patient/family education;Self Care/advanced ADL retraining;Splinting/orthotics;Therapeutic Exercise;UE/LE Coordination activities;Wheelchair propulsion/positioning;Cognitive remediation/compensation;Discharge planning;DME/adaptive equipment instruction;Functional mobility training;Pain management;Psychosocial support;Skin care/wound managment;Therapeutic Activities;UE/LE Strength taining/ROM;Visual/perceptual remediation/compensation   Therapy  Documentation Precautions:  Precautions Precautions: Fall Restrictions Weight Bearing Restrictions: No General:   Vital Signs: Therapy Vitals Temp: 97.8 F (36.6 C) Temp Source: Oral Pulse Rate: 100 Resp: 20 BP: 110/78 Patient Position (if appropriate): Sitting Oxygen Therapy SpO2: 97 % O2 Device: Nasal Cannula O2 Flow Rate (L/min): 3 L/min Pain: Pain Assessment Pain Scale: 0-10 Pain Score: 2  Exercises:   Other Treatments:     Therapy/Group: Individual Therapy  Barrie Lyme 08/13/2018, 3:41 PM

## 2018-08-13 NOTE — Progress Notes (Signed)
Per nursing, patient was given "Data Collection Information Summary for Patients in Inpatient Rehabilitation Facilities with attached Privacy Act Statement Health Care Records" upon admission.    Patient information reviewed and entered into eRehab System by Becky Jax Abdelrahman, PPS coordinator. Information including medical coding, function ability, and quality indicators will be reviewed and updated through discharge.   

## 2018-08-13 NOTE — Evaluation (Signed)
Physical Therapy Assessment and Plan  Patient Details  Name: Kelsey Jensen MRN: [id number] Date of Birth: [birth year]  PT Diagnosis: Abnormality of gait, Difficulty walking and Muscle weakness Rehab Potential: Excellent ELOS: 12-14 days    Today's Date: 08/13/2018 PT Individual Time: 0277-4128 PT Individual Time Calculation (min): 75 min    Problem List:  Patient Active Problem List   Diagnosis Date Noted  . Supplemental oxygen dependent   . Essential hypertension   . Aplastic anemia ([place])   . Acute pain of left knee   . Pulmonary embolism ([place])   . Debility 08/12/2018  . Acute pulmonary embolism ([place])   . Acute on chronic respiratory failure with hypoxia ([place]) 06/18/2018  . Severe sepsis with septic shock ([place]) 06/18/2018  . Chronic congestive heart failure ([place]) 06/18/2018  . 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 AF (paroxysmal atrial fibrillation) ([place]) 06/18/2018    Past Medical History:  Past Medical History:  Diagnosis Date  . Acute pulmonary embolism ([place])   . Anemia   . Arthritis   . CHF (congestive heart failure) ([place])   . COPD (chronic obstructive pulmonary disease) ([place])   . GERD (gastroesophageal reflux disease)   . Hypertension    Past Surgical History:  Past Surgical History:  Procedure Laterality Date  . APPENDECTOMY    . CHOLECYSTECTOMY    . EYE SURGERY Left   . TRACHEOSTOMY TUBE PLACEMENT N/A 06/28/2018   Procedure: TRACHEOSTOMY;  Surgeon: Rozetta Nunnery, MD;  Location: [facility] OR;  Service: ENT;  Laterality: N/A;    Assessment & Plan Clinical Impression: Patient is a 68 year old right-handed female with history of acute on chronic respiratory failure maintained on chronic 3 L of oxygen at home as well as history of hypertension, diastolic congestive heart failure, atrial fibrillation maintained on metoprolol.chronic aplastic anemia requiring multiple transfusions. Patient is followed by [facility] family practice in  [place], [place] [phone] #[phone] [place] to entry. She used a rolling walker prior to admission. She does not drive but was able to perform her own ADLs. Family does assist as needed. [facility] increasing shortness of breath requiring intubation as well as hemoglobin 4.8. She was subsequently transfused and stabilized. [facility] later underwent tracheostomy 06/28/2018 per Dr. CMelony Overly Patient is since been decannulated remaining on chronic oxygen as prior to admission. There were initial concerns of possible need for PEG tube for nutritional support but due to anatomy noted moderate size hiatal hernia And gastric positioning behind the left lobe of the liver and transverse colon procedure could not be completed. Initially with nasogastric tube feeds her diet has since been advanced to a regular consistency. On 08/01/2018 increasing shortness of breath CT the chest showed bilateral lobar pulmonary emboli right greater than left and evidence of right heart strain. Patient was cleared to begin heparin transitioned to Coumadin. Patient transferred to CIR on 08/12/2018 .   Patient currently requires mod with mobility secondary to muscle weakness and muscle joint tightness, decreased cardiorespiratoy endurance and decreased oxygen support and decreased standing balance, decreased postural control and decreased balance strategies.  Prior to hospitalization, patient was modified independent  with mobility and lived with Son in a Mobile home home.  Home access is 3Stairs to enter.  Patient will benefit from skilled PT intervention to maximize safe functional mobility, minimize fall  risk and decrease caregiver burden for planned discharge home with 24 hour supervision.  Anticipate  patient will benefit from follow up [place] at discharge.  PT Assessment Rehab Potential (ACUTE/IP ONLY): Excellent PT Barriers to Discharge: Inaccessible home environment;Home environment access/layout PT Patient demonstrates impairments in the following area(s): Balance;Behavior;Endurance;Safety PT Transfers Functional Problem(s): Bed to Chair;Car;Furniture;Floor;Bed Mobility PT Locomotion Functional Problem(s): Ambulation;Wheelchair Mobility;Stairs PT Plan PT Intensity: Minimum of 1-2 x/day ,45 to 90 minutes PT Frequency: 5 out of 7 days PT Duration Estimated Length of Stay: 12-14 days  PT Treatment/Interventions: Ambulation/gait training;Balance/vestibular training;Cognitive remediation/compensation;Community reintegration;Discharge planning;Disease management/prevention;DME/adaptive equipment instruction;Neuromuscular re-education;Functional mobility training;Pain management;Psychosocial support;Skin care/wound management;Stair training;Splinting/orthotics;Therapeutic Activities;Therapeutic Exercise;UE/LE Strength taining/ROM;UE/LE Coordination activities;Visual/perceptual remediation/compensation;Wheelchair propulsion/positioning;Patient/family education PT Transfers Anticipated Outcome(s): Supervision assist with LRAD  PT Locomotion Anticipated Outcome(s): Supervision assist with LRAD at ambulatory level  PT Recommendation Recommendations for Other Services: Therapeutic Recreation consult Therapeutic Recreation Interventions: Outing/community reintergration;Stress management Follow Up Recommendations: Home health PT Patient destination: Home Equipment Recommended: Wheelchair cushion (measurements);Wheelchair (measurements);To be determined  Skilled Therapeutic Intervention   Pt received supine in bed and agreeable to PT. Supine>sit transfer with mod assist and min cues for safety. Pt instructed in rolling R and L for use of bed pan to urinate. PT instructed patient in PT Evaluation and  initiated treatment intervention; see below for results. PT educated patient in [place], rehab potential, rehab goals, and discharge recommendations. Gait training without AD as listed below. Additional gait traing with RW  X 58f and min-mod assist from PT for safety. Bed mobility without rails in rehab gym with min assist insto supine and mod assist into sitting. Car transfers with Max assist from PT for safety and BLE management. [facility] returned to [facility] and [facility] and all needs met.        PT Evaluation Precautions/Restrictions Precautions Precautions: Fall Restrictions Weight Bearing Restrictions: No General   Vital Signs  Pain Pain Assessment Pain Scale: 0-10 Pain Score: 8  Pain Location: Back Pain Descriptors / Indicators: Aching Pain Intervention(s): RN made aware Home Living/Prior Functioning Home Living Available Help at Discharge: Family;Available 24 hours/day Type of Home: Mobile home Home Access: Stairs to enter Entrance Stairs-Number of Steps: 3 Entrance Stairs-Rails: Left Home Layout: One level Bathroom Shower/Tub: WMultimedia programmer Standard Bathroom Accessibility: Yes Additional Comments: has tub bench; bSC  Lives With: Son Prior Function Level of Independence: Requires assistive device for independence(3L O2)  Able to Take Stairs?: Yes Driving: No Vocation: On disability Comments: occassionally uses RW.  Vision/Perception  Vision - Assessment Eye Alignment: Within Functional Limits Alignment/Gaze Preference: Within Defined Limits Tracking/Visual Pursuits: Able to track stimulus in all quads without difficulty Saccades: Within functional limits Perception Perception: Within Functional Limits Praxis Praxis: Intact  Cognition Orientation Level: Oriented X4 Memory: Impaired Awareness: Impaired Problem Solving: Impaired Safety/Judgment: Impaired Sensation Sensation Light Touch: Appears  Intact Stereognosis: Appears Intact Coordination Gross Motor Movements are Fluid and Coordinated: No(limited by strength deficits ) Fine Motor Movements are Fluid and Coordinated: No Coordination and Movement Description: slow and discoordinated with opposition Finger Nose Finger Test: WDay Surgery At RiverbendHeel Shin Test: limit by hip strength.  Motor  Motor Motor: Other (comment) Motor - Discharge Observations: generalized weakness  Mobility Bed Mobility Bed Mobility: Rolling Right;Rolling Left;Supine to Sit;Sit to Supine Rolling Right: Minimal Assistance - Patient > 75% Rolling Left: Minimal Assistance - Patient > 75% Supine to Sit: Moderate Assistance - Patient 50-74% Sit to Supine: Contact Guard/Touching assist Transfers Transfers:  Sit to Stand;Stand Pivot Transfers Sit to Stand: Moderate Assistance - Patient 50-74% Stand Pivot Transfers: Moderate Assistance - Patient 50 - 74% Transfer (Assistive device): None Locomotion  Gait Ambulation: Yes Gait Assistance: Moderate Assistance - Patient 50-74% Gait Distance (Feet): 5 Feet Assistive device: None;1 person hand held assist Gait Gait: Yes Gait Pattern: Left flexed knee in stance;Narrow base of support Stairs / Additional Locomotion Stairs: No Wheelchair Mobility Wheelchair Mobility: Yes Wheelchair Assistance: Minimal assistance - Patient >75% Wheelchair Propulsion: Both upper extremities Wheelchair Parts Management: Needs assistance Distance: 165f  Trunk/Postural Assessment  Cervical Assessment Cervical Assessment: Exceptions to WFL(head forward) Thoracic Assessment Thoracic Assessment: Exceptions to WFL(rounded shoudlers) Lumbar Assessment Lumbar Assessment: Exceptions to WFL(posterior pelvic tilt) Postural Control Postural Control: Deficits on evaluation(insufficient)  Balance Balance Balance Assessed: Yes Static Sitting Balance Static Sitting - Level of Assistance: 6: Modified independent (Device/Increase time) Dynamic  Sitting Balance Dynamic Sitting - Level of Assistance: 5: Stand by assistance Static Standing Balance Static Standing - Level of Assistance: 4: Min assist Dynamic Standing Balance Dynamic Standing - Level of Assistance: 3: Mod assist Extremity Assessment  RUE Assessment RUE Assessment: Exceptions to WArkansas Dept. Of Correction-Diagnostic UnitGeneral Strength Comments: generalized weakness 0-90* in shoulders LUE Assessment LUE Assessment: Exceptions to WSt Louis Spine And Orthopedic Surgery CtrGeneral Strength Comments: generalized weakness 0-90* in shoulders RLE Assessment RLE Assessment: Exceptions to WSilver Cross Amb[facility] Surgery Center LLC Dba Silver Cross Surgery CenterGeneral Strength Comments: grossly 4/5 proximal to distal  LLE Assessment LLE Assessment: Exceptions to WDoctors' Center [facility]osp San Juan IncGeneral Strength Comments: grossly 4-/5 proximal to distal     Refer to Care Plan for Long Term Goals  Recommendations for other services: Therapeutic Recreation  Stress management and Outing/community reintegration  Discharge Criteria: Patient will be discharged from PT if patient refuses treatment 3 consecutive times without medical reason, if treatment goals not met, if there is a change in medical status, if patient makes no progress towards goals or if patient is discharged from hospital.  The above assessment, treatment plan, treatment alternatives and goals were discussed and mutually agreed upon: by patient  ALorie Phenix2/28/2020, 10:40 AM

## 2018-08-13 NOTE — Progress Notes (Signed)
ANTICOAGULATION CONSULT NOTE - Follow Up Consult  Pharmacy Consult for Warfarin Indication: pulmonary embolus  Allergies  Allergen Reactions  . Sulfa Antibiotics Nausea And Vomiting    Patient Measurements: Height: 5\' 4"  (162.6 cm) Weight: 204 lb 12.9 oz (92.9 kg) IBW/kg (Calculated) : 54.7  Vital Signs: Temp: 97.6 F (36.4 C) (02/28 0538) Temp Source: Oral (02/27 2008) BP: 115/90 (02/28 0538) Pulse Rate: 71 (02/28 0538)  Labs: Recent Labs    08/11/18 0546 08/12/18 0459 08/13/18 0715  HGB 13.8  --  13.3  HCT 46.0  --  43.5  PLT 292  --  262  LABPROT 27.4* 28.2* 29.5*  INR 2.6* 2.7* 2.9*    Estimated Creatinine Clearance: 57.3 mL/min (A) (by C-G formula based on SCr of 1.01 mg/dL (H)).  Assessment: 68 year old female transferred from [facility] Started on warfarin 08/03/18 for PE  Previous dosing regimen as follows: 2/21 INR 1.10 -> 7.5 mg 2/22 INR 1.2 -> 7.5 mg 2/23 INR 1.52 - > 10 mg 2/24 INR 1.89 -> 10 mg 2/25 INR 2.8 -> 5 mg 2/26 INR 2.6 -> 5 mg 2/27 INR 2.7 2/28 INR 2.9  Goal of Therapy:  INR 2-3 Monitor platelets by anticoagulation protocol: Yes   Plan:  Warfarin 4 mg po x 1 dose tonight at 1800 pm Daily INR  Isaac Bliss, PharmD, BCPS, BCCCP Clinical Pharmacist [phone]  Please check AMION for all [facility] numbers  08/13/2018 7:57 AM

## 2018-08-13 NOTE — Progress Notes (Signed)
Kelsey Jensen PHYSICAL MEDICINE & REHABILITATION PROGRESS NOTE  Subjective/Complaints: Patient seen sitting up in bed this morning.  She states she slept well overnight.  She states she is ready for therapies today.  She complains of left knee pain.  ROS: + Left knee pain.  Denies CP, shortness of breath, nausea, vomiting, diarrhea.  Objective: Vital Signs: Blood pressure 115/90, pulse 71, temperature 97.6 F (36.4 C), resp. rate 17, height 5\' 4"  (1.626 m), weight 92.9 kg, SpO2 98 %. No results found. Recent Labs    08/11/18 0546  WBC 6.1  HGB 13.8  HCT 46.0  PLT 292   Recent Labs    08/12/18 1030  K 4.6    Physical Exam: BP 115/90 (BP Location: Left Arm)   Pulse 71   Temp 97.6 F (36.4 C)   Resp 17   Ht 5\' 4"  (1.626 m)   Wt 92.9 kg   SpO2 98%   BMI 35.16 kg/m  Constitutional: No distress . Vital signs reviewed. HENT: Normocephalic.  Atraumatic. Eyes: EOMI. No discharge. Cardiovascular: RRR. No JVD. Respiratory: CTA Bilaterally. Normal effort.  + [place]. GI: BS +. Non-distended. Musc: Mild tenderness left knee. Neuro: Alert and oriented. Motor: Bilateral upper extremities: 4+/5 proximal distal Bilateral lower extremities: Hip flexion 3+/5, knee extension 4 medicine/5, ankle dorsiflexion 4+/5 Skin:Trach site healing Psych: Normal mood and behavior.   Assessment/Plan: 1. Functional deficits secondary to debility which require 3+ hours per day of interdisciplinary therapy in a comprehensive inpatient rehab setting.  Physiatrist is providing close team supervision and 24 hour management of active medical problems listed below.  Physiatrist and rehab team continue to assess barriers to discharge/monitor patient progress toward functional and medical goals  Care Tool:  Bathing              Bathing assist       Upper Body Dressing/Undressing Upper body dressing        Upper body assist      Lower Body Dressing/Undressing Lower body dressing             Lower body assist       Toileting Toileting Toileting Activity did not occur (Clothing management and hygiene only): Safety/medical concerns  Toileting assist Assist for toileting: Maximal Assistance - Patient 25 - 49%     Transfers Chair/bed transfer  Transfers assist  Chair/bed transfer activity did not occur: Safety/medical concerns        Locomotion Ambulation   Ambulation assist              Walk 10 feet activity   Assist           Walk 50 feet activity   Assist           Walk 150 feet activity   Assist           Walk 10 feet on uneven surface  activity   Assist           Wheelchair     Assist               Wheelchair 50 feet with 2 turns activity    Assist            Wheelchair 150 feet activity     Assist            Medical Problem List and Plan: 1.Debilitysecondary to acute on chronic respiratory failure. Patient not on prednisone prior to admission. Continue chronic oxygen 3 L as prior to  admission. Patient does not have a routine pulmonologist and will need a referral  Begin CIR  Notes reviewed, labs reviewed  Taper steroids 2. Antithrombotics: -DVT/anticoagulation:Coumadin for pulmonary emboli -antiplatelet therapy:ASA 81mg  qd 3. Pain Management:Voltaren as directed.Tylenol as needed  Robaxin as needed started on 2/28 4. Mood:Provide emotional support -antipsychotic agents: Clonazepam 0.5 mg 3 times a day 5. Neuropsych: This patientiscapable of making decisions on herown behalf. 6. Skin/Wound Care:Routine skin checks 7. Fluids/Electrolytes/Nutrition:Routine in and out's   BMP pending for today 8. 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 Dillard Cannon. Decannulated 9. Chronic aplastic anemia. Patient multiple transfusions in the past.   Hemoglobin 13.8 on 2/26  Labs pending for today 10. Diastolic congestive heart failure. Lasix 40 mg  daily. Monitor for any signs of fluid overload Filed Weights   08/12/18 1400  Weight: 92.9 kg  11. Hypertension. Amlodipine 5 mg daily, metoprolol 75 mg every 8 hours.   Monitor with increased mobility 12. Constipation. Laxative assistance  LOS: 1 days A FACE TO FACE EVALUATION WAS PERFORMED  Kelsey Jensen Kelsey Jensen 08/13/2018, 7:03 AM

## 2018-08-13 NOTE — Evaluation (Signed)
Occupational Therapy Assessment and Plan  Patient Details  Name: Kelsey Jensen MRN: [id number] Date of Birth: January 07, 1951  OT Diagnosis: abnormal posture, acute pain, cognitive deficits, muscle weakness (generalized) and pain in joint Rehab Potential:   ELOS:   10-14  Today's Date: 08/13/2018 OT Individual Time: 0700-0800 OT Individual Time Calculation (min): 60 min     Problem List:  Patient Active Problem List   Diagnosis Date Noted  . Supplemental oxygen dependent   . Essential hypertension   . Aplastic anemia ([place])   . Acute pain of left knee   . Pulmonary embolism ([place])   . Debility 08/12/2018  . Acute pulmonary embolism ([place])   . Acute on chronic respiratory failure with hypoxia ([place]) 06/18/2018  . Severe sepsis with septic shock ([place]) 06/18/2018  . Chronic congestive heart failure ([place]) 06/18/2018  . 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 AF (paroxysmal atrial fibrillation) ([place]) 06/18/2018    Past Medical History:  Past Medical History:  Diagnosis Date  . Acute pulmonary embolism ([place])   . Anemia   . Arthritis   . CHF (congestive heart failure) ([place])   . COPD (chronic obstructive pulmonary disease) ([place])   . GERD (gastroesophageal reflux disease)   . Hypertension    Past Surgical History:  Past Surgical History:  Procedure Laterality Date  . APPENDECTOMY    . CHOLECYSTECTOMY    . EYE SURGERY Left   . TRACHEOSTOMY TUBE PLACEMENT N/A 06/28/2018   Procedure: TRACHEOSTOMY;  Surgeon: Rozetta Nunnery, MD;  Location: [facility] OR;  Service: ENT;  Laterality: N/A;    Assessment & Plan Clinical Impression: Kelsey Jensen is a 68 year old right-handed female with history of acute on chronic respiratory failure maintained on chronic 3 L of oxygen at home as well as history of hypertension, diastolic congestive heart failure, atrial fibrillation maintained on metoprolol.chronic aplastic anemia requiring multiple transfusions. Patient is  followed by [facility] family practice in [place], [place] [phone] #[phone] patient [place] home 3 steps to entry. She used a rolling walker prior to admission. She does not drive but was able to perform her own ADLs. Family does assist as needed. [facility] increasing shortness of breath requiring intubation as well as hemoglobin 4.8. She was subsequently transfused and stabilized. [facility] later underwent tracheostomy 06/28/2018 per Dr. CMelony Overly Patient is since been decannulated remaining on chronic oxygen as prior to admission. There were initial concerns of possible need for PEG tube for nutritional support but due to anatomy noted moderate size hiatal hernia And gastric positioning behind the left lobe of the liver and transverse colon procedure could not be completed. Initially with nasogastric tube feeds her diet has since been advanced to a regular consistency. On 08/01/2018 increasing shortness of breath CT the chest showed bilateral lobar pulmonary emboli right greater than left and evidence of right heart strain. Patient was cleared to begin heparin transitioned to Coumadin. Cardiology services follow-up for noted heart strain Dr. KDoylene Canardand continue to monitor. Patient did have a history of atrial fibrillation and was advised to continue metoprolol with cardiac rate controlled. Close monitoring of hemoglobin with noted history of chronic aplastic anemia latus hemoglobin 13.8 as well as INR 2.7. Therapy evaluations have been ongoing patient requiring minimum moderate assist for mobility with noted deconditioning. Patient was admitted for a comprehensive rehabilitation program.  Patient  currently requires mod with basic self-care skills  secondary to muscle weakness, decreased  cardiorespiratoy endurance, decreased problem solving, decreased safety awareness and decreased memory and decreased sitting balance, decreased standing balance and decreased balance strategies .  Prior to hospitalization, patient could complete BADL with modified independent .  Patient will benefit from skilled intervention to increase independence with basic self-care skills prior to discharge home with care partner.  Anticipate patient will require 24 hour supervision and follow up home health.  OT - End of Session Activity Tolerance: Tolerates 30+ min activity without fatigue Endurance Deficit: Yes OT Assessment OT Barriers to Discharge: Inaccessible home environment OT Patient demonstrates impairments in the following area(s): Balance;Cognition;Endurance;Motor;Pain;Perception;Safety;Sensory OT Basic ADL's Functional Problem(s): Grooming;Bathing;Dressing;Toileting OT Transfers Functional Problem(s): Toilet;Tub/Shower OT Plan OT Intensity: Minimum of 1-2 x/day, 45 to 90 minutes OT Frequency: 5 out of 7 days OT Duration/Estimated Length of Stay: 10-14 OT Treatment/Interventions: Community reintegration;Balance/vestibular training;Disease mangement/prevention;Neuromuscular re-education;Patient/family education;Self Care/advanced ADL retraining;Splinting/orthotics;Therapeutic Exercise;UE/LE Coordination activities;Wheelchair propulsion/positioning;Cognitive remediation/compensation;Discharge planning;DME/adaptive equipment instruction;Functional mobility training;Pain management;Psychosocial support;Skin care/wound managment;Therapeutic Activities;UE/LE Strength taining/ROM;Visual/perceptual remediation/compensation OT Self Feeding Anticipated Outcome(s): S OT Basic Self-Care Anticipated Outcome(s): S OT Toileting Anticipated Outcome(s): S OT Bathroom Transfers Anticipated Outcome(s): S OT Recommendation Patient destination: Home Follow Up Recommendations: Home health OT Equipment Details:  none required   Skilled Therapeutic Intervention 1:1. Pt receved in bed reporting 8/10 back pain. RN notifed for medication delivery. Discussed heat/ice regimen and will discuss with RN team/MD about K pad. Pt completes ADL as follows at EOB. Pt educated on role/purpose of OT, CIR, ELOS, goals, pursed lip breathing, energy conservation and provided with AE pt has at home (reacher and sock aide). Pt able to reach B feet bathing EOB with crossing figure 4 but requires VC for rest break and pursed lip breathing as ot holds breath. Pt sit to stand at EOB  With MOD A. Pt requires A for teds and CGA for socks with sock aide. Exited sesion with pt seated in bed, call light in reach and all needs met  OT Evaluation Precautions/Restrictions  Precautions Precautions: Fall Restrictions Weight Bearing Restrictions: No General Chart Reviewed: Yes Family/Caregiver Present: No Vital Signs Therapy Vitals Temp: 97.6 F (36.4 C) Pulse Rate: 71 Resp: 17 BP: 115/90 Patient Position (if appropriate): Lying Oxygen Therapy SpO2: 98 % O2 Device: Nasal Cannula O2 Flow Rate (L/min): 3 L/min Pain Pain Assessment Pain Scale: 0-10 Pain Score: 8  Pain Location: Back Pain Descriptors / Indicators: Aching Pain Intervention(s): RN made aware Home Living/Prior Functioning Home Living Available Help at Discharge: Family, Available 24 hours/day Type of Home: Mobile home Home Access: Stairs to enter Technical brewer of Steps: 5 Entrance Stairs-Rails: Left Home Layout: One level Bathroom Shower/Tub: Gaffer, Chiropodist: Standard Bathroom Accessibility: Yes Additional Comments: has tub bench; bSC  Lives With: Son Prior Function Level of Independence: Requires assistive device for independence(3L O2) Comments: reading ADL ADL Eating: Set up Grooming: Setup Upper Body Bathing: Setup Where Assessed-Upper Body Bathing: Edge of bed Lower Body Bathing: Moderate  assistance Where Assessed-Lower Body Bathing: Edge of bed Upper Body Dressing: Setup Where Assessed-Upper Body Dressing: Edge of bed Lower Body Dressing: Minimal assistance Where Assessed-Lower Body Dressing: Edge of bed Vision Baseline Vision/History: Cataracts Patient Visual Report: (unable to see out of R eye) Vision Assessment?: Yes Eye Alignment: Impaired (comment)(R eye internally rotated) Alignment/Gaze Preference: Within Defined Limits Tracking/Visual Pursuits: Able to track stimulus in all quads without difficulty Saccades: Within functional limits Perception  [facility]   Praxis   [facility] Cognition Orientation Level: Person;Place;Situation Person: Oriented Place: Oriented Situation: Oriented Year: 2020 Month: February Day of Week: Correct Memory: Impaired Immediate Memory Recall: Sock;Blue;Bed Memory Recall: Blue Awareness: Impaired Problem Solving: Impaired Safety/Judgment: Impaired Sensation Sensation Light Touch: Appears Intact Coordination Gross Motor Movements are Fluid and Coordinated: No Fine Motor Movements are Fluid and Coordinated: No Coordination and Movement Description: slow and discoordinated with opposition Finger Nose Finger Test: [facility] Motor  Motor Motor - Discharge Observations: generalized weakness Mobility  Bed Mobility Bed Mobility: Rolling Right;Rolling Left;Supine to Sit;Sit to Supine Rolling Right: Minimal Assistance - Patient > 75% Rolling Left: Minimal Assistance - Patient > 75% Supine to Sit: Moderate Assistance - Patient 50-74% Sit to Supine: Contact Guard/Touching assist Transfers Sit to Stand: Moderate Assistance - Patient 50-74%  Trunk/Postural Assessment  Cervical Assessment Cervical Assessment: Exceptions to WFL(head forward) Thoracic Assessment Thoracic Assessment: Exceptions to WFL(rounded shoudlers) Lumbar Assessment Lumbar Assessment: Exceptions to WFL(posterior pelvic tilt) Postural Control Postural Control: Deficits on  evaluation(insufficient)  Balance Balance Balance Assessed: Yes Static Sitting Balance Static Sitting - Level of Assistance: 6: Modified independent (Device/Increase time) Dynamic Sitting Balance Dynamic Sitting - Level of Assistance: 5: Stand by assistance Static Standing Balance Static Standing - Level of Assistance: 4: Min assist Dynamic Standing Balance Dynamic Standing - Level of Assistance: 3: Mod assist Extremity/Trunk Assessment RUE Assessment RUE Assessment: Exceptions to [facility] General Strength Comments: generalized weakness 0-90* in shoulders LUE Assessment LUE Assessment: Exceptions to [facility] General Strength Comments: generalized weakness 0-90* in shoulders     Refer to Care Plan for Long Term Goals  Recommendations for other services: Therapeutic Recreation  Pet therapy, Stress management and Outing/community reintegration   Discharge Criteria: Patient will be discharged from OT if patient refuses treatment 3 consecutive times without medical reason, if treatment goals not met, if there is a change in medical status, if patient makes no progress towards goals or if patient is discharged from hospital.  The above assessment, treatment plan, treatment alternatives and goals were discussed and mutually agreed upon: by patient  Tonny Branch 08/13/2018, 7:39 AM

## 2018-08-13 NOTE — Progress Notes (Signed)
Social Work  Social Work Assessment and Plan  Patient Details  Name: Kelsey Jensen MRN: [id number] Date of Birth: Feb 17, 1951  Today's Date: 08/13/2018  Problem List:  Patient Active Problem List   Diagnosis Date Noted  . Supplemental oxygen dependent   . Essential hypertension   . Aplastic anemia (H[facility])   . Acute pain of left knee   . Pulmonary embolism (H[facility])   . Debility 08/12/2018  . Acute pulmonary embolism (H[facility])   . Acute on chronic respiratory failure with hypoxia (H[facility]) 06/18/2018  . Severe sepsis with septic shock (H[facility]) 06/18/2018  . Chronic congestive heart failure (H[facility]) 06/18/2018  . 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 AF (paroxysmal atrial fibrillation) (H[facility]) 06/18/2018   Past Medical History:  Past Medical History:  Diagnosis Date  . Acute pulmonary embolism (H[facility])   . Anemia   . Arthritis   . CHF (congestive heart failure) ([facility])   . COPD (chronic obstructive pulmonary disease) (H[facility])   . GERD (gastroesophageal reflux disease)   . Hypertension    Past Surgical History:  Past Surgical History:  Procedure Laterality Date  . APPENDECTOMY    . CHOLECYSTECTOMY    . EYE SURGERY Left   . TRACHEOSTOMY TUBE PLACEMENT N/A 06/28/2018   Procedure: TRACHEOSTOMY;  Surgeon: Drema Halon, MD;  Location: [facility];  Service: ENT;  Laterality: N/A;   Social History:  reports that she quit smoking about 7 years ago. Her smoking use included cigarettes. She smoked 0.50 packs per day. She has never used smokeless tobacco. She reports that she does not drink alcohol or use drugs.  Family / Support Systems Marital Status: Divorced Patient Roles: Parent, Other (Comment)(grandparent) Children: son, Ivey Heraty @ [phone]; son, Gene Vialpando; daughter, Thamas Jaegers and granddaughter, Barnett Abu @ [phone].  Son, Ivar Drape lives with pt and all other live very close by. Anticipated Caregiver: son and granddaughter Ability/Limitations  of Caregiver: Worthy Rancher in school at 3 pm; will be there 7 a until 3 pm; Ivar Drape otherwise Caregiver Availability: 24/7 Family Dynamics: Pt describes family as very supportive and notes granddaughter in nursing school "... 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   Abuse/Neglect Abuse/Neglect Assessment Can Be Completed: Yes Physical Abuse: Denies Verbal Abuse: Denies Sexual Abuse: Denies Exploitation of patient/patient's resources: Denies Self-Neglect: Denies  Emotional Status Pt's affect, behavior and adjustment status: Pt sitting up in w/c and very pleasant, talkative.  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 when I feel like I'm getting sick I start to worry and that doesn't help things."  She also notes that she is very frustrated with her current state of debility and weakness and "i'm just ready to be able to get up and do like I could before."  Pt may benefit from neuropsychology support as well while here. Recent Psychosocial Issues: None Psychiatric History: None Substance Abuse History: None  Patient / Family Perceptions, Expectations & Goals Pt/Family understanding of illness & functional limitations: Pt and family with good understanding of her medical issues and current functional limitations/ need for CIR. Premorbid pt/family roles/activities: Pt was independent overall;  chronic O2 use. Anticipated changes in roles/activities/participation: Per goals of supervision, family will need  to increase support somewhat Pt/family expectations/goals: "I just want to be able to get up and walk like I used to."  [employer]: None Premorbid Home  Care/DME Agencies: Other (Comment)([facility] for O2;  [facility] for [facility] services) Transportation available at discharge: yes Resource referrals recommended: Neuropsychology  Discharge Planning Living Arrangements: Children, Other relatives Support Systems: Children Type of Residence: Private residence Insurance Resources: [employer], Media planner (specify)([employer]) Financial Resources: Restaurant manager, fast food Screen Referred: No Living Expenses: Own Money Management: Patient Does the patient have any problems obtaining your medications?: No Home Management: pt and son;  pt notes son does most of the meal prep Patient/Family Preliminary Plans: Pt to d/c home with son, Ivar Drape, and other local family members able to provide 24/7 support. Social Work Anticipated Follow Up Needs: HH/OP Expected length of stay: 12-14 days  Clinical Impression Frail appearing woman who has had a lengthy hospitalization due to resp failure and now very debilitated.  She is very motivated for therapy and eager to regain her strength and independence.  Good family support.  Will follow for support and d/c planning needs.  Tim Wilhide 08/13/2018, 1:36 PM

## 2018-08-13 NOTE — Progress Notes (Signed)
Chaplain provided Advanced Directive Material for patient and shared to have chaplain paged when she has it ready -save last page to sign in front of witnesses.  Have chaplain paged when ready to complete it.Phebe Colla, Chaplain   08/13/18 1100  Clinical Encounter Type  Visited With Patient  Visit Type  (AD material)  Referral From Physician  Consult/Referral To Chaplain  Spiritual Encounters  Spiritual Needs Prayer  Stress Factors  Patient Stress Factors Other (Comment) (commented its hard when you cant do what you used to do)  Family Stress Factors Not reviewed

## 2018-08-14 ENCOUNTER — Inpatient Hospital Stay (HOSPITAL_COMMUNITY): Payer: Medicare Other | Admitting: Occupational Therapy

## 2018-08-14 ENCOUNTER — Inpatient Hospital Stay (HOSPITAL_COMMUNITY): Payer: Medicare Other | Admitting: Physical Therapy

## 2018-08-14 LAB — GLUCOSE, CAPILLARY
GLUCOSE-CAPILLARY: 114 mg/dL — AB (ref 70–99)
Glucose-Capillary: 137 mg/dL — ABNORMAL HIGH (ref 70–99)
Glucose-Capillary: 230 mg/dL — ABNORMAL HIGH (ref 70–99)
Glucose-Capillary: 138 mg/dL — ABNORMAL HIGH (ref 70–99)

## 2018-08-14 LAB — PROTIME-INR
INR: 3 — ABNORMAL HIGH (ref 0.8–1.2)
Prothrombin Time: 30.9 seconds — ABNORMAL HIGH (ref 11.4–15.2)

## 2018-08-14 MED ORDER — WARFARIN SODIUM 2 MG PO TABS
4.0000 mg | ORAL_TABLET | Freq: Once | ORAL | Status: AC
  Administered 2018-08-14: 4 mg via ORAL
  Filled 2018-08-14: qty 2

## 2018-08-14 MED ORDER — AMLODIPINE BESYLATE 2.5 MG PO TABS
2.5000 mg | ORAL_TABLET | Freq: Every day | ORAL | Status: DC
  Administered 2018-08-15 – 2018-08-19 (×5): 2.5 mg via ORAL
  Filled 2018-08-14 (×5): qty 1

## 2018-08-14 MED ORDER — METOPROLOL TARTRATE 25 MG PO TABS
25.0000 mg | ORAL_TABLET | Freq: Once | ORAL | Status: AC
  Administered 2018-08-14: 25 mg via ORAL
  Filled 2018-08-14: qty 1

## 2018-08-14 NOTE — Plan of Care (Signed)
  Problem: Consults Goal: RH GENERAL PATIENT EDUCATION Description See Patient Education module for education specifics. Outcome: Progressing   Problem: RH BOWEL ELIMINATION Goal: RH STG MANAGE BOWEL WITH ASSISTANCE Description STG Manage Bowel with min Assistance.  Outcome: Progressing   Problem: RH BLADDER ELIMINATION Goal: RH STG MANAGE BLADDER WITH ASSISTANCE Description STG Manage Bladder With min Assistance  Outcome: Progressing   Problem: RH SKIN INTEGRITY Goal: RH STG SKIN FREE OF INFECTION/BREAKDOWN Description Skin will be free from infection/breakdown with min assist  Outcome: Progressing Goal: RH STG MAINTAIN SKIN INTEGRITY WITH ASSISTANCE Description STG Maintain Skin Integrity With min  Assistance.  Outcome: Progressing   Problem: RH SAFETY Goal: RH STG ADHERE TO SAFETY PRECAUTIONS W/ASSISTANCE/DEVICE Description STG Adhere to Safety Precautions With min  Assistance/Device.  Outcome: Progressing   Problem: RH PAIN MANAGEMENT Goal: RH STG PAIN MANAGED AT OR BELOW PT'S PAIN GOAL Description Pt will report pain and maintain pain at or below 3 with prn pain medications.  Outcome: Progressing   Problem: RH KNOWLEDGE DEFICIT GENERAL Goal: RH STG INCREASE KNOWLEDGE OF SELF CARE AFTER HOSPITALIZATION Description Pt will be able to verbalize and care for self mod I prior to discharge  Outcome: Progressing

## 2018-08-14 NOTE — Progress Notes (Signed)
ANTICOAGULATION CONSULT NOTE - Follow Up Consult  Pharmacy Consult for Warfarin Indication: pulmonary embolus  Allergies  Allergen Reactions  . Sulfa Antibiotics Nausea And Vomiting    Patient Measurements: Height: 5\' 4"  (162.6 cm) Weight: 204 lb 12.9 oz (92.9 kg) IBW/kg (Calculated) : 54.7  Vital Signs: Temp: 97.1 F (36.2 C) (02/29 0459) BP: 109/88 (02/29 0930) Pulse Rate: 100 (02/29 0930)  Labs: Recent Labs    08/12/18 0459 08/13/18 0715 08/14/18 0710  HGB  --  13.3  --   HCT  --  43.5  --   PLT  --  262  --   LABPROT 28.2* 29.5* 30.9*  INR 2.7* 2.9* 3.0*  CREATININE  --  1.02*  --     Estimated Creatinine Clearance: 56.7 mL/min (A) (by C-G formula based on SCr of 1.02 mg/dL (H)).  Assessment: 68 year old female transferred from [facility] Started on warfarin 08/03/18 for PE  Previous dosing regimen as follows: 2/21 INR 1.10 -> 7.5 mg 2/22 INR 1.2 -> 7.5 mg 2/23 INR 1.52 - > 10 mg 2/24 INR 1.89 -> 10 mg 2/25 INR 2.8 -> 5 mg 2/26 INR 2.6 -> 5 mg 2/27 INR 2.7 2/28 INR 2.9 2/29 INR 3.0  Goal of Therapy:  INR 2-3 Monitor platelets by anticoagulation protocol: Yes   Plan:  Warfarin 4 mg po x 1 dose tonight at 1800 pm Daily INR  Danae Orleans, PharmD PGY1 Pharmacy Resident Phone [phone] 08/14/2018       10:18 AM

## 2018-08-14 NOTE — Progress Notes (Signed)
Physical Therapy Session Note  Patient Details  Name: Kelsey Jensen MRN: [id number] Date of Birth: Sep 14, 1950  Today's Date: 08/14/2018 PT Individual Time: 0800-0900 PT Individual Time Calculation (min): 60 min   Short Term Goals: Week 1:  PT Short Term Goal 1 (Week 1): Pt will perform sit<>stand with Supervision assist  PT Short Term Goal 2 (Week 1): Pt will transfers to [facility] with CGA consistently  PT Short Term Goal 3 (Week 1): Pt will propell WC >150 ft with supervision assist  PT Short Term Goal 4 (Week 1): Pt will ambulate 25f with min assist and LRAD   Skilled Therapeutic Interventions/Progress Updates:   Pt received sitting in WC and agreeable to PT  Dressing upper and lower body with set up assist for clothing management and min-CGA for sit<>stand with RW.   PT assessed Orthostatic Vital signs.  Sitting. 104/80 Standing 122/100   standing 1 min 140/110  Sitting 107/81. ( RN made aware)  Mild dizziness in standing initially  WC mobility to rehab gym x 1258fwith supervision assist from PT min cues for improved length of wheel roll to conserve overall energy.   Orthostatic vital signs.  Sitting. 104/85.  Standing 119/92.  Standing 2 min 142/99.  Asymptomatic.   PT instructed pt in BLE therex with level 1 tband.  LAQ, hip flexion, hip abduction, ankle PF, HS curls, all completed x 12 BLE with cues for improved eccentric control and full ROM.   [facility] to [facility] reach and all needs met.     Therapy Documentation Precautions:  Precautions Precautions: Fall Restrictions Weight Bearing Restrictions: No General:   Vital Signs:  Pain:   Mobility:   Locomotion :    Trunk/Postural Assessment :    Balance:   Exercises:   Other Treatments:      Therapy/Group: Individual Therapy  AuLorie Phenix/29/2020, 9:01 AM

## 2018-08-14 NOTE — Progress Notes (Signed)
PHYSICAL MEDICINE & REHABILITATION PROGRESS NOTE  Subjective/Complaints:  Breathing ok per pt, has not used inhaler this am  ROS: + Left knee pain.  Denies CP, shortness of breath, nausea, vomiting, diarrhea.  Objective: Vital Signs: Blood pressure 91/69, pulse 74, temperature (!) 97.1 F (36.2 C), resp. rate 18, height 5\' 4"  (1.626 m), weight 92.9 kg, SpO2 98 %. No results found. Recent Labs    08/13/18 0715  WBC 7.8  HGB 13.3  HCT 43.5  PLT 262   Recent Labs    08/12/18 1030 08/13/18 0715  NA  --  139  K 4.6 4.2  CL  --  100  CO2  --  28  GLUCOSE  --  83  BUN  --  43*  CREATININE  --  1.02*  CALCIUM  --  9.1    Physical Exam: BP 91/69   Pulse 74   Temp (!) 97.1 F (36.2 C)   Resp 18   Ht 5\' 4"  (1.626 m)   Wt 92.9 kg   SpO2 98%   BMI 35.16 kg/m  Constitutional: No distress . Vital signs reviewed. HENT: Normocephalic.  Atraumatic. Eyes: EOMI. No discharge. Cardiovascular: RRR. No JVD. Respiratory: CTA Bilaterally. Normal effort.  + . GI: BS +. Non-distended. Musc: Mild tenderness left knee. Neuro: Alert and oriented. Motor: Bilateral upper extremities: 4+/5 proximal distal Bilateral lower extremities: Hip flexion 3+/5, knee extension 4 medicine/5, ankle dorsiflexion 4+/5 Skin:Trach site healing Psych: Normal mood and behavior.   Assessment/Plan: 1. Functional deficits secondary to debility which require 3+ hours per day of interdisciplinary therapy in a comprehensive inpatient rehab setting.  Physiatrist is providing close team supervision and 24 hour management of active medical problems listed below.  Physiatrist and rehab team continue to assess barriers to discharge/monitor patient progress toward functional and medical goals  Care Tool:  Bathing    Body parts bathed by patient: Right arm, Left arm, Chest, Abdomen, Front perineal area, Right upper leg, Left upper leg, Right lower leg, Left lower leg, Face         Bathing  assist Assist Level: Minimal Assistance - Patient > 75%(MOD A sit to stand)     Upper Body Dressing/Undressing Upper body dressing   What is the patient wearing?: Pull over shirt    Upper body assist Assist Level: Supervision/Verbal cueing    Lower Body Dressing/Undressing Lower body dressing      What is the patient wearing?: Underwear/pull up, Pants     Lower body assist Assist for lower body dressing: Minimal Assistance - Patient > 75%     Toileting Toileting Toileting Activity did not occur (Clothing management and hygiene only): Safety/medical concerns  Toileting assist Assist for toileting: Moderate Assistance - Patient 50 - 74%     Transfers Chair/bed transfer  Transfers assist  Chair/bed transfer activity did not occur: Safety/medical concerns  Chair/bed transfer assist level: Moderate Assistance - Patient 50 - 74%     Locomotion Ambulation   Ambulation assist      Assist level: Moderate Assistance - Patient 50 - 74% Assistive device: Hand held assist Max distance: 5   Walk 10 feet activity   Assist  Walk 10 feet activity did not occur: Safety/medical concerns        Walk 50 feet activity   Assist Walk 50 feet with 2 turns activity did not occur: Safety/medical concerns         Walk 150 feet activity   Assist Walk 150  feet activity did not occur: Safety/medical concerns         Walk 10 feet on uneven surface  activity   Assist Walk 10 feet on uneven surfaces activity did not occur: Safety/medical concerns         Wheelchair     Assist   Type of Wheelchair: Manual    Wheelchair assist level: Minimal Assistance - Patient > 75% Max wheelchair distance: 182ft    Wheelchair 50 feet with 2 turns activity    Assist        Assist Level: Minimal Assistance - Patient > 75%   Wheelchair 150 feet activity     Assist Wheelchair 150 feet activity did not occur: Safety/medical concerns          Medical  Problem List and Plan: 1.Debilitysecondary to acute on chronic respiratory failure. Patient not on prednisone prior to admission. Continue chronic oxygen 3 L as prior to admission. Patient does not have a routine pulmonologist and will need a referral   CIR PT, OT  Notes reviewed, labs reviewed  Taper steroids 2. Antithrombotics: -DVT/anticoagulation:Coumadin for pulmonary emboli -antiplatelet therapy:ASA 81mg  qd 3. Pain Management:Voltaren as directed.Tylenol as needed  Robaxin as needed started on 2/28- no pain c/os 2/29 4. Mood:Provide emotional support -antipsychotic agents: Clonazepam 0.5 mg 3 times a day 5. Neuropsych: This patientiscapable of making decisions on herown behalf. 6. Skin/Wound Care:Routine skin checks 7. Fluids/Electrolytes/Nutrition:Routine in and out's   BMP elevated BUN vs 2/17 creat stable enc fluid 8. 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 Dillard Cannon. Decannulated 9. Chronic aplastic anemia. Patient multiple transfusions in the past.   Hemoglobin 13.8 on 2/26  CBC normal 2/28 10. Diastolic congestive heart failure. Lasix 40 mg daily. Monitor for any signs of fluid overload Filed Weights   08/12/18 1400  Weight: 92.9 kg  11. Hypertension. Amlodipine 5 mg daily, metoprolol 75 mg every 8 hours.   Monitor with increased mobility 12. Constipation. Laxative assistance  LOS: 2 days A FACE TO FACE EVALUATION WAS PERFORMED  Erick Colace 08/14/2018, 7:53 AM

## 2018-08-14 NOTE — Progress Notes (Signed)
Physical Therapy Session Note  Patient Details  Name: Kelsey Jensen MRN: [id number] Date of Birth: [birth year]  Today's Date: 08/14/2018 PT Individual Time: 1405-1507 PT Individual Time Calculation (min): 62 min   Short Term Goals: Week 1:  PT Short Term Goal 1 (Week 1): Pt will perform sit<>stand with Supervision assist  PT Short Term Goal 2 (Week 1): Pt will transfers to [facility] with CGA consistently  PT Short Term Goal 3 (Week 1): Pt will propell WC >150 ft with supervision assist  PT Short Term Goal 4 (Week 1): Pt will ambulate 32f with min assist and LRAD   Skilled Therapeutic Interventions/Progress Updates: Pt presented at toilet with nsg completing peri-care and agreeable to therapy. Pt transferred to w/c via [place]. Once in w/c pt transported to rehab gym for energy conservation. Participated in ambulation 116fx3 CGA requiring minA for STS and cues for hand placement. SpO2 remained >90% with HR 115-120 after activity. Pt then participated in seated LE therex as follows All performed 2 x 10 bilaterally LAQ Ankle pumps Hamstring pulls with level 1 resistance band Ball adductor squeezes Hip flexion Hip ER with level 1 resistance band  After therapeutic rest pt agreeable to additional ambulation. Pt ambulated 2513f 1 with CGA and cues for PLB. Max HR 126 after ambulation. Pt returned to w/c stand pivot and transported back to room. Pt remained in w/c at end of session with belt alarm on, call bell within reach and needs met.      Therapy Documentation Precautions:  Precautions Precautions: Fall Restrictions Weight Bearing Restrictions: No General:   Vital Signs: Therapy Vitals Temp: 98 F (36.7 C) Pulse Rate: (!) 105 Resp: (!) 22 BP: 95/67 Patient Position (if appropriate): Sitting Oxygen Therapy SpO2: 93 % O2 Device: Nasal Cannula O2 Flow Rate (L/min): 3 L/min Pain: Pain Assessment Pain Scale: Faces Pain Score: 5  Faces Pain Scale: Hurts a little bit Pain Type:  Acute pain Pain Location: Foot Pain Orientation: Right;Left Pain Descriptors / Indicators: Aching Pain Onset: With Activity Pain Intervention(s): Repositioned Therapy/Group: Individual Therapy  Twan Harkin  Unique Sillas, PTA  08/14/2018, 4:09 PM

## 2018-08-14 NOTE — Progress Notes (Signed)
Occupational Therapy Session Note  Patient Details  Name: SHERL NEWBURG MRN: [id number] Date of Birth: March 18, 1951  Today's Date: 08/14/2018 OT Individual Time: 6789-3810 OT Individual Time Calculation (min): 68 min    Short Term Goals: Week 1:  OT Short Term Goal 1 (Week 1): Pt will stand at sink for oral care to demo improve endurance OT Short Term Goal 2 (Week 1): Pt will don B socks wiht S and AE PRN OT Short Term Goal 3 (Week 1): Pt will transfer to toilet wiht MIN A OT Short Term Goal 4 (Week 1): Pt will stand for >3 min and MIN A to demo improved functional standing tolerance required for ADLs  Skilled Therapeutic Interventions/Progress Updates:   Pt completed simulated walk-in shower transfers with use of the tub bench and RW with mod assist to start session.  Pt reports using the tub bench PTA but she does not have a hand held shower.  She discussed that her son is planning to get her one.  Next had pt transfer back to the wheelchair and transfer over to the therapy gym.  Had her transfer to the therapy mat where she worked on sit to stand transitions with mod assist and standing endurance while engaged in UE use.  In standing, pt demonstrates forward trunk flexion secondary to pre-existing lower back pain.  She was able to stand for intervals of 3-4 mins before needing a rest break secondary to fatigue with dyspnea 3/4.  Oxygen sats remained 96% or above on 3 liters however.  Next had pt complete BUE strengthening exercises.  She completed 4 sets of 5 reps for bilateral shoulder flexion using dowel rod.  Min assist needed to complete reps secondary to shoulder limitations, more on the right than left.  Had her utilize level 1 therapy band for 2 sets of 10 reps for shoulder extension/row.  Had her also complete 1 set each of elbow flexion and elbow extension for 1 reps.  Finished session with transfer back to the wheelchair with mod assist and then therapist transported her back to the  room.  Pt left in wheelchair in preparation for lunch with call button and phone in reach and safety alarm belt in place.     Therapy Documentation Precautions:  Precautions Precautions: Fall Restrictions Weight Bearing Restrictions: No   Vital Signs: Therapy Vitals Pulse Rate: 80 BP: 109/88 Patient Position (if appropriate): Sitting Oxygen Therapy SpO2: 96 % O2 Device: Nasal Cannula O2 Flow Rate (L/min): 3 L/min Pain: Pain Assessment Pain Scale: Faces Pain Score: 8  Faces Pain Scale: Hurts a little bit Pain Type: Acute pain Pain Location: Foot Pain Orientation: Right;Left Pain Descriptors / Indicators: Aching Pain Onset: With Activity Pain Intervention(s): Repositioned ADL: See Care Tool Section of chart for some ADL details  Therapy/Group: Individual Therapy  Arlene Genova  OTR/L 08/14/2018, 12:48 PM

## 2018-08-15 LAB — GLUCOSE, CAPILLARY
Glucose-Capillary: 94 mg/dL (ref 70–99)
Glucose-Capillary: 121 mg/dL — ABNORMAL HIGH (ref 70–99)
Glucose-Capillary: 122 mg/dL — ABNORMAL HIGH (ref 70–99)
Glucose-Capillary: 143 mg/dL — ABNORMAL HIGH (ref 70–99)

## 2018-08-15 LAB — PROTIME-INR
INR: 3.6 — ABNORMAL HIGH (ref 0.8–1.2)
Prothrombin Time: 35.3 seconds — ABNORMAL HIGH (ref 11.4–15.2)

## 2018-08-15 MED ORDER — PREDNISONE 10 MG PO TABS
15.0000 mg | ORAL_TABLET | Freq: Every day | ORAL | Status: DC
  Administered 2018-08-15 – 2018-08-17 (×3): 15 mg via ORAL
  Filled 2018-08-15 (×3): qty 2

## 2018-08-15 NOTE — Progress Notes (Signed)
ANTICOAGULATION CONSULT NOTE - Follow Up Consult  Pharmacy Consult for Warfarin Indication: pulmonary embolus  Allergies  Allergen Reactions  . Sulfa Antibiotics Nausea And Vomiting    Patient Measurements: Height: 5\' 4"  (162.6 cm) Weight: 205 lb 0.4 oz (93 kg) IBW/kg (Calculated) : 54.7  Vital Signs: Temp: 97.9 F (36.6 C) (03/01 0529) Temp Source: Oral (03/01 0529) BP: 128/87 (03/01 0743) Pulse Rate: 100 (03/01 0743)  Labs: Recent Labs    08/13/18 0715 08/14/18 0710 08/15/18 0552  HGB 13.3  --   --   HCT 43.5  --   --   PLT 262  --   --   LABPROT 29.5* 30.9* 35.3*  INR 2.9* 3.0* 3.6*  CREATININE 1.02*  --   --     Estimated Creatinine Clearance: 56.7 mL/min (A) (by C-G formula based on SCr of 1.02 mg/dL (H)).  Assessment: 68 year old female transferred from [facility] Started on warfarin 08/03/18 for PE  Previous dosing regimen as follows: 2/21 INR 1.10 -> 7.5 mg 2/22 INR 1.2 -> 7.5 mg 2/23 INR 1.52 - > 10 mg 2/24 INR 1.89 -> 10 mg 2/25 INR 2.8 -> 5 mg 2/26 INR 2.6 -> 5 mg 2/27 INR 2.7 2/28 INR 2.9 2/29 INR 3.0 3/1 INR 3.6  Goal of Therapy:  INR 2-3 Monitor platelets by anticoagulation protocol: Yes   Plan:  Hold warfarin today Daily INR  Danae Orleans, PharmD PGY1 Pharmacy Resident Phone [phone] 08/15/2018       8:34 AM

## 2018-08-15 NOTE — Plan of Care (Signed)
  Problem: Consults Goal: RH GENERAL PATIENT EDUCATION Description See Patient Education module for education specifics. Outcome: Progressing   Problem: RH BOWEL ELIMINATION Goal: RH STG MANAGE BOWEL WITH ASSISTANCE Description STG Manage Bowel with min Assistance.  Outcome: Progressing   Problem: RH BLADDER ELIMINATION Goal: RH STG MANAGE BLADDER WITH ASSISTANCE Description STG Manage Bladder With min Assistance  Outcome: Progressing   Problem: RH SKIN INTEGRITY Goal: RH STG SKIN FREE OF INFECTION/BREAKDOWN Description Skin will be free from infection/breakdown with min assist  Outcome: Progressing Goal: RH STG MAINTAIN SKIN INTEGRITY WITH ASSISTANCE Description STG Maintain Skin Integrity With min  Assistance.  Outcome: Progressing   Problem: RH SAFETY Goal: RH STG ADHERE TO SAFETY PRECAUTIONS W/ASSISTANCE/DEVICE Description STG Adhere to Safety Precautions With min  Assistance/Device.  Outcome: Progressing   Problem: RH PAIN MANAGEMENT Goal: RH STG PAIN MANAGED AT OR BELOW PT'S PAIN GOAL Description Pt will report pain and maintain pain at or below 3 with prn pain medications.  Outcome: Progressing   Problem: RH KNOWLEDGE DEFICIT GENERAL Goal: RH STG INCREASE KNOWLEDGE OF SELF CARE AFTER HOSPITALIZATION Description Pt will be able to verbalize and care for self mod I prior to discharge  Outcome: Progressing   

## 2018-08-15 NOTE — Progress Notes (Addendum)
[place] PHYSICAL MEDICINE & REHABILITATION PROGRESS NOTE  Subjective/Complaints: Breathing ok Had good BM, Appetite good, bladder working well  ROS no CP, SOB, N/V/D   ROS-Left knee pain.  Denies CP, shortness of breath, nausea, vomiting, diarrhea.  Objective: Vital Signs: Blood pressure 106/71, pulse 73, temperature 97.9 F (36.6 C), temperature source Oral, resp. rate 20, height 5\' 4"  (1.626 m), weight 93 kg, SpO2 99 %. No results found. Recent Labs    08/13/18 0715  WBC 7.8  HGB 13.3  HCT 43.5  PLT 262   Recent Labs    08/12/18 1030 08/13/18 0715  NA  --  139  K 4.6 4.2  CL  --  100  CO2  --  28  GLUCOSE  --  83  BUN  --  43*  CREATININE  --  1.02*  CALCIUM  --  9.1    Physical Exam: BP 106/71 (BP Location: Right Arm)   Pulse 73   Temp 97.9 F (36.6 C) (Oral)   Resp 20   Ht 5\' 4"  (1.626 m)   Wt 93 kg   SpO2 99%   BMI 35.19 kg/m  Constitutional: No distress . Vital signs reviewed. HENT: Normocephalic.  Atraumatic. Eyes: EOMI. No discharge. Cardiovascular: RRR. No JVD. Respiratory: CTA Bilaterally. Normal effort.  + [place]. GI: BS +. Non-distended. Musc: Mild tenderness left knee. Neuro: Alert and oriented. Motor: Bilateral upper extremities: 4+/5 proximal distal Bilateral lower extremities: Hip flexion 3+/5, knee extension 4 medicine/5, ankle dorsiflexion 4+/5 Skin:Trach site healing Psych: Normal mood and behavior.   Assessment/Plan: 1. Functional deficits secondary to debility which require 3+ hours per day of interdisciplinary therapy in a comprehensive inpatient rehab setting.  Physiatrist is providing close team supervision and 24 hour management of active medical problems listed below.  Physiatrist and rehab team continue to assess barriers to discharge/monitor patient progress toward functional and medical goals  Care Tool:  Bathing    Body parts bathed by patient: Right arm, Left arm, Chest, Abdomen, Front perineal area, Right upper  leg, Left upper leg, Right lower leg, Left lower leg, Face         Bathing assist Assist Level: Minimal Assistance - Patient > 75%     Upper Body Dressing/Undressing Upper body dressing   What is the patient wearing?: Pull over shirt    Upper body assist Assist Level: Supervision/Verbal cueing    Lower Body Dressing/Undressing Lower body dressing      What is the patient wearing?: Underwear/pull up, Pants     Lower body assist Assist for lower body dressing: Minimal Assistance - Patient > 75%     Toileting Toileting Toileting Activity did not occur (Clothing management and hygiene only): Safety/medical concerns  Toileting assist Assist for toileting: Moderate Assistance - Patient 50 - 74%     Transfers Chair/bed transfer  Transfers assist  Chair/bed transfer activity did not occur: Safety/medical concerns  Chair/bed transfer assist level: Moderate Assistance - Patient 50 - 74%     Locomotion Ambulation   Ambulation assist      Assist level: Contact Guard/Touching assist Assistive device: Walker-rolling Max distance: 25   Walk 10 feet activity   Assist  Walk 10 feet activity did not occur: Safety/medical concerns  Assist level: Contact Guard/Touching assist     Walk 50 feet activity   Assist Walk 50 feet with 2 turns activity did not occur: Safety/medical concerns         Walk 150 feet activity  Assist Walk 150 feet activity did not occur: Safety/medical concerns         Walk 10 feet on uneven surface  activity   Assist Walk 10 feet on uneven surfaces activity did not occur: Safety/medical concerns         Wheelchair     Assist   Type of Wheelchair: Manual    Wheelchair assist level: Supervision/Verbal cueing Max wheelchair distance: 130    Wheelchair 50 feet with 2 turns activity    Assist        Assist Level: Supervision/Verbal cueing   Wheelchair 150 feet activity     Assist Wheelchair 150 feet  activity did not occur: Safety/medical concerns          Medical Problem List and Plan: 1.Debilitysecondary to acute on chronic respiratory failure. Patient not on prednisone prior to admission. Continue chronic oxygen 3 L as prior to admission. Patient does not have a routine pulmonologist and will need a referral   CIR PT, OT  Notes reviewed, labs reviewed  Taper steroids reuce to 15mg  today 3/1 2. Antithrombotics: -DVT/anticoagulation:Coumadin for pulmonary emboli -antiplatelet therapy:ASA 81mg  qd 3. Pain Management:Voltaren as directed.Tylenol as needed  Robaxin as needed started on 2/28- no pain c/os 2/29 4. Mood:Provide emotional support -antipsychotic agents: Clonazepam 0.5 mg 3 times a day 5. Neuropsych: This patientiscapable of making decisions on herown behalf. 6. Skin/Wound Care:Routine skin checks 7. Fluids/Electrolytes/Nutrition:Routine in and out's   BMP elevated BUN vs 2/17 creat stable enc fluid 8. 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 Dillard Cannon. Decannulated 9. Chronic aplastic anemia. Patient multiple transfusions in the past.   Hemoglobin 13.8 on 2/26  CBC normal 2/28 10. Diastolic congestive heart failure. Lasix 40 mg daily. Monitor for any signs of fluid overload Filed Weights   08/12/18 1400 08/15/18 0529  Weight: 92.9 kg 93 kg  11. Hypertension   Vitals:   08/14/18 2135 08/15/18 0529  BP: 99/73 106/71  Pulse: 72 73  Resp:  20  Temp:  97.9 F (36.6 C)  SpO2:  99%   reduce amlodipine to 2.5  cont current dose metoprolol 75mg  TID with parameters 12. Constipation. Laxative assistance  LOS: 3 days A FACE TO FACE EVALUATION WAS PERFORMED  Erick Colace 08/15/2018, 7:39 AM

## 2018-08-16 ENCOUNTER — Inpatient Hospital Stay (HOSPITAL_COMMUNITY): Payer: Medicare Other

## 2018-08-16 ENCOUNTER — Inpatient Hospital Stay (HOSPITAL_COMMUNITY): Payer: Medicare Other | Admitting: Physical Therapy

## 2018-08-16 DIAGNOSIS — I5032 Chronic diastolic (congestive) heart failure: Secondary | ICD-10-CM

## 2018-08-16 DIAGNOSIS — T380X5A Adverse effect of glucocorticoids and synthetic analogues, initial encounter: Secondary | ICD-10-CM

## 2018-08-16 DIAGNOSIS — I10 Essential (primary) hypertension: Secondary | ICD-10-CM

## 2018-08-16 DIAGNOSIS — H938X3 Other specified disorders of ear, bilateral: Secondary | ICD-10-CM

## 2018-08-16 DIAGNOSIS — R739 Hyperglycemia, unspecified: Secondary | ICD-10-CM

## 2018-08-16 LAB — PROTIME-INR
INR: 2.6 — ABNORMAL HIGH (ref 0.8–1.2)
Prothrombin Time: 27.5 seconds — ABNORMAL HIGH (ref 11.4–15.2)

## 2018-08-16 LAB — GLUCOSE, CAPILLARY
GLUCOSE-CAPILLARY: 152 mg/dL — AB (ref 70–99)
Glucose-Capillary: 89 mg/dL (ref 70–99)
Glucose-Capillary: 128 mg/dL — ABNORMAL HIGH (ref 70–99)
Glucose-Capillary: 142 mg/dL — ABNORMAL HIGH (ref 70–99)

## 2018-08-16 MED ORDER — GUAIFENESIN ER 600 MG PO TB12
600.0000 mg | ORAL_TABLET | Freq: Two times a day (BID) | ORAL | Status: DC
  Administered 2018-08-16 – 2018-08-21 (×12): 600 mg via ORAL
  Filled 2018-08-16 (×13): qty 1

## 2018-08-16 MED ORDER — WARFARIN SODIUM 2 MG PO TABS
4.0000 mg | ORAL_TABLET | Freq: Once | ORAL | Status: AC
  Administered 2018-08-16: 4 mg via ORAL
  Filled 2018-08-16: qty 2

## 2018-08-16 NOTE — Care Management (Signed)
[facility] Individual Statement of Services  Patient Name:  Kelsey Jensen  Date:  08/16/2018  Welcome to the [facility].  Our goal is to provide you with an individualized program based on your diagnosis and situation, designed to meet your specific needs.  With this comprehensive rehabilitation program, you will be expected to participate in at least 3 hours of rehabilitation therapies Monday-Friday, with modified therapy programming on the weekends.  Your rehabilitation program will include the following services:  Physical Therapy (PT), Occupational Therapy (OT), 24 hour per day rehabilitation nursing, Therapeutic Recreaction (TR), Neuropsychology, Case Management (Social Worker), Rehabilitation Medicine, Nutrition Services and Pharmacy Services  Weekly team conferences will be held on Wednesdays to discuss your progress.  Your Social Worker will talk with you frequently to get your input and to update you on team discussions.  Team conferences with you and your family in attendance may also be held.  Expected length of stay: 12-14 days   Overall anticipated outcome: supervision  Depending on your progress and recovery, your program may change. Your Social Worker will coordinate services and will keep you informed of any changes. Your Social Worker's name and contact numbers are listed  below.  The following services may also be recommended but are not provided by the [facility]:   Driving Evaluations  Home Health Rehabiltiation Services  Outpatient Rehabilitation Services   Arrangements will be made to provide these services after discharge if needed.  Arrangements include referral to agencies that provide these services.  Your insurance has been verified to be:  Medicare; [employer] Your primary doctor is:  Systems developer  Pertinent information will be shared with your doctor and your insurance company.  Social  Worker:  [place], [place] [phone] or [phone]   Information discussed with and copy given to patient by: Amada Jupiter, 08/16/2018, 9:30 AM

## 2018-08-16 NOTE — Plan of Care (Signed)
1 

## 2018-08-16 NOTE — Progress Notes (Signed)
Physical Therapy Session Note  Patient Details  Name: Kelsey Jensen MRN: [id number] Date of Birth: 1950/08/16  Today's Date: 08/16/2018 PT Individual Time: 0921-1020 PT Individual Time Calculation (min): 59 min   Short Term Goals: Week 1:  PT Short Term Goal 1 (Week 1): Pt will perform sit<>stand with Supervision assist  PT Short Term Goal 2 (Week 1): Pt will transfers to [facility] with CGA consistently  PT Short Term Goal 3 (Week 1): Pt will propell WC >150 ft with supervision assist  PT Short Term Goal 4 (Week 1): Pt will ambulate 59f with min assist and LRAD   Skilled Therapeutic Interventions/Progress Updates: Pt presented in w/c agreeable to therapy. Pt stating x 2 bouts of diarrhea this am however willing to participate. BP assessed prior to therapy 92/74. Pt transported to day room and performed ambulatory transfer to NuStep for general conditioning L1 x 6 min. BP after NuStep 102/85. Pt returned to w/c in same manner as prior. Pt transported to rehab gym and participated in ambulation 385fx 2 with therapeutic rest between, max HR with ambulation 105, SpO2>95%. Pt then participated in STS initiatlly from elevated position then lowering height progressively. Pt required cues for increasing anterior wt shift and to rock to increase momentum. Pt progressed from minA to supervision from lowered mat. Performed ambulatory transfer back to w/c CGA and pt transported back to room. Pt remained in w/c at end of session and left with belt alarm on, call bell within reach and needs met.      Therapy Documentation Precautions:  Precautions Precautions: Fall Restrictions Weight Bearing Restrictions: No    Therapy/Group: Individual Therapy  Merced Hanners  Juanangel Soderholm, PTA  08/16/2018, 12:51 PM

## 2018-08-16 NOTE — Progress Notes (Signed)
Occupational Therapy Session Note  Patient Details  Name: Kelsey Jensen MRN: [id number] Date of Birth: [birth year]  Today's Date: 08/16/2018 OT Individual Time: 0830-0900 OT Individual Time Calculation (min): 30 min   Session 2:  OT Individual Time: 1115-1200 OT Individual Time Calculation (min): 45 min  Session 3:  OT Individual Time: 1445-1600 OT Individual Time Calculation (min): 75 min   Short Term Goals: Week 1:  OT Short Term Goal 1 (Week 1): Pt will stand at sink for oral care to demo improve endurance OT Short Term Goal 2 (Week 1): Pt will don B socks wiht S and AE PRN OT Short Term Goal 3 (Week 1): Pt will transfer to toilet wiht MIN A OT Short Term Goal 4 (Week 1): Pt will stand for >3 min and MIN A to demo improved functional standing tolerance required for ADLs  Skilled Therapeutic Interventions/Progress Updates:    Session 1: Pt received supine in bed with RN present administering medications. Pt c/o headache 8/10 with no request for intervention. Supine pt's BP 69/60. Pt transitioned to EOB and BP rose to 109/75. Pt sat EOB and threaded pants over B LE with (S). Pt  Stood with RW, cueing for UE placement, mod A to power up. Min A standing balance support as pt pulled up pants. Pt completed SPT with min A. Once sitting in w/c BP 106/74. Pt left sitting up in w/c with chair alarm belt fastened and all needs met.   Session 2: Pt received sitting up in w/c with no c/o pain. Pt completed 100 ft of w/c propulsion with min cueing for technique/UE placement. Pt required 3 rest breaks d/t fatigue. Pt was transported rest of way to [employer] room for time management. Pt stood with RW x3 trials, CGA first and last trial to stand and mod A for 2nd trial. In standing with unilateral RW support pt able to bimanually reach forward with 80% accuracy. Demo and instruction provided re reducing compensatory muscle activation during R UE forward flexion. Pain reported in R shoulder during  repeated trials, rest breaks provided for pain management. Pt was returned to room and  left sitting up in w/c with chair alarm belt fastened and all needs met.   Session 3: Pt recevied in w/c with no c/o pain. Pt completed functional mobility into bathroom with RW, CGA. Pt doffed pants and brief with CGA seated, assistance requried to doff socks and ted hose. Pt completed all bathing seated on bari BSC with (S).  Pt's BP 129/89 following shower and removal of ted hose. Pt reported fatigue and requested to transfer into w/c instead of walking out of bathroom. Fall risk reduction edu reinforced, including relationship between fatigue and fall risk. Pt sat at sink and completed grooming tasks with set up. Pt required  Min A to don brief in standing. CGA provided to don pajama pants. Pt required several rest breaks throughout session, but with no significant change in vital signs. Pt was transported to therapy gym and completed B UE integration task with a focus on [facility]. Pt was returned to room and left sitting up in w/c with chair alarm belt fastened and all needs met.   Therapy Documentation Precautions:  Precautions Precautions: Fall Restrictions Weight Bearing Restrictions: No     Therapy/Group: Individual Therapy  Curtis Sites 08/16/2018, 7:17 AM

## 2018-08-16 NOTE — Progress Notes (Signed)
[place] PHYSICAL MEDICINE & REHABILITATION PROGRESS NOTE  Subjective/Complaints: Patient seen sitting up this morning.  She states she slept well overnight.  She states that she has a cough with congestion that started over the weekend.  ROS + cough, congestion.  Denies CP, SOB, N/V/D  Objective: Vital Signs: Blood pressure 94/75, pulse 71, temperature 98 F (36.7 C), temperature source Oral, resp. rate 18, height 5\' 4"  (1.626 m), weight 93 kg, SpO2 99 %. No results found. No results for input(s): WBC, HGB, HCT, PLT in the last 72 hours. No results for input(s): NA, K, CL, CO2, GLUCOSE, BUN, CREATININE, CALCIUM in the last 72 hours.  Physical Exam: BP 94/75   Pulse 71   Temp 98 F (36.7 C) (Oral)   Resp 18   Ht 5\' 4"  (1.626 m)   Wt 93 kg   SpO2 99%   BMI 35.19 kg/m  Constitutional: No distress . Vital signs reviewed. HENT: Normocephalic.  Atraumatic. Eyes: EOMI. No discharge. Cardiovascular: RRR.  No JVD. Respiratory: +Upper airway sounds.  Normal effort.  + [place]. GI: BS +. Non-distended. Musc: Mild tenderness left knee. Neuro: Alert and oriented. Motor: Bilateral upper extremities: 4+/5 proximal distal Bilateral lower extremities: Hip flexion 3+/5, knee extension 4-/5, ankle dorsiflexion 4+/5, improving Skin:Trach site healing Psych: Normal mood and behavior.   Assessment/Plan: 1. Functional deficits secondary to debility which require 3+ hours per day of interdisciplinary therapy in a comprehensive inpatient rehab setting.  Physiatrist is providing close team supervision and 24 hour management of active medical problems listed below.  Physiatrist and rehab team continue to assess barriers to discharge/monitor patient progress toward functional and medical goals  Care Tool:  Bathing    Body parts bathed by patient: Right arm, Left arm, Chest, Abdomen, Front perineal area, Right upper leg, Left upper leg, Face   Body parts bathed by helper: Buttocks, Right lower  leg, Left lower leg     Bathing assist Assist Level: Minimal Assistance - Patient > 75%     Upper Body Dressing/Undressing Upper body dressing   What is the patient wearing?: Hospital gown only    Upper body assist Assist Level: Supervision/Verbal cueing    Lower Body Dressing/Undressing Lower body dressing      What is the patient wearing?: Incontinence brief, Pants     Lower body assist Assist for lower body dressing: Maximal Assistance - Patient 25 - 49%     Toileting Toileting Toileting Activity did not occur (Clothing management and hygiene only): Safety/medical concerns  Toileting assist Assist for toileting: Moderate Assistance - Patient 50 - 74%     Transfers Chair/bed transfer  Transfers assist  Chair/bed transfer activity did not occur: Safety/medical concerns  Chair/bed transfer assist level: Moderate Assistance - Patient 50 - 74%     Locomotion Ambulation   Ambulation assist      Assist level: Contact Guard/Touching assist Assistive device: Walker-rolling Max distance: 25   Walk 10 feet activity   Assist  Walk 10 feet activity did not occur: Safety/medical concerns  Assist level: Contact Guard/Touching assist     Walk 50 feet activity   Assist Walk 50 feet with 2 turns activity did not occur: Safety/medical concerns         Walk 150 feet activity   Assist Walk 150 feet activity did not occur: Safety/medical concerns         Walk 10 feet on uneven surface  activity   Assist Walk 10 feet on uneven surfaces activity  did not occur: Safety/medical concerns         Wheelchair     Assist   Type of Wheelchair: Manual    Wheelchair assist level: Supervision/Verbal cueing Max wheelchair distance: 130    Wheelchair 50 feet with 2 turns activity    Assist        Assist Level: Supervision/Verbal cueing   Wheelchair 150 feet activity     Assist Wheelchair 150 feet activity did not occur: Safety/medical  concerns          Medical Problem List and Plan: 1.Debilitysecondary to acute on chronic respiratory failure. Patient not on prednisone prior to admission. Continue chronic oxygen 3 L as prior to admission. Patient does not have a routine pulmonologist and will need a referral  Continue CIR  Taper steroids reduced to 15mg  on 3/1, will cont to taper  Weekend notes reviewed 2. Antithrombotics: -DVT/anticoagulation:Coumadin for pulmonary emboli  INR therapeutic on 3/2 -antiplatelet therapy:ASA 81mg  qd 3. Pain Management:Voltaren as directed.Tylenol as needed  Robaxin as needed started on 2/28 4. Mood:Provide emotional support -antipsychotic agents: Clonazepam 0.5 mg 3 times a day 5. Neuropsych: This patientiscapable of making decisions on herown behalf. 6. Skin/Wound Care:Routine skin checks 7. Fluids/Electrolytes/Nutrition:Routine in and out's   BMP elevated BUN vs 2/17 creat stable enc fluid 8. 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 Dillard Cannon. Decannulated 9. Chronic aplastic anemia. Patient multiple transfusions in the past.   Hemoglobin 13.3 on 2/28 10. Diastolic congestive heart failure. Lasix 40 mg daily. Monitor for any signs of fluid overload Filed Weights   08/12/18 1400 08/15/18 0529  Weight: 92.9 kg 93 kg   Stable on 3/2 11. Hypertension Vitals:   08/16/18 0313 08/16/18 0603  BP: 107/66 94/75  Pulse: 81 71  Resp: 18   Temp: 98 F (36.7 C)   SpO2: 98% 99%    Reduced amlodipine to 2.5 on 3/1  Cont current dose metoprolol 75mg  TID  12. Constipation. Laxative assistance 13.  Steroid-induced hyperglycemia  Improving on 3/2 14. Congestion  Mucinex started on 3/2  LOS: 4 days A FACE TO FACE EVALUATION WAS PERFORMED  Kelsey Jensen Karis Juba 08/16/2018, 9:00 AM

## 2018-08-17 ENCOUNTER — Inpatient Hospital Stay (HOSPITAL_COMMUNITY): Payer: Medicare Other | Admitting: Physical Therapy

## 2018-08-17 ENCOUNTER — Inpatient Hospital Stay (HOSPITAL_COMMUNITY): Payer: Medicare Other | Admitting: Occupational Therapy

## 2018-08-17 LAB — GLUCOSE, CAPILLARY
Glucose-Capillary: 91 mg/dL (ref 70–99)
Glucose-Capillary: 126 mg/dL — ABNORMAL HIGH (ref 70–99)
Glucose-Capillary: 141 mg/dL — ABNORMAL HIGH (ref 70–99)
Glucose-Capillary: 124 mg/dL — ABNORMAL HIGH (ref 70–99)

## 2018-08-17 LAB — PROTIME-INR
INR: 2.1 — ABNORMAL HIGH (ref 0.8–1.2)
Prothrombin Time: 23.2 seconds — ABNORMAL HIGH (ref 11.4–15.2)

## 2018-08-17 MED ORDER — PREDNISONE 10 MG PO TABS
10.0000 mg | ORAL_TABLET | Freq: Every day | ORAL | Status: DC
  Administered 2018-08-18 – 2018-08-20 (×3): 10 mg via ORAL
  Filled 2018-08-17 (×3): qty 1

## 2018-08-17 MED ORDER — WARFARIN SODIUM 2 MG PO TABS
4.0000 mg | ORAL_TABLET | Freq: Once | ORAL | Status: AC
  Administered 2018-08-17: 4 mg via ORAL
  Filled 2018-08-17: qty 2

## 2018-08-17 NOTE — Progress Notes (Signed)
[place] PHYSICAL MEDICINE & REHABILITATION PROGRESS NOTE  Subjective/Complaints: Patient seen laying in bed this morning.  He states he slept well overnight.  She notes bowel urgency in the morning for the last 3 days.  ROS +Congestion.  Denies CP, SOB, N/V/D  Objective: Vital Signs: Blood pressure 101/77, pulse 66, temperature 97.7 F (36.5 C), temperature source Oral, resp. rate 18, height 5\' 4"  (1.626 m), weight 93 kg, SpO2 99 %. No results found. No results for input(s): WBC, HGB, HCT, PLT in the last 72 hours. No results for input(s): NA, K, CL, CO2, GLUCOSE, BUN, CREATININE, CALCIUM in the last 72 hours.  Physical Exam: BP 101/77 (BP Location: Left Arm)   Pulse 66   Temp 97.7 F (36.5 C) (Oral)   Resp 18   Ht 5\' 4"  (1.626 m)   Wt 93 kg   SpO2 99%   BMI 35.19 kg/m  Constitutional: No distress . Vital signs reviewed. HENT: Normocephalic.  Atraumatic. Eyes: EOMI. No discharge. Cardiovascular: RRR.  No JVD. Respiratory: Clear.  Normal effort.  + [place]. GI: BS +. Non-distended. Musc: Mild tenderness left knee. Neuro: Alert and oriented. Motor: Bilateral upper extremities: 4+/5 proximal distal unchanged Bilateral lower extremities: Hip flexion 3+/5, knee extension 4-/5, ankle dorsiflexion 4+/5, improving Skin:Trach site healing Psych: Normal mood and behavior.   Assessment/Plan: 1. Functional deficits secondary to debility which require 3+ hours per day of interdisciplinary therapy in a comprehensive inpatient rehab setting.  Physiatrist is providing close team supervision and 24 hour management of active medical problems listed below.  Physiatrist and rehab team continue to assess barriers to discharge/monitor patient progress toward functional and medical goals  Care Tool:  Bathing    Body parts bathed by patient: Right arm, Left arm, Chest, Abdomen, Front perineal area, Right upper leg, Left upper leg, Face, Right lower leg, Left lower leg   Body parts bathed  by helper: Buttocks, Right lower leg, Left lower leg     Bathing assist Assist Level: Supervision/Verbal cueing(seated)     Upper Body Dressing/Undressing Upper body dressing   What is the patient wearing?: Hospital gown only    Upper body assist Assist Level: Supervision/Verbal cueing    Lower Body Dressing/Undressing Lower body dressing      What is the patient wearing?: Pants     Lower body assist Assist for lower body dressing: Contact Guard/Touching assist     Toileting Toileting Toileting Activity did not occur (Clothing management and hygiene only): Safety/medical concerns  Toileting assist Assist for toileting: Total Assistance - Patient < 25%     Transfers Chair/bed transfer  Transfers assist  Chair/bed transfer activity did not occur: Safety/medical concerns  Chair/bed transfer assist level: Contact Guard/Touching assist     Locomotion Ambulation   Ambulation assist      Assist level: Contact Guard/Touching assist Assistive device: Walker-rolling Max distance: 25   Walk 10 feet activity   Assist  Walk 10 feet activity did not occur: Safety/medical concerns  Assist level: Contact Guard/Touching assist     Walk 50 feet activity   Assist Walk 50 feet with 2 turns activity did not occur: Safety/medical concerns         Walk 150 feet activity   Assist Walk 150 feet activity did not occur: Safety/medical concerns         Walk 10 feet on uneven surface  activity   Assist Walk 10 feet on uneven surfaces activity did not occur: Safety/medical concerns  Wheelchair     Assist   Type of Wheelchair: Chief of Staff assist level: Supervision/Verbal cueing Max wheelchair distance: 130    Wheelchair 50 feet with 2 turns activity    Assist        Assist Level: Supervision/Verbal cueing   Wheelchair 150 feet activity     Assist Wheelchair 150 feet activity did not occur: Safety/medical  concerns          Medical Problem List and Plan: 1.Debilitysecondary to acute on chronic respiratory failure. Patient not on prednisone prior to admission. Continue chronic oxygen 3 L as prior to admission. Patient does not have a routine pulmonologist and will need a referral  Continue CIR  Taper steroids reduced to 15mg  on 3/1, decreased again on 3/4 2. Antithrombotics: -DVT/anticoagulation:Coumadin for pulmonary emboli  INR therapeutic on 3/3 -antiplatelet therapy: ASA 81mg  qd 3. Pain Management:Voltaren as directed.Tylenol as needed  Robaxin as needed started on 2/28 4. Mood:Provide emotional support -antipsychotic agents: Clonazepam 0.5 mg 3 times a day 5. Neuropsych: This patientiscapable of making decisions on herown behalf. 6. Skin/Wound Care:Routine skin checks 7. Fluids/Electrolytes/Nutrition:Routine in and out's   BMP elevated BUN vs 2/17 creat stable enc fluid 8. 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 Dillard Cannon. Decannulated 9. Chronic aplastic anemia. Patient multiple transfusions in the past.   Hemoglobin 13.3 on 2/28 10. Diastolic congestive heart failure. Lasix 40 mg daily. Monitor for any signs of fluid overload Filed Weights   08/12/18 1400 08/15/18 0529  Weight: 92.9 kg 93 kg   Stable on 3/3 11. Hypertension Vitals:   08/17/18 0350 08/17/18 0600  BP: 104/82 101/77  Pulse: 78 66  Resp: 18   Temp: 97.7 F (36.5 C)   SpO2: 99% 99%    Reduced amlodipine to 2.5 on 3/1  Cont current dose metoprolol 75mg  TID   Controlled on 3/3 12. Constipation. Laxative assistance 13.  Steroid-induced hyperglycemia  Labile on 3/3 14. Congestion  Mucinex started on 3/2  LOS: 5 days A FACE TO FACE EVALUATION WAS PERFORMED  Deirdra Heumann Karis Juba 08/17/2018, 8:08 AM

## 2018-08-17 NOTE — Progress Notes (Signed)
Occupational Therapy Session Note  Patient Details  Name: Kelsey Jensen MRN: [id number] Date of Birth: [birth year]  Today's Date: 08/17/2018 OT Individual Time: 1015-1100 OT Individual Time Calculation (min): 45 min    Short Term Goals: Week 1:  OT Short Term Goal 1 (Week 1): Pt will stand at sink for oral care to demo improve endurance OT Short Term Goal 2 (Week 1): Pt will don B socks wiht S and AE PRN OT Short Term Goal 3 (Week 1): Pt will transfer to toilet wiht MIN A OT Short Term Goal 4 (Week 1): Pt will stand for >3 min and MIN A to demo improved functional standing tolerance required for ADLs  Skilled Therapeutic Interventions/Progress Updates:    Pt received in wc requesting to use the bathroom. Pt stated at home she removes her O2 to walk into the bathroom. With cues for technique to push up with B hands, pt stood with CGA to her RW and then ambulated into bathroom with CGA. She was able to complete all steps of toileting with close S. She then became short of breath so placed O2 on and had pt sit on toilet with O2 to regain energy.  Discussed how taxing activity can be with out support of O2 and how she should use it at all times now. Pt agreed.  She then ambulated to sink to wash hands and returned to w/c. Discussed her L hip pain which pt stated is chronic for the last 3 years since a fall.  She has very limited shoulder ROM due to arthritis.  To maintain function of shoulders, educated pt on exercises she can do on her own and pt practiced those exercises (reverse shoulder rolls and single arm circles).   Pt worked on activity tolerance using a 2 lb dowel bar doing rowing exercises with shoulders at 45 degrees of flexion and elbow flexion exercises.   Pt tolerated exercises well but did need frequent short rest breaks.  Pt resting in w/c with belt alarm on and all needs met.    Therapy Documentation Precautions:  Precautions Precautions: Fall Restrictions Weight Bearing  Restrictions: No    Vital Signs: Therapy Vitals Pulse Rate: 66 BP: 101/77 Patient Position (if appropriate): Lying Oxygen Therapy SpO2: 99 % O2 Device: Nasal Cannula O2 Flow Rate (L/min): 3 L/min Pain: Pain Assessment Pain Scale: 0-10 Pain Score: 4  L hip    Therapy/Group: Individual Therapy  [place] 08/17/2018, 8:28 AM

## 2018-08-17 NOTE — Progress Notes (Signed)
Occupational Therapy Session Note  Patient Details  Name: Kelsey Jensen MRN: [id number] Date of Birth: 07-08-1950  Today's Date: 08/17/2018 OT Individual Time: 1420-1500 OT Individual Time Calculation (min): 40 min    Short Term Goals: Week 1:  OT Short Term Goal 1 (Week 1): Pt will stand at sink for oral care to demo improve endurance OT Short Term Goal 2 (Week 1): Pt will don B socks wiht S and AE PRN OT Short Term Goal 3 (Week 1): Pt will transfer to toilet wiht MIN A OT Short Term Goal 4 (Week 1): Pt will stand for >3 min and MIN A to demo improved functional standing tolerance required for ADLs  Skilled Therapeutic Interventions/Progress Updates:    Pt seen for OT session focusing on functional activity tolerance and standing balance/endurance. Pt sitting up in w/c upon arrival, complaints of generalized fatigue and chronic pain, willing to participate in therapy as able. Pt on 3L supplemental O2 upon arrival, 97% at rest. Transitioned to 2L during therapy session, remaining >94% throughout session with standing trials and short distance ambulation.  In therapy gym, stood to complete table top puzzle, min A to power into standing. Heavy UE reliance on table while completing puzzle 2/2 decreased functional activity tolerance. Pt tolerating ~5 minutes in standing before requiring seated rest break, completed x2 trials with seated rest break btwn trials. Pt required cuing to initiate rest breaks when needed. Provided education regarding increased risk of falls with fatigue and energy conservation techniques. She ambulated ~46ft with RW and min A before requiring seated rest break and then ambulated ~10 more ft before returning to w/c. Pt returned to room at end of session, left sitting upright in w/c with chair belt alarm on and all needs in reach.  Education provided throughout session regarding energy conservation, fall risk, and d/c planning.  Therapy Documentation Precautions:   Precautions Precautions: Fall Restrictions Weight Bearing Restrictions: No   Therapy/Group: Individual Therapy  Caelin Rosen L 08/17/2018, 7:09 AM

## 2018-08-17 NOTE — Progress Notes (Signed)
ANTICOAGULATION CONSULT NOTE - Follow Up Consult  Pharmacy Consult for Warfarin Indication: pulmonary embolus  Allergies  Allergen Reactions  . Sulfa Antibiotics Nausea And Vomiting    Patient Measurements: Height: 5\' 4"  (162.6 cm) Weight: 205 lb 0.4 oz (93 kg) IBW/kg (Calculated) : 54.7  Vital Signs: Temp: 97.7 F (36.5 C) (03/03 0350) Temp Source: Oral (03/03 0350) BP: 101/77 (03/03 0600) Pulse Rate: 66 (03/03 0600)  Labs: Recent Labs    08/15/18 0552 08/16/18 0555 08/17/18 0532  LABPROT 35.3* 27.5* 23.2*  INR 3.6* 2.6* 2.1*    Estimated Creatinine Clearance: 56.7 mL/min (A) (by C-G formula based on SCr of 1.02 mg/dL (H)).  Assessment: 68 year old female transferred from [facility] Started on warfarin 08/03/18 for PE. INR back in range yesterday and today.   Previous dosing regimen as follows: 2/21 INR 1.10 -> 7.5 mg 2/22 INR 1.2 -> 7.5 mg 2/23 INR 1.52 - > 10 mg 2/24 INR 1.89 -> 10 mg 2/25 INR 2.8 -> 5 mg 2/26 INR 2.6 -> 5 mg 2/27 INR 2.7 2/28 INR 2.9 2/29 INR 3.0 3/1 INR 3.6  Goal of Therapy:  INR 2-3 Monitor platelets by anticoagulation protocol: Yes   Plan:  Coumadin 4mg  PO x1 Daily INR  Ulyses Southward, PharmD, BCIDP, AAHIVP, CPP Infectious Disease Pharmacist 08/17/2018 8:41 AM

## 2018-08-17 NOTE — Progress Notes (Signed)
Physical Therapy Session Note  Patient Details  Name: Kelsey Jensen MRN: [id number] Date of Birth: 1951-03-07  Today's Date: 08/17/2018 PT Individual Time: 0805-0900 and 3825-0539 PT Individual Time Calculation (min): 55 min and 40 min  Short Term Goals: Week 1:  PT Short Term Goal 1 (Week 1): Pt will perform sit<>stand with Supervision assist  PT Short Term Goal 2 (Week 1): Pt will transfers to [facility] with CGA consistently  PT Short Term Goal 3 (Week 1): Pt will propell WC >150 ft with supervision assist  PT Short Term Goal 4 (Week 1): Pt will ambulate 2f with min assist and LRAD   Skilled Therapeutic Interventions/Progress Updates:  Tx1: Pt presented in w/c with nsg present agreeable to therapy. Pt stating increased pain in L hip/groin new from yesterday 8/10, nsg aware. PTA threaded pants total A for time management and pt performed STS with minA and cues for increasing anterior wt shifting. Pt was able to complete pulling pants up over hips in standing CGA. Pt then propelled to nsg station with x 2 brief rest breaks for BUE strengthening and endurance. Pt then transported remaining distance to rehab gym and participated in seated and standing therex at parallel bars. Pt performed as follows: LAQ 2 x 10 Ankle pumps 2 x 10 Standing  Hip flexion 2 x 10 Hip abd x 10 (limited due to increased L hip pain) Standing SLR 2 x 10  Pt required frequent rest breaks due to pain and fatigue.  Pt returned to room at end of session and left with belt alarm on, call bell within reach and needs met.     Tx2: Pt presented in w/c agreeable to therapy. Pt stating pain in L hip/groin still present but subsided from this am. Pt transported to rehab gym total A for time management and performed gait training with RW. Pt ambulated 272fx 4 with RW and extended seated rests. During rest breaks pt educated on pacing during ambulation as PTA observed increased cadence initially then significantly slowing down with  increased distance. Pt verbalized understanding. Pt attempting toe taps to 6in step however unable to clear 6in step with LLE, no significant increase in pain per pt. Pt was able to complete toe taps to 3in steps. Pt also performed ascending/descending x 8 steps B rails on 3 in steps. Pt returned to w/c at end of session and transported back to room. Pt remained in w/c at end of session and left with call bell within reach and needs met.      Therapy Documentation Precautions:  Precautions Precautions: Fall Restrictions Weight Bearing Restrictions: No General:   Vital Signs: Therapy Vitals Temp: 98.8 F (37.1 C) Temp Source: Oral Pulse Rate: 81 Resp: 16 BP: 94/67 Patient Position (if appropriate): Sitting Oxygen Therapy SpO2: 98 % O2 Device: Nasal Cannula O2 Flow Rate (L/min): 3 L/min    Therapy/Group: Individual Therapy  Angelyse Heslin  Roman Dubuc, PTA  08/17/2018, 4:27 PM

## 2018-08-18 ENCOUNTER — Inpatient Hospital Stay (HOSPITAL_COMMUNITY): Payer: Medicare Other | Admitting: Physical Therapy

## 2018-08-18 ENCOUNTER — Inpatient Hospital Stay (HOSPITAL_COMMUNITY): Payer: Medicare Other

## 2018-08-18 LAB — GLUCOSE, CAPILLARY
Glucose-Capillary: 83 mg/dL (ref 70–99)
Glucose-Capillary: 110 mg/dL — ABNORMAL HIGH (ref 70–99)
Glucose-Capillary: 133 mg/dL — ABNORMAL HIGH (ref 70–99)
Glucose-Capillary: 97 mg/dL (ref 70–99)

## 2018-08-18 LAB — PROTIME-INR
INR: 2 — ABNORMAL HIGH (ref 0.8–1.2)
Prothrombin Time: 22.1 seconds — ABNORMAL HIGH (ref 11.4–15.2)

## 2018-08-18 MED ORDER — WARFARIN SODIUM 2 MG PO TABS
4.0000 mg | ORAL_TABLET | Freq: Every day | ORAL | Status: DC
  Administered 2018-08-18 – 2018-08-24 (×7): 4 mg via ORAL
  Filled 2018-08-18 (×7): qty 2

## 2018-08-18 NOTE — Progress Notes (Signed)
Physical Therapy Session Note  Patient Details  Name: Kelsey Jensen MRN: [id number] Date of Birth: 09/30/1950  Today's Date: 08/18/2018 PT Individual Time: 0805-0915 PT Individual Time Calculation (min): 70 min   Short Term Goals: Week 1:  PT Short Term Goal 1 (Week 1): Pt will perform sit<>stand with Supervision assist  PT Short Term Goal 2 (Week 1): Pt will transfers to WC with CGA consistently  PT Short Term Goal 3 (Week 1): Pt will propell WC >150 ft with supervision assist  PT Short Term Goal 4 (Week 1): Pt will ambulate 25ft with min assist and LRAD   Skilled Therapeutic Interventions/Progress Updates: Pt presented in w/c performing UB bathing and dressing with set from NT. Pt stating received KPack for L hip pain and has helped. Pt donned pants with set up supervision from w/c and performed STS from w/c to complete pulling pants over hips with Close S. Pt noted to have nasal cannula on however hose not hooked up to oxygen.VItals assessed BP 88/60, SpO2 95% on RA. PTA updated safety care plan for CGA ambulatory transfersPt then propelled partially to rehab gym supervision with PTA assisting around nsg station due to significant congestion in hallway. Pt participated in hip flexion to 4in step 3 bouts x 8 bilaterally for LE strengthening and endurance. Pt also participated in x 2 bouts of horseshoes in standing for endurance and standing tolerance. BP assessed during activity 97/79 HR 96. Participated in ambulation 45ft with RW with HR and O2 stable after activity. Pt able to ambulate additional 15ft before requesting seat due to fatigue. Pt transported back to room at end of session and left with call bell within reach and needs met.      Therapy Documentation Precautions:  Precautions Precautions: Fall Restrictions Weight Bearing Restrictions: No General:   Vital Signs: Therapy Vitals Temp: 97.8 F (36.6 C) Temp Source: Oral Pulse Rate: 93 Resp: 18 BP: 95/71 Patient Position  (if appropriate): Sitting Oxygen Therapy SpO2: 98 % O2 Device: Room Air    Therapy/Group: Individual Therapy  Rosita DeChalus  Rosita DeChalus, PTA  08/18/2018, 2:36 PM  

## 2018-08-18 NOTE — Progress Notes (Signed)
Physical Therapy Session Note  Patient Details  Name: Kelsey Jensen MRN: [id number] Date of Birth: 10/11/1950  Today's Date: 08/18/2018 PT Individual Time: 1530-1630 PT Individual Time Calculation (min): 60 min   Short Term Goals: Week 1:  PT Short Term Goal 1 (Week 1): Pt will perform sit<>stand with Supervision assist  PT Short Term Goal 2 (Week 1): Pt will transfers to WC with CGA consistently  PT Short Term Goal 3 (Week 1): Pt will propell WC >150 ft with supervision assist  PT Short Term Goal 4 (Week 1): Pt will ambulate 25ft with min assist and LRAD   Skilled Therapeutic Interventions/Progress Updates:   Pt received supine in bed and agreeable to PT. Supine>sit transfer with supervision assist and min cues for use of bed rails.   Stand pivot transfer to WC with CGA from elevated bed height.  WC mobility x 180ft with supervision assist from PT, as well as min cues for improved shoulder ROM with wheel propulsion to reduce overall   Gait training with RW 2x 50ft and CGA from PT for safety, min cues for pursed lip breathing at end of gait training due to increased SOB. SpO2 >96% throughout all gait training.   Reciprocal foot taps on 2 inch step. R LE foot taps on 6 inch step, unabel to light LLE to tap on 6in . Min cues for posture and step-to gait pattern. Step with onto 6 inch step x 2  at stairs  With bUE support on Bil rails and min assist from PT for safety.  Pt returned to room and performed ambulatory transfer to bed with CGA assist from PT. Sit>supine completed with mod assist to management BLE, and left supine in bed with call bell in reach and all needs met.        Therapy Documentation Precautions:  Precautions Precautions: Fall Restrictions Weight Bearing Restrictions: No Vital Signs: Therapy Vitals Temp: 97.8 F (36.6 C) Temp Source: Oral Pulse Rate: 93 Resp: 18 BP: 95/71 Patient Position (if appropriate): Sitting Oxygen Therapy SpO2: 98 % O2 Device:  Room Air Pain:   denies  Therapy/Group: Individual Therapy  Austin E Tucker 08/18/2018, 4:33 PM  

## 2018-08-18 NOTE — Progress Notes (Signed)
Occupational Therapy Session Note  Patient Details  Name: Kelsey Jensen MRN: [id number] Date of Birth: 02/03/1951  Today's Date: 08/18/2018 OT Individual Time: 1000-1100 OT Individual Time Calculation (min): 60 min    Short Term Goals: Week 1:  OT Short Term Goal 1 (Week 1): Pt will stand at sink for oral care to demo improve endurance OT Short Term Goal 2 (Week 1): Pt will don B socks wiht S and AE PRN OT Short Term Goal 3 (Week 1): Pt will transfer to toilet wiht MIN A OT Short Term Goal 4 (Week 1): Pt will stand for >3 min and MIN A to demo improved functional standing tolerance required for ADLs  Skilled Therapeutic Interventions/Progress Updates:    Pt received in w/c with no c/o pain. Pt completed 10 ft of functional mobility with RW into bathroom. Cueing for safety/RW management during transfer to [facility] in walk in shower. Pt sat and completed UB bathing with set up only. (S) provided during standing level LB bathing with moderate use of grab bars. Pt required vc for rest breaks and breathing techniques. Pt used RW to return to w/c with audible SOB and wheezing. Vitals assess and all VSS with SpO2 at 99% on 3L [place]. Teds were donned total A for time management and pt used sock aid to don non grip socks with set up only. Pt able to thread B LE through pants and then required min A to power up into standing d/t progressive fatigue. Once in standing pt able to pull up pants with CGA. Pt completed grooming task at sink and was left sitting up in w/c with chair alarm belt fastened and all needs met.   Following shower with no ted hose on: BP: 116/79  Therapy Documentation Precautions:  Precautions Precautions: Fall Restrictions Weight Bearing Restrictions: No Pain:  No pain reported.    Therapy/Group: Individual Therapy  Curtis Sites 08/18/2018, 7:21 AM

## 2018-08-18 NOTE — Progress Notes (Signed)
[facility] PHYSICAL MEDICINE & REHABILITATION PROGRESS NOTE  Subjective/Complaints: Patient seen laying in bed this morning.  She states she slept well overnight.  She states she feels better.  She notes improvement in congestion.  ROS +Congestion improving.  Denies CP, SOB, N/V/D  Objective: Vital Signs: Blood pressure 104/75, pulse 86, temperature 98 F (36.7 C), temperature source Oral, resp. rate 18, height 5\' 4"  (1.626 m), weight 93.1 kg, SpO2 98 %. No results found. No results for input(s): WBC, HGB, HCT, PLT in the last 72 hours. No results for input(s): NA, K, CL, CO2, GLUCOSE, BUN, CREATININE, CALCIUM in the last 72 hours.  Physical Exam: BP 104/75   Pulse 86   Temp 98 F (36.7 C) (Oral)   Resp 18   Ht 5\' 4"  (1.626 m)   Wt 93.1 kg   SpO2 98%   BMI 35.23 kg/m  Constitutional: No distress . Vital signs reviewed. HENT: Normocephalic.  Atraumatic. Eyes: EOMI. No discharge. Cardiovascular: RRR.  No JVD. Respiratory: Clear.  Normal effort.  + Fletcher. GI: BS +. Non-distended. Musc: No edema or tenderness in extremities. Neuro: Alert and oriented. Motor: Bilateral upper extremities: 4+/5 proximal distal, stable Bilateral lower extremities: Hip flexion 3+/5, knee extension 4-/5, ankle dorsiflexion 4+/5, improving Skin:Trach site healing Psych: Normal mood and behavior.   Assessment/Plan: 1. Functional deficits secondary to debility which require 3+ hours per day of interdisciplinary therapy in a comprehensive inpatient rehab setting.  Physiatrist is providing close team supervision and 24 hour management of active medical problems listed below.  Physiatrist and rehab team continue to assess barriers to discharge/monitor patient progress toward functional and medical goals  Care Tool:  Bathing    Body parts bathed by patient: Right arm, Left arm, Chest, Abdomen, Front perineal area, Right upper leg, Left upper leg, Face, Right lower leg, Left lower leg   Body parts  bathed by helper: Buttocks, Right lower leg, Left lower leg     Bathing assist Assist Level: Supervision/Verbal cueing(seated)     Upper Body Dressing/Undressing Upper body dressing   What is the patient wearing?: Hospital gown only    Upper body assist Assist Level: Supervision/Verbal cueing    Lower Body Dressing/Undressing Lower body dressing      What is the patient wearing?: Pants     Lower body assist Assist for lower body dressing: Contact Guard/Touching assist     Toileting Toileting Toileting Activity did not occur (Clothing management and hygiene only): Safety/medical concerns  Toileting assist Assist for toileting: Contact Guard/Touching assist     Transfers Chair/bed transfer  Transfers assist  Chair/bed transfer activity did not occur: Safety/medical concerns  Chair/bed transfer assist level: Contact Guard/Touching assist     Locomotion Ambulation   Ambulation assist      Assist level: Contact Guard/Touching assist Assistive device: Walker-rolling Max distance: 29ft   Walk 10 feet activity   Assist  Walk 10 feet activity did not occur: Safety/medical concerns  Assist level: Contact Guard/Touching assist Assistive device: Walker-rolling   Walk 50 feet activity   Assist Walk 50 feet with 2 turns activity did not occur: Safety/medical concerns         Walk 150 feet activity   Assist Walk 150 feet activity did not occur: Safety/medical concerns         Walk 10 feet on uneven surface  activity   Assist Walk 10 feet on uneven surfaces activity did not occur: Safety/medical concerns  Wheelchair     Assist   Type of Wheelchair: Chief of Staff assist level: Supervision/Verbal cueing Max wheelchair distance: 130    Wheelchair 50 feet with 2 turns activity    Assist        Assist Level: Supervision/Verbal cueing   Wheelchair 150 feet activity     Assist Wheelchair 150 feet activity did  not occur: Safety/medical concerns          Medical Problem List and Plan: 1.Debilitysecondary to acute on chronic respiratory failure. Patient not on prednisone prior to admission. Continue chronic oxygen 3 L as prior to admission. Patient does not have a routine pulmonologist and will need a referral  Continue CIR  Taper steroids reduced to 15mg  on 3/1, decreased again on 3/4  Team conference today to discuss current and goals and coordination of care, home and environmental barriers, and discharge planning with nursing, case manager, and therapies.  2. Antithrombotics: -DVT/anticoagulation:Coumadin for pulmonary emboli  INR therapeutic on 3/4, however trending down -antiplatelet therapy: ASA 81mg  qd 3. Pain Management:Voltaren as directed.Tylenol as needed  Robaxin as needed started on 2/28 4. Mood:Provide emotional support -antipsychotic agents: Clonazepam 0.5 mg 3 times a day 5. Neuropsych: This patientiscapable of making decisions on herown behalf. 6. Skin/Wound Care:Routine skin checks 7. Fluids/Electrolytes/Nutrition:Routine in and out's   BMP elevated BUN vs 2/17 creat stable enc fluid 8. 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 Dillard Cannon. Decannulated 9. Chronic aplastic anemia. Patient multiple transfusions in the past.   Hemoglobin 13.3 on 2/28 10. Diastolic congestive heart failure. Lasix 40 mg daily. Monitor for any signs of fluid overload Filed Weights   08/12/18 1400 08/15/18 0529 08/18/18 0630  Weight: 92.9 kg 93 kg 93.1 kg   Stable on 3/4 11. Hypertension Vitals:   08/18/18 0423 08/18/18 0635  BP: 106/76 104/75  Pulse: 73 86  Resp: 18   Temp: 98 F (36.7 C)   SpO2: 98%     Reduced amlodipine to 2.5 on 3/1  Cont current dose metoprolol 75mg  TID   Controlled on 3/4 12. Constipation. Laxative assistance 13.  Steroid-induced hyperglycemia  Relatively controlled on 3/4 14. Congestion  Mucinex started on  3/2  Improving  LOS: 6 days A FACE TO FACE EVALUATION WAS PERFORMED  Ankit Karis Juba 08/18/2018, 8:28 AM

## 2018-08-19 ENCOUNTER — Inpatient Hospital Stay (HOSPITAL_COMMUNITY): Payer: Medicare Other | Admitting: Physical Therapy

## 2018-08-19 ENCOUNTER — Inpatient Hospital Stay (HOSPITAL_COMMUNITY): Payer: Medicare Other | Admitting: Occupational Therapy

## 2018-08-19 LAB — GLUCOSE, CAPILLARY
Glucose-Capillary: 84 mg/dL (ref 70–99)
Glucose-Capillary: 109 mg/dL — ABNORMAL HIGH (ref 70–99)
Glucose-Capillary: 208 mg/dL — ABNORMAL HIGH (ref 70–99)
Glucose-Capillary: 148 mg/dL — ABNORMAL HIGH (ref 70–99)

## 2018-08-19 LAB — PROTIME-INR
INR: 2.1 — AB (ref 0.8–1.2)
Prothrombin Time: 23.1 seconds — ABNORMAL HIGH (ref 11.4–15.2)

## 2018-08-19 NOTE — Progress Notes (Signed)
ANTICOAGULATION CONSULT NOTE - Follow Up Consult  Pharmacy Consult for Warfarin Indication: pulmonary embolus  Allergies  Allergen Reactions  . Sulfa Antibiotics Nausea And Vomiting    Patient Measurements: Height: 5\' 4"  (162.6 cm) Weight: 205 lb 4 oz (93.1 kg) IBW/kg (Calculated) : 54.7  Vital Signs: Temp: 98.3 F (36.8 C) (03/05 0415) Temp Source: Oral (03/05 0415) BP: 91/59 (03/05 0415) Pulse Rate: 64 (03/05 0415)  Labs: Recent Labs    08/17/18 0532 08/18/18 0539 08/19/18 0551  LABPROT 23.2* 22.1* 23.1*  INR 2.1* 2.0* 2.1*    Estimated Creatinine Clearance: 56.8 mL/min (A) (by C-G formula based on SCr of 1.02 mg/dL (H)).  Assessment: 68 year old female transferred from [facility] Started on warfarin 08/03/18 for PE. INR remains stable on her current regimen and within the therapeutic range.   Previous dosing regimen as follows: 2/21 INR 1.10 -> 7.5 mg 2/22 INR 1.2 -> 7.5 mg 2/23 INR 1.52 - > 10 mg 2/24 INR 1.89 -> 10 mg 2/25 INR 2.8 -> 5 mg 2/26 INR 2.6 -> 5 mg 2/27 INR 2.7 2/28 INR 2.9 2/29 INR 3.0 3/1 INR 3.6  Goal of Therapy:  INR 2-3 Monitor platelets by anticoagulation protocol: Yes   Plan:  Coumadin 4mg  PO x1 Daily INR  Estella Husk, Pharm.D., BCPS, BCIDP Clinical Pharmacist Phone: [phone] Please check AMION for all [facility] numbers 08/19/2018, 8:33 AM

## 2018-08-19 NOTE — Progress Notes (Signed)
Occupational Therapy Session Note  Patient Details  Name: Kelsey Jensen MRN: [id number] Date of Birth: [birth year]  Today's Date: 08/19/2018 OT Individual Time: 9390-3009 OT Individual Time Calculation (min): 75 min    Short Term Goals: Week 1:  OT Short Term Goal 1 (Week 1): Pt will stand at sink for oral care to demo improve endurance OT Short Term Goal 2 (Week 1): Pt will don B socks wiht S and AE PRN OT Short Term Goal 3 (Week 1): Pt will transfer to toilet wiht MIN A OT Short Term Goal 4 (Week 1): Pt will stand for >3 min and MIN A to demo improved functional standing tolerance required for ADLs  Skilled Therapeutic Interventions/Progress Updates:    Pt completed wheelchair mobility throughout the session with supervision greater than 200' ft with rest breaks.  She was able to complete multiple sit to stands with supervision to min assist from the wheelchair,  and maintained standing balance with min guard assist for 1-2 mins while engaged in unilateral UE task using the Wii.  Oxygen sats were maintained at 95% or better on 2 ls nasal cannula with HR maintained at around 85%.  Returned to room at end of session with pt left eating lunch and call button and phone in reach.  Safety alarm belt in place as well.    Therapy Documentation Precautions:  Precautions Precautions: Fall Restrictions Weight Bearing Restrictions: No  Pain: Pain Assessment Pain Scale: Faces Pain Score: 0-No pain ADL: See Care Tool for some details of ADL  Therapy/Group: Individual Therapy  Xitlalli Newhard OTR/L 08/19/2018, 4:27 PM

## 2018-08-19 NOTE — Progress Notes (Signed)
Social Work Patient ID: Kelsey Jensen, female   DOB: [birth year], 68 y.o.   MRN: [id number]   Have reviewed team conference with pt and her son, Ivar Drape.  Both aware and agreeable with targeted d/c date of 3/11 and supervision goals overall.  Pt pleased with progress.  Son to reach out to pt's granddaughter who will be primary caregiver.  Continue to follow.  Kanetra Ho, LCSW

## 2018-08-19 NOTE — Progress Notes (Signed)
Kelsey Jensen  Subjective/Complaints: Patient seen sitting up in her chair this AM.  She states she slept well overnight.  She notes that she feels better.  She notes that she has bowel urgency only after breakfast in the morning.  Encouraged timed toileting.  ROS Denies CP, SOB, N/V/D  Objective: Vital Signs: Blood pressure 100/62, pulse 64, temperature 98.3 F (36.8 C), temperature source Oral, resp. rate 16, height 5\' 4"  (1.626 m), weight 93.1 kg, SpO2 99 %. No results found. No results for input(s): WBC, HGB, HCT, PLT in the last 72 hours. No results for input(s): NA, K, CL, CO2, GLUCOSE, BUN, CREATININE, CALCIUM in the last 72 hours.  Physical Exam: BP 100/62   Pulse 64   Temp 98.3 F (36.8 C) (Oral)   Resp 16   Ht 5\' 4"  (1.626 m)   Wt 93.1 kg   SpO2 99%   BMI 35.23 kg/m  Constitutional: No distress . Vital signs reviewed. HENT: Normocephalic.  Atraumatic. Eyes: EOMI. No discharge. Cardiovascular: RRR.  No JVD. Respiratory: Clear.  Normal effort. GI: BS +. Non-distended. Musc: No edema or tenderness in extremities. Neuro: Alert and oriented. Motor: Bilateral upper extremities: 4+/5 proximal distal, unchanged Bilateral lower extremities: Hip flexion 3+/5, knee extension 4-/5, ankle dorsiflexion 4+/5, improving Skin:Trach site healing Psych: Normal mood and behavior.   Assessment/Plan: 1. Functional deficits secondary to debility which require 3+ hours per day of interdisciplinary therapy in a comprehensive inpatient rehab setting.  Physiatrist is providing close team supervision and 24 hour management of active medical problems listed below.  Physiatrist and rehab team continue to assess barriers to discharge/monitor patient progress toward functional and medical goals  Care Tool:  Bathing    Body parts bathed by patient: Right arm, Left arm, Chest, Abdomen, Front perineal area, Right upper leg, Left upper leg,  Face, Right lower leg, Left lower leg   Body parts bathed by helper: Buttocks, Right lower leg, Left lower leg     Bathing assist Assist Level: Supervision/Verbal cueing(seated)     Upper Body Dressing/Undressing Upper body dressing   What is the patient wearing?: Hospital gown only    Upper body assist Assist Level: Supervision/Verbal cueing    Lower Body Dressing/Undressing Lower body dressing      What is the patient wearing?: Pants     Lower body assist Assist for lower body dressing: Contact Guard/Touching assist     Toileting Toileting Toileting Activity did not occur (Clothing management and hygiene only): Safety/medical concerns  Toileting assist Assist for toileting: Contact Guard/Touching assist     Transfers Chair/bed transfer  Transfers assist  Chair/bed transfer activity did not occur: Safety/medical concerns  Chair/bed transfer assist level: Supervision/Verbal cueing     Locomotion Ambulation   Ambulation assist      Assist level: Contact Guard/Touching assist Assistive device: Walker-rolling Max distance: 76ft   Walk 10 feet activity   Assist  Walk 10 feet activity did not occur: Safety/medical concerns  Assist level: Contact Guard/Touching assist Assistive device: Walker-rolling   Walk 50 feet activity   Assist Walk 50 feet with 2 turns activity did not occur: Safety/medical concerns  Assist level: Contact Guard/Touching assist Assistive device: Walker-rolling    Walk 150 feet activity   Assist Walk 150 feet activity did not occur: Safety/medical concerns         Walk 10 feet on uneven surface  activity   Assist Walk 10 feet on uneven surfaces activity did  not occur: Safety/medical concerns         Wheelchair     Assist   Type of Wheelchair: Manual    Wheelchair assist level: Supervision/Verbal cueing Max wheelchair distance: 150    Wheelchair 50 feet with 2 turns activity    Assist         Assist Level: Supervision/Verbal cueing   Wheelchair 150 feet activity     Assist Wheelchair 150 feet activity did not occur: Safety/medical concerns   Assist Level: Supervision/Verbal cueing      Medical Problem List and Plan: 1.Debilitysecondary to acute on chronic respiratory failure. Patient not on prednisone prior to admission. Continue chronic oxygen 3 L as prior to admission. Patient does not have a routine pulmonologist and will need a referral  Continue CIR  Taper steroids reduced to 15mg  on 3/1, decreased again on 3/4 2. Antithrombotics: -DVT/anticoagulation:Coumadin for pulmonary emboli  INR therapeutic on 3/5 -antiplatelet therapy: ASA 81mg  qd 3. Pain Management:Voltaren as directed.Tylenol as needed  Robaxin as needed started on 2/28 4. Mood:Provide emotional support -antipsychotic agents: Clonazepam 0.5 mg 3 times a day 5. Neuropsych: This patientiscapable of making decisions on herown behalf. 6. Skin/Wound Care:Routine skin checks 7. Fluids/Electrolytes/Nutrition:Routine in and out's   BMP elevated BUN vs 2/17 creat stable enc fluid  Labs ordered for tomorrow 8. 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 Dillard Cannon. Decannulated 9. Chronic aplastic anemia. Patient multiple transfusions in the past.   Hemoglobin 13.3 on 2/28 10. Diastolic congestive heart failure. Lasix 40 mg daily. Monitor for any signs of fluid overload Filed Weights   08/12/18 1400 08/15/18 0529 08/18/18 0630  Weight: 92.9 kg 93 kg 93.1 kg   Stable on 3/5 11. Hypertension Vitals:   08/19/18 0415 08/19/18 0836  BP: (!) 91/59 100/62  Pulse: 64   Resp: 16   Temp: 98.3 F (36.8 C)   SpO2: 99%     Reduced amlodipine to 2.5 on 3/1, DC'd on 3/6  Cont current dose metoprolol 75mg  TID   Borderline hypotension on 3/5 12. Constipation. Laxative assistance 13.  Steroid-induced hyperglycemia  Relatively controlled on 3/5 14. Congestion  Mucinex  started on 3/2  Improving 15.  Bowel urgency  Multifactorial with physiological component-happening only after eating breakfast in the mornings  Encourage timed toileting  LOS: 7 days A FACE TO FACE EVALUATION WAS PERFORMED  Kelsey Jensen 08/19/2018, 9:23 AM

## 2018-08-19 NOTE — Progress Notes (Addendum)
Physical Therapy Session Note  Patient Details  Name: Kelsey Jensen MRN: [id number] Date of Birth: Feb 23, 1951  Today's Date: 08/19/2018 PT Individual Time: 0900-1000 1530-1630 PT Individual Time Calculation (min): 60 min and 60 min   Short Term Goals: Week 1:  PT Short Term Goal 1 (Week 1): Pt will perform sit<>stand with Supervision assist  PT Short Term Goal 2 (Week 1): Pt will transfers to [facility] with CGA consistently  PT Short Term Goal 3 (Week 1): Pt will propell WC >150 ft with supervision assist  PT Short Term Goal 4 (Week 1): Pt will ambulate 107f with min assist and LRAD   Skilled Therapeutic Interventions/Progress Updates:  Session 1  Pt received sitting in WC and agreeable to PT. WC mobility x [id number] Supervision assist from PT for safety.   Stair management with 1 rail on the L x 2 + 3 with min assist fading to [place]. Min cues for UE placement, step to gait pattern, and posture. Cues for posterior descent to improve safety.   Sit<>stand transfer training completed x 10 throughout treatment with CGA progressing to supervision assist. Pt requires multiple attempts for sit>stand for each transfer with supervision assist to come to standing.   Nustep BUE/BLE x 6 min + 3 min with BLE only. Min cues for proper speed to reduce overall fatigue and allow improved time in endurance training. Pt rates RPE 8-9/10 following each bout.   Gait training with RW x [id number] close supervision assist from PT for safety. Cues for use of standing rest break as needed and posture.   Patient returned to room and [facility] reach and all needs met.      Session 2.    Pt received sitting in WC and agreeable to PT. Pt transported to hospital gift shop in WC. [facility] mobility training in simulated community environment x 150ft6fh supervision assist for safety and obstacle navigation. Gait training with RW through gift shop x 45ft 92f supervision assist from PT for  safety.   Sit<> stand completed x 10 intermittently throughout treatment. Initially, required min assist from. Cues for use of lower part of arm rest to allow increased forward trunk motion, and progressed to supervision assist.   Seated BLE therex with level 2 tband. HS curls, LAQ, hip abduction, hip flexion, ankle PF. All completed 2 x 10 with cues for improved ROM and decreased speed eccentrically.   Pt returned to room and performed stand pivot transfer to bed with Supervision assist and RW. Sit>supine completed with supervision assist and left supine in bed with call bell in reach and all needs met.         Therapy Documentation Precautions:  Precautions Precautions: Fall Restrictions Weight Bearing Restrictions: (P) No General:   Vital Signs: Therapy Vitals BP: 100/62 Pain: 0/1 0   Therapy/Group: Individual Therapy  AustinLorie Phenix020, 10:06 AM

## 2018-08-19 NOTE — Patient Care Conference (Signed)
Inpatient RehabilitationTeam Conference and Plan of Care Update Date: 08/18/2018   Time: 11:50 AM    Patient Name: Kelsey Jensen      Medical Record Number: [id number]  Date of Birth: 1951-05-12 Sex: Female         Room/Bed: 4M10C/4M10C-01 Payor Info: Payor: MEDICARE / Plan: MEDICARE PART A AND B / Product Type: *No Product type* /    Admitting Diagnosis: Debility respiratory failure  Admit Date/Time:  08/12/2018  2:52 PM Admission Comments: No comment available   Primary Diagnosis:  <principal problem not specified> Principal Problem: <principal problem not specified>  Patient Active Problem List   Diagnosis Date Noted  . Congestion of both ears   . Steroid-induced hyperglycemia   . Benign essential HTN   . Chronic diastolic (congestive) heart failure (HCC)   . Supplemental oxygen dependent   . Essential hypertension   . Aplastic anemia (HCC)   . Acute pain of left knee   . Pulmonary embolism (HCC)   . Debility 08/12/2018  . Acute pulmonary embolism (HCC)   . Acute on chronic respiratory failure with hypoxia (HCC) 06/18/2018  . Severe sepsis with septic shock (HCC) 06/18/2018  . Chronic congestive heart failure (HCC) 06/18/2018  . 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 AF (paroxysmal atrial fibrillation) (HCC) 06/18/2018    Expected Discharge Date: Expected Discharge Date: 08/25/18  Team Members Present: Physician leading conference: Dr. Claudette Laws Social Worker Present: Amada Jupiter, LCSW Nurse Present: Ronny Bacon, RN PT Present: Grier Rocher, PT OT Present: Other (comment)(Sandra Earlene Plater, OT) SLP Present: Jackalyn Lombard, SLP PPS Coordinator present : Fae Pippin     Current Status/Progress Goal Weekly Team Focus  Medical   Debilitysecondary to acute on chronic respiratory failure. Patient not on prednisone prior to admission. Continue chronic oxygen 3 L as prior to admission  Improve mobility, endurance, BP, congestion, follow  chronic aplastic anemia  See above   Bowel/Bladder   continent of bowel and bladder, LBM 08/19/18  continent of bowel and bladder  timed toileting   Swallow/Nutrition/ Hydration             ADL's   (S) UB dressing/bathing, CGA LB bathing/dressing with AD, CGA transfers  Supervision overall  functional activity tolerance, LB dressing with AD, ADL transfers   Mobility   CGA to supervision STS transfers from w/c, CGA ambulatory transfers, gait up to 13ft with RW  and supplemental O2 supervision, 3in stairs with B rails CGA  supervision transfers, gait, CGA stairs 1 rail   endurance, BLE strengthening, d/c planning    Communication             Safety/Cognition/ Behavioral Observations            Pain   denies pain  pain less than or equal jto 4/10  assess pain q4h and prn   Skin   no skin breakdown currently noted. scattered bruising   no new skin injury/breakdown  q shift skin assessment    Rehab Goals Patient on target to meet rehab goals: Yes *See Care Plan and progress notes for long and short-term goals.     Barriers to Discharge  Current Status/Progress Possible Resolutions Date Resolved   Physician    Medical stability     See above  Therapies, decongestant, optimize BP meds, follow labs      Nursing                  PT  OT                  SLP                SW                Discharge Planning/Teaching Needs:  Plan home with son and other family members who can provide 24/7 assistance.  Teaching needs TBD.   Team Discussion:  Making good gains.  Cont b/b.  CGA overall with supervision goals.    Revisions to Treatment Plan:  NA    Continued Need for Acute Rehabilitation Level of Care: The patient requires daily medical management by a physician with specialized training in physical medicine and rehabilitation for the following conditions: Daily direction of a multidisciplinary physical rehabilitation program to ensure safe treatment while  eliciting the highest outcome that is of practical value to the patient.: Yes Daily medical management of patient stability for increased activity during participation in an intensive rehabilitation regime.: Yes Daily analysis of laboratory values and/or radiology reports with any subsequent need for medication adjustment of medical intervention for : Pulmonary problems;Blood pressure problems;Other   I attest that I was present, lead the team conference, and concur with the assessment and plan of the team.   Josten Warmuth 08/19/2018, 4:03 PM

## 2018-08-20 ENCOUNTER — Inpatient Hospital Stay (HOSPITAL_COMMUNITY): Payer: Medicare Other | Admitting: Physical Therapy

## 2018-08-20 ENCOUNTER — Inpatient Hospital Stay (HOSPITAL_COMMUNITY): Payer: Medicare Other | Admitting: Occupational Therapy

## 2018-08-20 LAB — PROTIME-INR
INR: 2.2 — AB (ref 0.8–1.2)
Prothrombin Time: 24.2 seconds — ABNORMAL HIGH (ref 11.4–15.2)

## 2018-08-20 LAB — BASIC METABOLIC PANEL
Anion gap: 9 (ref 5–15)
BUN: 37 mg/dL — ABNORMAL HIGH (ref 8–23)
CO2: 24 mmol/L (ref 22–32)
Calcium: 8.8 mg/dL — ABNORMAL LOW (ref 8.9–10.3)
Chloride: 105 mmol/L (ref 98–111)
Creatinine, Ser: 1.07 mg/dL — ABNORMAL HIGH (ref 0.44–1.00)
GFR calc Af Amer: >60 mL/min (ref >60)
GFR calc non Af Amer: 53 mL/min — ABNORMAL LOW (ref >60)
Glucose, Bld: 98 mg/dL (ref 70–99)
POTASSIUM: 3.9 mmol/L (ref 3.5–5.1)
Sodium: 138 mmol/L (ref 135–145)

## 2018-08-20 LAB — GLUCOSE, CAPILLARY: Glucose-Capillary: 85 mg/dL (ref 70–99)

## 2018-08-20 MED ORDER — PREDNISONE 10 MG PO TABS
5.0000 mg | ORAL_TABLET | Freq: Every day | ORAL | Status: DC
  Administered 2018-08-21 – 2018-08-23 (×3): 5 mg via ORAL
  Filled 2018-08-20 (×3): qty 1

## 2018-08-20 NOTE — Progress Notes (Signed)
Occupational Therapy Weekly Progress Note  Patient Details  Name: Kelsey Jensen MRN: [id number] Date of Birth: 06/11/1951  Beginning of progress report period: August 13, 2018 End of progress report period: August 20, 2018  Today's Date: 08/20/2018 OT Individual Time: 4098-1191 OT Individual Time Calculation (min): 58 min    Patient has met 4 of 4 short term goals.  Kelsey Jensen is making steady progress with OT at this time.  She currently completes UB selfcare tasks at a supervision to min assist level, as well as LB selfcare at a supervision to min guard assist level with use of AE.  She continues to improve with functional sit to stand and transfers in and out of the tub shower using a tub bench as well as to the toilet.  Currently, she is able to complete both of those transfers with use of the RW and min guard assist.  Standing endurance is currently at a min guard assist level for up to 3 mins during functional activities.  Oxygen sats are remaining greater than 95% on 2 Ls nasal cannula.  Feel she is on target for established LTGs set at supervision level with anticipated discharge on 3/11.  Will continue with current OT POC until discharge.    Patient continues to demonstrate the following deficits: muscle weakness and decreased standing balance and decreased balance strategies and therefore will continue to benefit from skilled OT intervention to enhance overall performance with BADL.  Patient progressing toward long term goals..  Continue plan of care.  OT Short Term Goals Week 2:  OT Short Term Goal 1 (Week 2): Continue working on established supervision goals in preparation for discharge.    Skilled Therapeutic Interventions/Progress Updates:    Pt worked on Chemical engineer socks to start session.  She was able to complete doffing them by crossing one LE over the other, but could not donn them that way.  Therapist donned TEDs prior to her donning gripper socks with use  of the sockaide and supervision.  Educated pt on use of plastic bag corner placed over foot to assist when someone is helping here to donn the TEDs.  Next had pt roll herself down to the tubshower room with supervision.  Once there, she completed transfer from the wheelchair to the tub bench with supervision.  She then ambulated across to the therapy gym with min guard assist.  Issued pt a walker bag and had her work on standing with the walker to toss horse shoes.  After tossing them she then ambulated over and picked them up with use of the reach and min guard assist.  Increased dyspnea noted with task and pt needing rest break after 2-3 mins of standing.  She returned to the room at end of session with therapist assist and was then left up in the wheelchair for lunch.  Chair alarm in place and call button and phone in reach.    Therapy Documentation Precautions:  Precautions Precautions: Fall Precaution Comments: O2 dependency Restrictions Weight Bearing Restrictions: No  Pain:  No report of pain during session ADL: See Care Tool for some details of ADLs  Therapy/Group: Individual Therapy  Kelsey Jensen OTR/L 08/20/2018, 5:10 PM

## 2018-08-20 NOTE — Progress Notes (Signed)
[place] PHYSICAL MEDICINE & REHABILITATION PROGRESS NOTE  Subjective/Complaints: Patient seen sitting up in her chair this morning.  She states she slept well overnight.  She states she feels better.  ROS Denies CP, SOB, N/V/D  Objective: Vital Signs: Blood pressure 99/85, pulse 90, temperature 97.7 F (36.5 C), temperature source Oral, resp. rate 18, height 5\' 4"  (1.626 m), weight 93.1 kg, SpO2 97 %. No results found. No results for input(s): WBC, HGB, HCT, PLT in the last 72 hours. Recent Labs    08/20/18 0434  NA 138  K 3.9  CL 105  CO2 24  GLUCOSE 98  BUN 37*  CREATININE 1.07*  CALCIUM 8.8*    Physical Exam: BP 99/85 (BP Location: Left Arm)   Pulse 90   Temp 97.7 F (36.5 C) (Oral)   Resp 18   Ht 5\' 4"  (1.626 m)   Wt 93.1 kg   SpO2 97%   BMI 35.23 kg/m  Constitutional: No distress . Vital signs reviewed. HENT: Normocephalic.  Atraumatic. Eyes: EOMI. No discharge. Cardiovascular: RRR.  No JVD. Respiratory: Clear.  Normal effort. GI: BS +. Non-distended. Musc: No edema or tenderness in extremities. Neuro: Alert and oriented. Motor: Bilateral upper extremities: 4+/5 proximal distal, stable Bilateral lower extremities: Hip flexion 4+/5, knee extension 4+/5, ankle dorsiflexion 4+/5 Skin:Trach site healed Psych: Normal mood and behavior.   Assessment/Plan: 1. Functional deficits secondary to debility which require 3+ hours per day of interdisciplinary therapy in a comprehensive inpatient rehab setting.  Physiatrist is providing close team supervision and 24 hour management of active medical problems listed below.  Physiatrist and rehab team continue to assess barriers to discharge/monitor patient progress toward functional and medical goals  Care Tool:  Bathing    Body parts bathed by patient: Right arm, Left arm, Chest, Abdomen, Front perineal area, Right upper leg, Left upper leg, Face, Right lower leg, Left lower leg   Body parts bathed by helper:  Buttocks, Right lower leg, Left lower leg     Bathing assist Assist Level: Supervision/Verbal cueing(seated)     Upper Body Dressing/Undressing Upper body dressing   What is the patient wearing?: Hospital gown only    Upper body assist Assist Level: Supervision/Verbal cueing    Lower Body Dressing/Undressing Lower body dressing      What is the patient wearing?: Pants     Lower body assist Assist for lower body dressing: Contact Guard/Touching assist     Toileting Toileting Toileting Activity did not occur (Clothing management and hygiene only): Safety/medical concerns  Toileting assist Assist for toileting: Contact Guard/Touching assist     Transfers Chair/bed transfer  Transfers assist  Chair/bed transfer activity did not occur: Safety/medical concerns  Chair/bed transfer assist level: Supervision/Verbal cueing     Locomotion Ambulation   Ambulation assist      Assist level: Supervision/Verbal cueing Assistive device: Walker-rolling Max distance: 65   Walk 10 feet activity   Assist  Walk 10 feet activity did not occur: Safety/medical concerns  Assist level: Supervision/Verbal cueing Assistive device: Walker-rolling   Walk 50 feet activity   Assist Walk 50 feet with 2 turns activity did not occur: Safety/medical concerns  Assist level: Supervision/Verbal cueing Assistive device: Walker-rolling    Walk 150 feet activity   Assist Walk 150 feet activity did not occur: Safety/medical concerns         Walk 10 feet on uneven surface  activity   Assist Walk 10 feet on uneven surfaces activity did not occur: Safety/medical  concerns         Wheelchair     Assist   Type of Wheelchair: Manual    Wheelchair assist level: Supervision/Verbal cueing Max wheelchair distance: 200'    Wheelchair 50 feet with 2 turns activity    Assist        Assist Level: Supervision/Verbal cueing   Wheelchair 150 feet activity      Assist Wheelchair 150 feet activity did not occur: Safety/medical concerns   Assist Level: Supervision/Verbal cueing      Medical Problem List and Plan: 1.Debilitysecondary to acute on chronic respiratory failure. Patient not on prednisone prior to admission. Continue chronic oxygen 3 L as prior to admission. Patient does not have a routine pulmonologist and will need a referral  Continue CIR  Taper steroids reduced to 15mg  on 3/1, decreased again on 3/4 2. Antithrombotics: -DVT/anticoagulation:Coumadin for pulmonary emboli  INR therapeutic on 3/6 -antiplatelet therapy: ASA 81mg  qd 3. Pain Management:Voltaren as directed.Tylenol as needed  Robaxin as needed started on 2/28 4. Mood:Provide emotional support -antipsychotic agents: Clonazepam 0.5 mg 3 times a day 5. Neuropsych: This patientiscapable of making decisions on herown behalf. 6. Skin/Wound Care:Routine skin checks 7. Fluids/Electrolytes/Nutrition:Routine in and out's  8. 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 Dillard Cannon. Decannulated 9. Chronic aplastic anemia. Patient multiple transfusions in the past.   Hemoglobin 13.3 on 2/28 10. Diastolic congestive heart failure. Lasix 40 mg daily. Monitor for any signs of fluid overload Filed Weights   08/12/18 1400 08/15/18 0529 08/18/18 0630  Weight: 92.9 kg 93 kg 93.1 kg   Stable on 3/6 11. Hypertension Vitals:   08/19/18 2200 08/20/18 0611  BP: 96/72 99/85  Pulse:  90  Resp:  18  Temp:  97.7 F (36.5 C)  SpO2:  97%    Reduced amlodipine to 2.5 on 3/1, DC'd on 3/6  Cont current dose metoprolol 75mg  TID   Borderline hypotension on 3/6 12. Constipation. Laxative assistance 13.  Steroid-induced hyperglycemia  Labile on 3/6 14. Congestion  Mucinex started on 3/2  Improved 15.  Bowel urgency  Multifactorial with physiological component-happening only after eating breakfast in the mornings  Encourage timed  toileting 16.  AKI  Creatinine 1.07 on 3/6  Encourage fluids  LOS: 8 days A FACE TO FACE EVALUATION WAS PERFORMED  Momodou Consiglio Karis Juba 08/20/2018, 9:50 AM

## 2018-08-20 NOTE — Progress Notes (Signed)
ANTICOAGULATION CONSULT NOTE - Follow Up Consult  Pharmacy Consult for Warfarin Indication: pulmonary embolus/afib  Allergies  Allergen Reactions  . Sulfa Antibiotics Nausea And Vomiting    Patient Measurements: Height: 5\' 4"  (162.6 cm) Weight: 205 lb 4 oz (93.1 kg) IBW/kg (Calculated) : 54.7  Vital Signs: Temp: 97.7 F (36.5 C) (03/06 0611) Temp Source: Oral (03/06 0611) BP: 99/85 (03/06 0611) Pulse Rate: 90 (03/06 0611)  Labs: Recent Labs    08/18/18 0539 08/19/18 0551 08/20/18 0434  LABPROT 22.1* 23.1* 24.2*  INR 2.0* 2.1* 2.2*  CREATININE  --   --  1.07*    Estimated Creatinine Clearance: 54.1 mL/min (A) (by C-G formula based on SCr of 1.07 mg/dL (H)).  Assessment: 68 year old female transferred from [facility] Started on warfarin 08/03/18 for PE. INR remains stable on her current regimen and within the therapeutic range. She was previously not on coumadin prior to the PE due to hx GIB and low hgb. We will change her INR to MWF.  Previous dosing regimen as follows: 2/21 INR 1.10 -> 7.5 mg 2/22 INR 1.2 -> 7.5 mg 2/23 INR 1.52 - > 10 mg 2/24 INR 1.89 -> 10 mg 2/25 INR 2.8 -> 5 mg 2/26 INR 2.6 -> 5 mg 2/27 INR 2.7 2/28 INR 2.9 2/29 INR 3.0 3/1 INR 3.6  Goal of Therapy:  INR 2-3 Monitor platelets by anticoagulation protocol: Yes   Plan:  Coumadin 4mg  PO qday INR MWF  Ulyses Southward, PharmD, BCIDP, AAHIVP, CPP Infectious Disease Pharmacist 08/20/2018 8:27 AM

## 2018-08-20 NOTE — Progress Notes (Signed)
Physical Therapy Weekly Progress Note  Patient Details  Name: Kelsey Jensen MRN: [id number] Date of Birth: 14-Sep-1950  Beginning of progress report period: August 11, 2018 End of progress report period: August 20, 2018  Today's Date: 08/20/2018 PT Individual Time: 0905-1000 PT Individual Time Calculation (min): 55 min   Patient has met 4 of 4 short term goals.  Pt making steady progress towards long term goals. Pt has progressed to supervision assist level for bed mobility WC propulsion, CGA transfers and gait training, and min assist stair management. Significantly improved endurance as well as over the pas week has allowed improved safety and independence with all mobility  Patient continues to demonstrate the following deficits muscle weakness, decreased cardiorespiratoy endurance and decreased oxygen support and decreased standing balance and decreased balance strategies and therefore will continue to benefit from skilled PT intervention to increase functional independence with mobility.  Patient progressing toward long term goals..  Continue plan of care.  PT Short Term Goals Week 1:  PT Short Term Goal 1 (Week 1): Pt will perform sit<>stand with Supervision assist  PT Short Term Goal 1 - Progress (Week 1): Met PT Short Term Goal 2 (Week 1): Pt will transfers to [facility] with CGA consistently  PT Short Term Goal 2 - Progress (Week 1): Met PT Short Term Goal 3 (Week 1): Pt will propell WC >150 ft with supervision assist  PT Short Term Goal 3 - Progress (Week 1): Met PT Short Term Goal 4 (Week 1): Pt will ambulate 56f with min assist and LRAD  PT Short Term Goal 4 - Progress (Week 1): Met Week 2:  PT Short Term Goal 1 (Week 2): STG=LTG due to ELOS  Skilled Therapeutic Interventions/Progress Updates:  Session 1 Pt received sitting in WC and agreeable to PT  WC mobility 2 x [id number] cues or assist from PT. Pt required increased time due to fatigue and safety through doorways.  [facility]<>stand [facility] with min assist initially fading to supervision assist from PT with cues for UE and LE placement to improve safety.   Gait training x [id number] SUpervision assist from PT. Min cues from PT for AD management, safety in prepare for transfers, and focus on breathing.   Stair management training x 1 with 2 UE support on 1 rail and x 5 with BUE and 2 rails  Support. Cues for sttep to gait pattern, posture, and proper placement of UE.   Dynamic Standing balance to perform laterl reach outside BOS to grab and toss bean bag to target. 2 x 8 Bil. CGA-supervisoin assist from PT for safety with lateral reach outside BOS at end range. Cues for improve use of ankle strategy to correct LOB along with UE support on RW.   Patient returned to room and left sitting in [facility] call bell in reach and all needs met.     Session 2.   Pt received sitting in WC and agreeable to PT. WC mobility x [id number] cues from PT from [place] to [place] elClorox Companycreased time required with fatigue for last 30 ft.   PT instructed standing balance/standing tolerance activities with Wii sports bowling and Wii Fit. Pt able to stand through bow;ling for a total of 8 frames and max 5 minutes in standing. Wii Fit performed with UE support on RW for penguin slide x 2 and table tilt x 2. Min assist progressing to supervision assist for improved use of  ankle strategy to shift weight and improve posture.   UE therex with level 2 tband mid row 2 x 15. Bicep curl 2x 12, tricep extension 2 x 10 with min cues for proper speed and ROM from PT to improve strengthening aspect of movement.   Patient returned to room and left sitting in [facility] with call bell in reach and all needs met.          Therapy Documentation Precautions:  Precautions Precautions: Fall Restrictions Weight Bearing Restrictions: No General:   Vital Signs: Therapy Vitals Temp: 97.7 F (36.5  C) Temp Source: Oral Pulse Rate: 90 Resp: 18 BP: 99/85 Patient Position (if appropriate): Lying Oxygen Therapy SpO2: 97 % O2 Device: Nasal Cannula O2 Flow Rate (L/min): 3 L/min Pain: Session 1 Pain Assessment Pain Scale: 0-10 Pain Score: 0-No pain Session 2.  Pain Assessment Pain Scale: 0-10 Pain Score: 0-No pain  Therapy/Group: Individual Therapy  Lorie Phenix 08/20/2018, 10:07 AM

## 2018-08-21 ENCOUNTER — Inpatient Hospital Stay (HOSPITAL_COMMUNITY): Payer: Medicare Other | Admitting: Physical Therapy

## 2018-08-21 DIAGNOSIS — N179 Acute kidney failure, unspecified: Secondary | ICD-10-CM

## 2018-08-21 NOTE — Progress Notes (Signed)
[place] PHYSICAL MEDICINE & REHABILITATION PROGRESS NOTE  Subjective/Complaints: Patient seen sitting up in her chair getting ready this morning.  She states she slept well overnight.  She states she feels better.  ROS: denies CP, SOB, N/V/D  Objective: Vital Signs: Blood pressure 115/85, pulse 100, temperature 97.7 F (36.5 C), temperature source Oral, resp. rate 18, height 5\' 4"  (1.626 m), weight 93.1 kg, SpO2 100 %. No results found. No results for input(s): WBC, HGB, HCT, PLT in the last 72 hours. Recent Labs    08/20/18 0434  NA 138  K 3.9  CL 105  CO2 24  GLUCOSE 98  BUN 37*  CREATININE 1.07*  CALCIUM 8.8*    Physical Exam: BP 115/85 (BP Location: Left Arm)   Pulse 100   Temp 97.7 F (36.5 C) (Oral)   Resp 18   Ht 5\' 4"  (1.626 m)   Wt 93.1 kg   SpO2 100%   BMI 35.23 kg/m  Constitutional: No distress . Vital signs reviewed. HENT: Normocephalic.  Atraumatic. Eyes: EOMI. No discharge. Cardiovascular: RRR.  No JVD. Respiratory: Clear.  Normal effort. GI: BS +. Non-distended. Musc: No edema or tenderness in extremities. Neuro: Alert and oriented. Motor: Bilateral upper extremities: 4+/5 proximal distal, unchanged Bilateral lower extremities: Hip flexion 4+/5, knee extension 4+/5, ankle dorsiflexion 4+/5, unchanged Skin:Trach site healed Psych: Normal mood and behavior.   Assessment/Plan: 1. Functional deficits secondary to debility which require 3+ hours per day of interdisciplinary therapy in a comprehensive inpatient rehab setting.  Physiatrist is providing close team supervision and 24 hour management of active medical problems listed below.  Physiatrist and rehab team continue to assess barriers to discharge/monitor patient progress toward functional and medical goals  Care Tool:  Bathing    Body parts bathed by patient: Right arm, Left arm, Chest, Abdomen, Front perineal area, Right upper leg, Left upper leg, Face, Right lower leg, Left lower  leg   Body parts bathed by helper: Buttocks, Right lower leg, Left lower leg     Bathing assist Assist Level: Supervision/Verbal cueing(seated)     Upper Body Dressing/Undressing Upper body dressing   What is the patient wearing?: Hospital gown only    Upper body assist Assist Level: Supervision/Verbal cueing    Lower Body Dressing/Undressing Lower body dressing      What is the patient wearing?: Pants     Lower body assist Assist for lower body dressing: Contact Guard/Touching assist     Toileting Toileting Toileting Activity did not occur (Clothing management and hygiene only): Safety/medical concerns  Toileting assist Assist for toileting: Contact Guard/Touching assist     Transfers Chair/bed transfer  Transfers assist  Chair/bed transfer activity did not occur: Safety/medical concerns  Chair/bed transfer assist level: Supervision/Verbal cueing     Locomotion Ambulation   Ambulation assist      Assist level: Supervision/Verbal cueing Assistive device: Walker-rolling Max distance: 105   Walk 10 feet activity   Assist  Walk 10 feet activity did not occur: Safety/medical concerns  Assist level: Supervision/Verbal cueing Assistive device: Walker-rolling   Walk 50 feet activity   Assist Walk 50 feet with 2 turns activity did not occur: Safety/medical concerns  Assist level: Supervision/Verbal cueing Assistive device: Walker-rolling    Walk 150 feet activity   Assist Walk 150 feet activity did not occur: Safety/medical concerns         Walk 10 feet on uneven surface  activity   Assist Walk 10 feet on uneven surfaces activity did  not occur: Safety/medical concerns         Wheelchair     Assist   Type of Wheelchair: Manual    Wheelchair assist level: Independent Max wheelchair distance: 150    Wheelchair 50 feet with 2 turns activity    Assist        Assist Level: Independent   Wheelchair 150 feet activity      Assist Wheelchair 150 feet activity did not occur: Safety/medical concerns   Assist Level: Independent      Medical Problem List and Plan: 1.Debilitysecondary to acute on chronic respiratory failure. Patient not on prednisone prior to admission. Continue chronic oxygen 3 L as prior to admission. Patient does not have a routine pulmonologist and will need a referral  Continue CIR  Taper steroids reduced to 15mg  on 3/1, decreased again on 3/4, decreased again on 3/7 2. Antithrombotics: -DVT/anticoagulation:Coumadin for pulmonary emboli  INR therapeutic on 3/6, no lab draw today -antiplatelet therapy: ASA 81mg  qd 3. Pain Management:Voltaren as directed.Tylenol as needed  Robaxin as needed started on 2/28 4. Mood:Provide emotional support -antipsychotic agents: Clonazepam 0.5 mg 3 times a day 5. Neuropsych: This patientiscapable of making decisions on herown behalf. 6. Skin/Wound Care:Routine skin checks 7. Fluids/Electrolytes/Nutrition:Routine in and out's  8. 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 Dillard Cannon. Decannulated 9. Chronic aplastic anemia. Patient multiple transfusions in the past.   Hemoglobin 13.3 on 2/28 10. Diastolic congestive heart failure. Lasix 40 mg daily. Monitor for any signs of fluid overload Filed Weights   08/12/18 1400 08/15/18 0529 08/18/18 0630  Weight: 92.9 kg 93 kg 93.1 kg   Stable on 3/7 11. Hypertension Vitals:   08/21/18 0529 08/21/18 1400  BP: 106/73 115/85  Pulse: 90 100  Resp: 17 18  Temp: 98.3 F (36.8 C) 97.7 F (36.5 C)  SpO2: 99% 100%    Reduced amlodipine to 2.5 on 3/1, DC'd on 3/6  Cont current dose metoprolol 75mg  TID   Borderline hypotension on 3/7 12. Constipation. Laxative assistance 13.  Steroid-induced hyperglycemia  Labile on 3/6 14. Congestion  Mucinex started on 3/2  Improved 15.  Bowel urgency  Multifactorial with physiological component-happening only after eating  breakfast in the mornings  Encourage timed toileting in the a.m.  Improving 16.  AKI  Creatinine 1.07 on 3/6  Encourage fluids  LOS: 9 days A FACE TO FACE EVALUATION WAS PERFORMED   Karis Juba 08/21/2018, 3:10 PM

## 2018-08-21 NOTE — Progress Notes (Signed)
Physical Therapy Session Note  Patient Details  Name: Kelsey Jensen MRN: [id number] Date of Birth: May 01, 1951  Today's Date: 08/21/2018 PT Individual Time: 0845-1000 PT Individual Time Calculation (min): 75 min   Short Term Goals: Week 1:  PT Short Term Goal 1 (Week 1): Pt will perform sit<>stand with Supervision assist  PT Short Term Goal 1 - Progress (Week 1): Met PT Short Term Goal 2 (Week 1): Pt will transfers to [facility] with CGA consistently  PT Short Term Goal 2 - Progress (Week 1): Met PT Short Term Goal 3 (Week 1): Pt will propell WC >150 ft with supervision assist  PT Short Term Goal 3 - Progress (Week 1): Met PT Short Term Goal 4 (Week 1): Pt will ambulate 35f with min assist and LRAD  PT Short Term Goal 4 - Progress (Week 1): Met Week 2:  PT Short Term Goal 1 (Week 2): STG=LTG due to ELOS  Skilled Therapeutic Interventions/Progress Updates:   Pt received sitting in WC and agreeable to PT. [facility] with BUE support on 1 rail x 5 with CGA-Min assist from PT for safety. Cues for posture and LLE stability, pt noted to have R glute med weakness limiting pelvic stability.   Gait training with RW and supervision assist x [id number]+ [id number] noted to task short rest breaks and decreasing step length for last [id number] gait training.   Sit<>supine with supervision assist and multiple attempts to bring LLE onto bed through log roll.   Supine therex.  SAQ with 7lb ankle weights. 2x10 Hip flexion/extension with manual resistance. 2x10  Clam shells with level 2 tband. 2X 12 Bridges 2x 8  Isometric lumbar rotation. X 5 bil  Cues from PT for full ROM, improved diaphragmatic breathing, and proper speed to improve strengthening aspect.    Pt report pain in the L joint. Pt performed hip distractoin to reduce pain and improve joint mobilty. 3 x 1 min inferiorly. 2 x 30 sec anteriorly. Pt reports decreased pain/stiffness in L hip following  Manual therpay  [facility] returned to room and [facility] and all needs met.         Therapy Documentation Precautions:  Precautions Precautions: Fall Precaution Comments: O2 dependency Restrictions Weight Bearing Restrictions: No Pain: 3/ 10 L hip following stair training.    Therapy/Group: Individual Therapy  AustLorie Phenix/2020, 9:59 AM

## 2018-08-22 ENCOUNTER — Inpatient Hospital Stay (HOSPITAL_COMMUNITY): Payer: Medicare Other | Admitting: Occupational Therapy

## 2018-08-22 MED ORDER — GUAIFENESIN ER 600 MG PO TB12: 600.0000 mg | ORAL_TABLET | Freq: Two times a day (BID) | ORAL | Status: DC | PRN

## 2018-08-22 MED ORDER — MECLIZINE HCL 25 MG PO TABS: 25.0000 mg | ORAL_TABLET | Freq: Three times a day (TID) | ORAL | Status: DC | PRN

## 2018-08-22 NOTE — Progress Notes (Addendum)
[place] PHYSICAL MEDICINE & REHABILITATION PROGRESS NOTE  Subjective/Complaints: Patient seen sitting up in bed this morning.  She states she slept well overnight.  She is in good spirits.  ROS: Denies CP, SOB, N/V/D  Objective: Vital Signs: Blood pressure 98/75, pulse 88, temperature 97.8 F (36.6 C), resp. rate 16, height 5\' 4"  (1.626 m), weight 93.1 kg, SpO2 98 %. No results found. No results for input(s): WBC, HGB, HCT, PLT in the last 72 hours. Recent Labs    08/20/18 0434  NA 138  K 3.9  CL 105  CO2 24  GLUCOSE 98  BUN 37*  CREATININE 1.07*  CALCIUM 8.8*    Physical Exam: BP 98/75 (BP Location: Left Arm)   Pulse 88   Temp 97.8 F (36.6 C)   Resp 16   Ht 5\' 4"  (1.626 m)   Wt 93.1 kg   SpO2 98%   BMI 35.23 kg/m  Constitutional: No distress . Vital signs reviewed. HENT: Normocephalic.  Atraumatic. Eyes: EOMI. No discharge. Cardiovascular: RRR.  No JVD. Respiratory: Clear.  Normal effort. GI: BS +. Non-distended. Musc: No edema or tenderness in extremities. Neuro: Alert and oriented. Motor: Bilateral upper extremities: 4+/5 proximal distal, stable Bilateral lower extremities: Hip flexion 4+/5, knee extension 4+/5, ankle dorsiflexion 4+/5, stable Skin:Trach site healed Psych: Normal mood and behavior.   Assessment/Plan: 1. Functional deficits secondary to debility which require 3+ hours per day of interdisciplinary therapy in a comprehensive inpatient rehab setting.  Physiatrist is providing close team supervision and 24 hour management of active medical problems listed below.  Physiatrist and rehab team continue to assess barriers to discharge/monitor patient progress toward functional and medical goals  Care Tool:  Bathing  Bathing activity did not occur: Refused Body parts bathed by patient: Right arm, Left arm, Chest, Abdomen, Front perineal area, Right upper leg, Left upper leg, Face, Right lower leg, Left lower leg   Body parts bathed by  helper: Buttocks, Right lower leg, Left lower leg     Bathing assist Assist Level: Supervision/Verbal cueing(seated)     Upper Body Dressing/Undressing Upper body dressing Upper body dressing/undressing activity did not occur (including orthotics): Refused What is the patient wearing?: Hospital gown only    Upper body assist Assist Level: Supervision/Verbal cueing    Lower Body Dressing/Undressing Lower body dressing    Lower body dressing activity did not occur: Refused What is the patient wearing?: Pants     Lower body assist Assist for lower body dressing: Independent     Toileting Toileting Toileting Activity did not occur (Clothing management and hygiene only): Safety/medical concerns  Toileting assist Assist for toileting: Supervision/Verbal cueing     Transfers Chair/bed transfer  Transfers assist  Chair/bed transfer activity did not occur: Safety/medical concerns  Chair/bed transfer assist level: Supervision/Verbal cueing     Locomotion Ambulation   Ambulation assist      Assist level: Supervision/Verbal cueing Assistive device: Walker-rolling Max distance: 105   Walk 10 feet activity   Assist  Walk 10 feet activity did not occur: Safety/medical concerns  Assist level: Supervision/Verbal cueing Assistive device: Walker-rolling   Walk 50 feet activity   Assist Walk 50 feet with 2 turns activity did not occur: Safety/medical concerns  Assist level: Supervision/Verbal cueing Assistive device: Walker-rolling    Walk 150 feet activity   Assist Walk 150 feet activity did not occur: Safety/medical concerns         Walk 10 feet on uneven surface  activity   Assist  Walk 10 feet on uneven surfaces activity did not occur: Safety/medical concerns         Wheelchair     Assist   Type of Wheelchair: Manual    Wheelchair assist level: Independent Max wheelchair distance: 150    Wheelchair 50 feet with 2 turns  activity    Assist        Assist Level: Independent   Wheelchair 150 feet activity     Assist Wheelchair 150 feet activity did not occur: Safety/medical concerns   Assist Level: Independent      Medical Problem List and Plan: 1.Debilitysecondary to acute on chronic respiratory failure. Patient not on prednisone prior to admission. Continue chronic oxygen 3 L as prior to admission. Patient does not have a routine pulmonologist and will need a referral  Continue CIR  Taper steroids reduced to 15mg  on 3/1, decreased again on 3/4, decreased again on 3/7 2. Antithrombotics: -DVT/anticoagulation:Coumadin for pulmonary emboli  INR therapeutic on 3/6, no lab draw today -antiplatelet therapy: ASA 81mg  qd 3. Pain Management:Voltaren as directed.Tylenol as needed  Robaxin as needed started on 2/28 4. Mood:Provide emotional support -antipsychotic agents: Clonazepam 0.5 mg 3 times a day 5. Neuropsych: This patientiscapable of making decisions on herown behalf. 6. Skin/Wound Care:Routine skin checks 7. Fluids/Electrolytes/Nutrition:Routine in and out's  8. 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 Chronic aplastic anemia. Patient multiple transfusions in the past.   Hemoglobin 13.3 on 2/28 10. Diastolic congestive heart failure. Lasix 40 mg daily. Monitor for any signs of fluid overload Filed Weights   08/12/18 1400 08/15/18 0529 08/18/18 0630  Weight: 92.9 kg 93 kg 93.1 kg   Stable on 3/8 11. Hypertension Vitals:   08/21/18 2242 08/22/18 0602  BP: 91/73 98/75  Pulse: 89 88  Resp: 18 16  Temp: (!) 97.5 F (36.4 C) 97.8 F (36.6 C)  SpO2: 98% 98%    Reduced amlodipine to 2.5 on 3/1, DC'd on 3/6  Cont current dose metoprolol 75mg  TID   Relatively controlled on 3/8 12. Constipation. Laxative assistance 13.  Steroid-induced hyperglycemia  Labile on 3/6 14.  Congestion  Mucinex started on 3/2  Improved 15.  Bowel urgency  Multifactorial with physiological component-happening only after eating breakfast in the mornings  Encourage timed toileting in the a.m.  Improving 16.  AKI  Creatinine 1.07 on 3/6  Encourage fluids  LOS: 10 days A FACE TO FACE EVALUATION WAS PERFORMED   Karis Juba 08/22/2018, 6:40 PM

## 2018-08-22 NOTE — Progress Notes (Addendum)
Occupational Therapy Session Note  Patient Details  Name: Kelsey Jensen MRN: [id number] Date of Birth: [birth year]  Today's Date: 08/22/2018 OT Individual Time: 0929-5747 OT Individual Time Calculation (min): 77 min   Short Term Goals: Week 2:  OT Short Term Goal 1 (Week 2): Continue working on established supervision goals in preparation for discharge.      Skilled Therapeutic Interventions/Progress Updates:    Pt greeted in w/c, reporting pain to be manageable for tx. ADL needs met. Escorted pt via w/c to dayroom and worked on sit<stands, standing endurance, and activity tolerance while maintaining stable vitals. She engaged in [employer] of bowling and just dance. She stood several times during bowling with vcs for hand placement during sit<stand transitions with RW. Pt able to stand for 2-3 minute windows with supervision for balance. Longest standing window 3 minutes 20 seconds. 02 sats 98-100% on 3L portable 02 when assessed during seated rest. Discussed diaphragmatic breathing techniques using primary vs accessory respiratory muscles to improve ease and quality of breathing. Provided her with lavender to use via inhalation to encourage deep breathing before bedtime (with pt consent). Next pt engaged in dancing while seated, coordinating UEs and LEs together to mirror dancers on screen. Vcs required for remembering to breathe and to engage muscles of trunk. 02 sats still <97% during seated rest. When pt was returned to room, she reported that she had to use the bathroom. Ambulatory transfer to toilet completed using RW with supervision assist while OT managed 02 tank. Supervision for toileting tasks, and for transfer back to w/c to wash hands at sink. Pt was set up to finish eating lunch, left in care of RN.  Therapy Documentation Precautions:  Precautions Precautions: Fall Precaution Comments: O2 dependency Restrictions Weight Bearing Restrictions: No ADL: ADL Eating: Set up Grooming:  Setup Upper Body Bathing: Setup Where Assessed-Upper Body Bathing: Edge of bed Lower Body Bathing: Moderate assistance Where Assessed-Lower Body Bathing: Edge of bed Upper Body Dressing: Setup Where Assessed-Upper Body Dressing: Edge of bed Lower Body Dressing: Minimal assistance Where Assessed-Lower Body Dressing: Edge of bed     Therapy/Group: Individual Therapy  Maecie Sevcik A Lovelle Deitrick 08/22/2018, 3:32 PM

## 2018-08-22 NOTE — Progress Notes (Signed)
Held lopressor r/t BP/P see flowsheet

## 2018-08-23 ENCOUNTER — Inpatient Hospital Stay (HOSPITAL_COMMUNITY): Payer: Medicare Other | Admitting: Physical Therapy

## 2018-08-23 ENCOUNTER — Inpatient Hospital Stay (HOSPITAL_COMMUNITY): Payer: Medicare Other | Admitting: Occupational Therapy

## 2018-08-23 LAB — PROTIME-INR
INR: 2.1 — ABNORMAL HIGH (ref 0.8–1.2)
Prothrombin Time: 22.9 seconds — ABNORMAL HIGH (ref 11.4–15.2)

## 2018-08-23 MED ORDER — PREDNISONE 2.5 MG PO TABS
2.5000 mg | ORAL_TABLET | Freq: Every day | ORAL | Status: DC
  Administered 2018-08-24 – 2018-08-25 (×2): 2.5 mg via ORAL
  Filled 2018-08-23 (×2): qty 1

## 2018-08-23 NOTE — Progress Notes (Signed)
Occupational Therapy Session Note  Patient Details  Name: Kelsey Jensen MRN: [id number] Date of Birth: [birth year]  Today's Date: 08/23/2018 OT Individual Time: 1561-5379 OT Individual Time Calculation (min): 71 min    Short Term Goals: Week 2:  OT Short Term Goal 1 (Week 2): Continue working on established supervision goals in preparation for discharge.    Skilled Therapeutic Interventions/Progress Updates:    Pt worked on sit to stand transitions from the wheelchair as well as standing endurance with use of the RW for support.  She needed min assist for sit to stand on some trials when she did not push up from the arms of the wheelchair with both hands.  When she pushed up with both UEs, she could complete this with close supervision.  Standing endurance lasted for 5 mins for longest interval and 2-3 mins for the majority of trials with close supervision while engaged in Wii activity.  Next had pt work on BUE strengthening with AAROM shoulder flexion for 2 sets of 5 repetitions.  She also completed 3 sets of 10 reps for bilateral triceps extension as well as 2 sets of 10 for shoulder extension with use of the level 1 resistance band.  Finished session with transfer back to the room via wheelchair with pt left sitting up in the wheelchair with call button and phone in reach and safety alarm belt in place.  Pt on 2 Ls O2 throughout session with all activity.  Oxygen sats were at 93% or greater throughout.    Therapy Documentation Precautions:  Precautions Precautions: Fall Precaution Comments: O2 dependency Restrictions Weight Bearing Restrictions: No  Pain: Pain Assessment Pain Scale: Faces Faces Pain Scale: Hurts a little bit Pain Type: Acute pain Pain Location: Shoulder Pain Orientation: Right Pain Descriptors / Indicators: Discomfort Pain Intervention(s): Repositioned  Therapy/Group: Individual Therapy  Saahas Hidrogo OTR/L 08/23/2018, 3:34 PM

## 2018-08-23 NOTE — Progress Notes (Signed)
Occupational Therapy Session Note  Patient Details  Name: Kelsey Jensen MRN: [id number] Date of Birth: 04-30-1951  Today's Date: 08/23/2018 OT Individual Time: 0800-0901 OT Individual Time Calculation (min): 61 min    Short Term Goals: Week 2:  OT Short Term Goal 1 (Week 2): Continue working on established supervision goals in preparation for discharge.    Skilled Therapeutic Interventions/Progress Updates:    Pt completed bathing, dressing, and grooming during am session.  Supervision for supine to sit with HOB elevated, as pt reports having a hospital bed at home.  She was able to ambulated with use of the RW for support and min guard assist to gather washcloths, towels, and clothing for bathing at the sink.  She then sat in the wheelchair with min assist for washing her back only, and completed her bathing.  No LB bathing completed per pt's choice, but she was able to complete donning pants and gripper socks with min assist and use of the sockaide with setup only.  Grooming tasks of oral hygiene completed with supervision.  Mod assist to put her hair back in a ponytail after she brushed it out.  Finished session with functional mobility in the hallway and back to the room without O2 and with use of the RW 45'.  Min guard with increased time for resting in standing to complete task.  Oxygen sats 93% on room air with HR increasing to 108.  Pt left in wheelchair with call button and phone in reach.  Chair alarm and safety belt also in place.   Therapy Documentation Precautions:  Precautions Precautions: Fall Precaution Comments: O2 dependency Restrictions Weight Bearing Restrictions: No   Vital Signs: Therapy Vitals Temp: (!) 97.5 F (36.4 C) Pulse Rate: (!) 103 Resp: 18 BP: 100/72 Patient Position (if appropriate): Lying Oxygen Therapy SpO2: 93 % O2 Device: Room Air Patient Activity (if Appropriate): Ambulating Pulse Oximetry Type: Intermittent Pain: Pain Assessment Pain  Scale: 0-10 Pain Score: 0-No pain ADL: See Care Tool Section for some details of ADL  Therapy/Group: Individual Therapy  Marcellino Fidalgo OTR/L 08/23/2018, 9:00 AM

## 2018-08-23 NOTE — Progress Notes (Signed)
ANTICOAGULATION CONSULT NOTE - Follow Up Consult  Pharmacy Consult for Warfarin Indication: pulmonary embolus/afib  Allergies  Allergen Reactions  . Sulfa Antibiotics Nausea And Vomiting    Patient Measurements: Height: 5\' 4"  (162.6 cm) Weight: 205 lb 4 oz (93.1 kg) IBW/kg (Calculated) : 54.7  Vital Signs: Temp: 97.5 F (36.4 C) (03/09 0541) BP: 100/72 (03/09 0541) Pulse Rate: 58 (03/09 0541)  Labs: Recent Labs    08/23/18 0611  LABPROT 22.9*  INR 2.1*    Estimated Creatinine Clearance: 54.1 mL/min (A) (by C-G formula based on SCr of 1.07 mg/dL (H)).  Assessment: 68 year old female transferred from [facility] Started on warfarin 08/03/18 for PE. INR remains stable on her current regimen and within the therapeutic range. She was previously not on coumadin prior to the PE due to hx GIB and low hgb.  INR cont to be therapeutic and is being monitor on MWF.   Previous dosing regimen as follows: 2/21 INR 1.10 -> 7.5 mg 2/22 INR 1.2 -> 7.5 mg 2/23 INR 1.52 - > 10 mg 2/24 INR 1.89 -> 10 mg 2/25 INR 2.8 -> 5 mg 2/26 INR 2.6 -> 5 mg 2/27 INR 2.7 2/28 INR 2.9 2/29 INR 3.0 3/1 INR 3.6  Goal of Therapy:  INR 2-3 Monitor platelets by anticoagulation protocol: Yes   Plan:  Coumadin 4mg  PO qday INR MWF  Ulyses Southward, PharmD, BCIDP, AAHIVP, CPP Infectious Disease Pharmacist 08/23/2018 8:26 AM

## 2018-08-23 NOTE — Progress Notes (Signed)
[place] PHYSICAL MEDICINE & REHABILITATION PROGRESS NOTE  Subjective/Complaints: Patient seen sitting up in a chair working with therapies this morning.  She states she slept well overnight.  She denies complaints.  ROS: Denies CP, SOB, N/V/D  Objective: Vital Signs: Blood pressure 100/72, pulse (!) 103, temperature (!) 97.5 F (36.4 C), resp. rate 18, height 5\' 4"  (1.626 m), weight 93.1 kg, SpO2 93 %. No results found. No results for input(s): WBC, HGB, HCT, PLT in the last 72 hours. No results for input(s): NA, K, CL, CO2, GLUCOSE, BUN, CREATININE, CALCIUM in the last 72 hours.  Physical Exam: BP 100/72 (BP Location: Left Arm)   Pulse (!) 103   Temp (!) 97.5 F (36.4 C)   Resp 18   Ht 5\' 4"  (1.626 m)   Wt 93.1 kg   SpO2 93%   BMI 35.23 kg/m  Constitutional: No distress . Vital signs reviewed. HENT: Normocephalic.  Atraumatic. Eyes: EOMI. No discharge. Cardiovascular: RRR.  No JVD. Respiratory: Clear.  Normal effort. GI: BS +. Non-distended. Musc: No edema or tenderness in extremities. Neuro: Alert and oriented. Motor: Bilateral upper extremities: 4+/5 proximal distal, unchanged Bilateral lower extremities: Hip flexion 4+/5, knee extension 4+/5, ankle dorsiflexion 4+/5, unchanged Skin:Trach site healed Psych: Normal mood and behavior.   Assessment/Plan: 1. Functional deficits secondary to debility which require 3+ hours per day of interdisciplinary therapy in a comprehensive inpatient rehab setting.  Physiatrist is providing close team supervision and 24 hour management of active medical problems listed below.  Physiatrist and rehab team continue to assess barriers to discharge/monitor patient progress toward functional and medical goals  Care Tool:  Bathing  Bathing activity did not occur: Refused Body parts bathed by patient: Right arm, Left arm, Chest, Abdomen, Front perineal area, Face   Body parts bathed by helper: Buttocks, Right lower leg, Left lower  leg Body parts n/a: Buttocks, Right upper leg, Left upper leg, Right lower leg, Left lower leg(did not attempt)   Bathing assist Assist Level: Supervision/Verbal cueing(seated)     Upper Body Dressing/Undressing Upper body dressing Upper body dressing/undressing activity did not occur (including orthotics): Refused What is the patient wearing?: Pull over shirt    Upper body assist Assist Level: Set up assist    Lower Body Dressing/Undressing Lower body dressing    Lower body dressing activity did not occur: Refused What is the patient wearing?: Pants     Lower body assist Assist for lower body dressing: Contact Guard/Touching assist     Toileting Toileting Toileting Activity did not occur (Clothing management and hygiene only): Safety/medical concerns  Toileting assist Assist for toileting: Supervision/Verbal cueing     Transfers Chair/bed transfer  Transfers assist  Chair/bed transfer activity did not occur: Safety/medical concerns  Chair/bed transfer assist level: Contact Guard/Touching assist     Locomotion Ambulation   Ambulation assist      Assist level: Contact Guard/Touching assist Assistive device: Walker-rolling Max distance: 45'   Walk 10 feet activity   Assist  Walk 10 feet activity did not occur: Safety/medical concerns  Assist level: Supervision/Verbal cueing Assistive device: Walker-rolling   Walk 50 feet activity   Assist Walk 50 feet with 2 turns activity did not occur: Safety/medical concerns  Assist level: Supervision/Verbal cueing Assistive device: Walker-rolling    Walk 150 feet activity   Assist Walk 150 feet activity did not occur: Safety/medical concerns         Walk 10 feet on uneven surface  activity   Assist Walk  10 feet on uneven surfaces activity did not occur: Safety/medical concerns         Wheelchair     Assist   Type of Wheelchair: Manual    Wheelchair assist level: Independent Max  wheelchair distance: 150    Wheelchair 50 feet with 2 turns activity    Assist        Assist Level: Independent   Wheelchair 150 feet activity     Assist Wheelchair 150 feet activity did not occur: Safety/medical concerns   Assist Level: Independent      Medical Problem List and Plan: 1.Debilitysecondary to acute on chronic respiratory failure. Patient not on prednisone prior to admission. Continue chronic oxygen 3 L as prior to admission. Patient does not have a routine pulmonologist and will need a referral  Continue CIR  Taper steroids reduced to 15mg  on 3/1, decreased again on 3/4, decreased again on 3/7, decreased again on 3/10 2. Antithrombotics: -DVT/anticoagulation:Coumadin for pulmonary emboli  INR therapeutic on 3/9 -antiplatelet therapy: ASA 81mg  qd 3. Pain Management:Voltaren as directed.Tylenol as needed  Robaxin as needed started on 2/28 4. Mood:Provide emotional support -antipsychotic agents: Clonazepam 0.5 mg 3 times a day 5. Neuropsych: This patientiscapable of making decisions on herown behalf. 6. Skin/Wound Care:Routine skin checks 7. Fluids/Electrolytes/Nutrition:Routine in and out's  8. 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 Chronic aplastic anemia. Patient multiple transfusions in the past.   Hemoglobin 13.3 on 2/28   Labs ordered for tomorrow 10. Diastolic congestive heart failure. Lasix 40 mg daily. Monitor for any signs of fluid overload Filed Weights   08/12/18 1400 08/15/18 0529 08/18/18 0630  Weight: 92.9 kg 93 kg 93.1 kg   Stable on 3/9 11. Hypertension Vitals:   08/23/18 0541 08/23/18 0855  BP: 100/72   Pulse: (!) 58 (!) 103  Resp: 18   Temp: (!) 97.5 F (36.4 C)   SpO2: 98% 93%    Reduced amlodipine to 2.5 on 3/1, DC'd on 3/6  Cont current dose metoprolol 75mg  TID   Relatively controlled on 3/9 12. Constipation. Laxative  assistance 13.  Steroid-induced hyperglycemia  Improving 14. Congestion  Mucinex started on 3/2  Improved 15.  Bowel urgency  Multifactorial with physiological component-happening only after eating breakfast in the mornings  Encourage timed toileting in the a.m.  Improving 16.  AKI  Creatinine 1.07 on 3/6  Encourage fluids  LOS: 11 days A FACE TO FACE EVALUATION WAS PERFORMED  [place] Karis Juba 08/23/2018, 10:30 AM

## 2018-08-23 NOTE — Progress Notes (Signed)
Physical Therapy Session Note  Patient Details  Name: Kelsey Jensen MRN: [id number] Date of Birth: Apr 07, 1951  Today's Date: 08/23/2018 PT Individual Time: 0800-0915 PT Individual Time Calculation (min): 75 min   Short Term Goals: Week 2:  PT Short Term Goal 1 (Week 2): STG=LTG due to ELOS  Skilled Therapeutic Interventions/Progress Updates: Pt presented in in w/c agreeable to therapy. Pt stating some pain in hip/groin not needing pain meds. Pt propelled to rehab gym supervision with intermittent breaks for endurance and BUE strengthening. Participated in stair training ascending B rail and increased time. Descended backwards with B rails. Participated in seated and standing LE therex as follows.  Seated: Ankle pumps  x20 LAQ x30 Hip flexion WLP x15 Hamstring pulls with level 1 resistance band x 15  Standing in parallel bars: SLR Hip abd/add Mini squats   Pt with increase c/o pain L hip/groin with standing activities limiting activity as was only able to tolerate gait approx 38f with RW and CGA. Discussed with pt positional changes and pt agreeable to trial recliner once back in room. Pt transported back to room and performed stand pivot to recliner. Pt left in chair with legs elevated, heating pack positioned at groin and all needs met.      Therapy Documentation Precautions:  Precautions Precautions: Fall Precaution Comments: O2 dependency Restrictions Weight Bearing Restrictions: No General:   Vital Signs: Therapy Vitals Temp: 97.9 F (36.6 C) Temp Source: Oral Pulse Rate: 94 Resp: 18 BP: 94/74 Patient Position (if appropriate): Sitting Oxygen Therapy SpO2: 95 % O2 Device: Room Air    Therapy/Group: Individual Therapy  Tondalaya Perren  Terril Amaro, PTA  08/23/2018, 2:34 PM

## 2018-08-24 ENCOUNTER — Inpatient Hospital Stay (HOSPITAL_COMMUNITY): Payer: Medicare Other

## 2018-08-24 ENCOUNTER — Inpatient Hospital Stay (HOSPITAL_COMMUNITY): Payer: Medicare Other | Admitting: Physical Therapy

## 2018-08-24 ENCOUNTER — Inpatient Hospital Stay (HOSPITAL_COMMUNITY): Payer: Medicare Other | Admitting: Occupational Therapy

## 2018-08-24 LAB — CBC WITH DIFFERENTIAL/PLATELET
Abs Immature Granulocytes: 0.07 10*3/uL (ref 0.00–0.07)
Basophils Absolute: 0.1 10*3/uL (ref 0.0–0.1)
Basophils Relative: 1 %
Eosinophils Absolute: 0.1 10*3/uL (ref 0.0–0.5)
Eosinophils Relative: 2 %
HCT: 40.3 % (ref 36.0–46.0)
Hemoglobin: 12.4 g/dL (ref 12.0–15.0)
IMMATURE GRANULOCYTES: 1 %
Lymphocytes Relative: 16 %
Lymphs Abs: 1.2 10*3/uL (ref 0.7–4.0)
MCH: 30.3 pg (ref 26.0–34.0)
MCHC: 30.8 g/dL (ref 30.0–36.0)
MCV: 98.5 fL (ref 80.0–100.0)
Monocytes Absolute: 0.8 10*3/uL (ref 0.1–1.0)
Monocytes Relative: 11 %
NEUTROS PCT: 69 %
Neutro Abs: 5.2 10*3/uL (ref 1.7–7.7)
PLATELETS: 270 10*3/uL (ref 150–400)
RBC: 4.09 MIL/uL (ref 3.87–5.11)
RDW: 21.7 % — ABNORMAL HIGH (ref 11.5–15.5)
WBC: 7.5 10*3/uL (ref 4.0–10.5)
nRBC: 0 % (ref 0.0–0.2)

## 2018-08-24 NOTE — Discharge Instructions (Signed)
Inpatient Rehab Discharge Instructions  Kelsey Jensen Discharge date and time: No discharge date for patient encounter.   Activities/Precautions/ Functional Status: Activity: activity as tolerated Diet: soft Wound Care: none needed Functional status:  ___ No restrictions     ___ Walk up steps independently ___ 24/7 supervision/assistance   ___ Walk up steps with assistance ___ Intermittent supervision/assistance  ___ Bathe/dress independently ___ Walk with walker     __x_ Bathe/dress with assistance ___ Walk Independently    ___ Shower independently ___ Walk with assistance    ___ Shower with assistance ___ No alcohol     ___ Return to work/school ________    COMMUNITY REFERRALS UPON DISCHARGE:    Home Health:   PT     OT    RN                      Agency:  [facility] Phone: [phone]  Medical Equipment/Items Ordered:  Wheelchair, cushion                                                      Agency/Supplier:  [employer]  @ [phone]      Special Instructions:  Continue chronic oxygen therapy as prior to admission.  Home health nurse to check INR on  08/27/2018     Results to [facility] practice phone number [phone] fax number [phone]  My questions have been answered and I understand these instructions. I will adhere to these goals and the provided educational materials after my discharge from the hospital.  Patient/Caregiver Signature _______________________________ Date __________  Clinician Signature _______________________________________ Date __________  Please bring this form and your medication list with you to all your follow-up doctor's appointments.   Information on my medicine - Coumadin   (Warfarin)  This medication education was reviewed with me or my healthcare representative as part of my discharge preparation.  The pharmacist that spoke with me during my hospital stay was:  Ulyses Southward, RPH-CPP  Why was Coumadin  prescribed for you? Coumadin was prescribed for you because you have a blood clot or a medical condition that can cause an increased risk of forming blood clots. Blood clots can cause serious health problems by blocking the flow of blood to the heart, lung, or brain. Coumadin can prevent harmful blood clots from forming. As a reminder your indication for Coumadin is:   Pulmonary Embolism Treatment  What test will check on my response to Coumadin? While on Coumadin (warfarin) you will need to have an INR test regularly to ensure that your dose is keeping you in the desired range. The INR (international normalized ratio) number is calculated from the result of the laboratory test called prothrombin time (PT).  If an INR APPOINTMENT HAS NOT ALREADY BEEN MADE FOR YOU please schedule an appointment to have this lab work done by your health care provider within 7 days. Your INR goal is usually a number between:  2 to 3 or your provider may give you a more narrow range like 2-2.5.  Ask your health care provider during an office visit what your goal INR is.  What  do you need to  know  About  COUMADIN? Take Coumadin (warfarin) exactly as prescribed by your healthcare provider about the same time each day.  DO  NOT stop taking without talking to the doctor who prescribed the medication.  Stopping without other blood clot prevention medication to take the place of Coumadin may increase your risk of developing a new clot or stroke.  Get refills before you run out.  What do you do if you miss a dose? If you miss a dose, take it as soon as you remember on the same day then continue your regularly scheduled regimen the next day.  Do not take two doses of Coumadin at the same time.  Important Safety Information A possible side effect of Coumadin (Warfarin) is an increased risk of bleeding. You should call your healthcare provider right away if you experience any of the following: ? Bleeding from an injury or your  nose that does not stop. ? Unusual colored urine (red or dark brown) or unusual colored stools (red or black). ? Unusual bruising for unknown reasons. ? A serious fall or if you hit your head (even if there is no bleeding).  Some foods or medicines interact with Coumadin (warfarin) and might alter your response to warfarin. To help avoid this: ? Eat a balanced diet, maintaining a consistent amount of Vitamin K. ? Notify your provider about major diet changes you plan to make. ? Avoid alcohol or limit your intake to 1 drink for women and 2 drinks for men per day. (1 drink is 5 oz. wine, 12 oz. beer, or 1.5 oz. liquor.)  Make sure that ANY health care provider who prescribes medication for you knows that you are taking Coumadin (warfarin).  Also make sure the healthcare provider who is monitoring your Coumadin knows when you have started a new medication including herbals and non-prescription products.  Coumadin (Warfarin)  Major Drug Interactions  Increased Warfarin Effect Decreased Warfarin Effect  Alcohol (large quantities) Antibiotics (esp. Septra/Bactrim, Flagyl, Cipro) Amiodarone (Cordarone) Aspirin (ASA) Cimetidine (Tagamet) Megestrol (Megace) NSAIDs (ibuprofen, naproxen, etc.) Piroxicam (Feldene) Propafenone (Rythmol SR) Propranolol (Inderal) Isoniazid (INH) Posaconazole (Noxafil) Barbiturates (Phenobarbital) Carbamazepine (Tegretol) Chlordiazepoxide (Librium) Cholestyramine (Questran) Griseofulvin Oral Contraceptives Rifampin Sucralfate (Carafate) Vitamin K   Coumadin (Warfarin) Major Herbal Interactions  Increased Warfarin Effect Decreased Warfarin Effect  Garlic Ginseng Ginkgo biloba Coenzyme Q10 Green tea St. Johns wort    Coumadin (Warfarin) FOOD Interactions  Eat a consistent number of servings per week of foods HIGH in Vitamin K (1 serving =  cup)  Collards (cooked, or boiled & drained) Kale (cooked, or boiled & drained) Mustard greens (cooked, or  boiled & drained) Parsley *serving size only =  cup Spinach (cooked, or boiled & drained) Swiss chard (cooked, or boiled & drained) Turnip greens (cooked, or boiled & drained)  Eat a consistent number of servings per week of foods MEDIUM-HIGH in Vitamin K (1 serving = 1 cup)  Asparagus (cooked, or boiled & drained) Broccoli (cooked, boiled & drained, or raw & chopped) Brussel sprouts (cooked, or boiled & drained) *serving size only =  cup Lettuce, raw (green leaf, endive, romaine) Spinach, raw Turnip greens, raw & chopped   These websites have more information on Coumadin (warfarin):  http://www.king-russell.com/; https://www.hines.net/;

## 2018-08-24 NOTE — Progress Notes (Signed)
Physical Therapy Session Note  Patient Details  Name: Kelsey Jensen MRN: [id number] Date of Birth: 01-Apr-1951  Today's Date: 08/24/2018 PT Individual Time: 1000-1110 PT Individual Time Calculation (min): 70 min   Short Term Goals: Week 2:  PT Short Term Goal 1 (Week 2): STG=LTG due to ELOS  Skilled Therapeutic Interventions/Progress Updates:    Pt seated in w/c upon PT arrival, agreeable to therapy tx and reports pain up to 9/10 at end of session following activity and exercises, applied ice at end of session for pain relief. Pt transferred from w/c<>bed this session with RW and supervision in order to work on bed mobility. Bed set up to simulate home set up, performed bed mobility without rails and CGA for sit>supine for L LE management and performed supine>sit without assist. Pt transported to the gym in w/c. Pt ascended/descended 3 steps this session with L rail to simulate home set up, pt required min assist and performed step to pattern with both UEs on single rail for UE support. Pt transported to rehab apartment and ambulated x 5 ft to the recliner with RW and supervision, performed recliner transfer with min assist to boost up from lower surface. Educated pt on being aware of sitting in lower surfaces at home, may require increased assist to get up from lower surfaces. Pt transported to ortho gym and performed car transfer with RW and supervision, ambulated x 5 ft to the car. Pt propelled w/c x 150 ft independently using B UEs. Pt performed stand pivot to mat this session with RW and supervision. Pt performed bed mobility on mat this session with min assist. In supine worked on repeated hip flexion bilaterally x 15 each LE for strengthening. Pt performed manual resisted DF on L ankle x 12 for strengthening. Pt transported back to room at end of session and left in w/c with needs in reach and chair alarm set, ice applied for pain relief.   Therapy Documentation Precautions:   Precautions Precautions: Fall Precaution Comments: O2 dependency Restrictions Weight Bearing Restrictions: No    Therapy/Group: Individual Therapy  Cresenciano Genre, PT, DPT 08/24/2018, 8:00 AM

## 2018-08-24 NOTE — Discharge Summary (Addendum)
Physician Discharge Summary  Patient ID: Kelsey Jensen MRN: [id number] DOB/AGE: September 03, 1950 68 y.o.  Admit date: 08/12/2018 Discharge date: 08/25/2018  Discharge Diagnoses:  Active Problems:   Debility   Supplemental oxygen dependent   Essential hypertension   Aplastic anemia (HCC)   Acute pain of left knee   Pulmonary embolism (HCC)   Congestion of both ears   Steroid-induced hyperglycemia   Benign essential HTN   Chronic diastolic (congestive) heart failure (HCC)   AKI (acute kidney injury) (HCC)   Discharged Condition: stable  Significant Diagnostic Studies: Ct Chest W Contrast  Addendum Date: 08/03/2018   ADDENDUM REPORT: 08/03/2018 08:29 ADDENDUM: The findings were called to Dr. Odis Luster by myself shortly after the study was performed. Electronically Signed   By: Gerome Sam III M.D   On: 08/03/2018 08:29   Result Date: 08/03/2018 CLINICAL DATA:  Sepsis. EXAM: CT CHEST WITH CONTRAST TECHNIQUE: Multidetector CT imaging of the chest was performed during intravenous contrast administration. CONTRAST:  104mL OMNIPAQUE IOHEXOL 300 MG/ML  SOLN COMPARISON:  Chest x-ray July 29, 2018 FINDINGS: Cardiovascular: Cardiomegaly is noted. Coronary artery calcifications are identified. The ascending thoracic aorta measures 4.1 cm in AP dimension. Mild atherosclerotic change. No dissection. Pulmonary emboli involve the right upper, middle, and anterior lower lobe branches. There is also involvement on the left but less than seen on the right. The right ventricle measures 5.1 cm in diameter in the left measures 3.8 cm. With a RV/LV ratio of 1.34. Mediastinum/Nodes: There is a moderate hiatal hernia. The esophagus and thyroid are otherwise normal. No adenopathy. No effusion. Lungs/Pleura: Central airways are normal. No pneumothorax. There is opacity in the medial right lung base with air bronchograms. There is more mild opacity in the medial aspect of the left upper and lower lobes. No nodules or  masses. Upper Abdomen: There is a small cyst in the right kidney. Upper abdomen is otherwise unremarkable. Musculoskeletal: No chest wall abnormality. No acute or significant osseous findings. IMPRESSION: 1. Bilateral lobar pulmonary emboli, right greater than left, with an RV/LV ratio of 1.34 consistent with right heart strain. Positive for acute PE with CT evidence of right heart strain (RV/LV Ratio = 1.34) consistent with at least submassive (intermediate risk) PE. The presence of right heart strain has been associated with an increased risk of morbidity and mortality. Please activate Code PE by paging [phone]. 2. Medial bibasilar opacities, right greater than left may represent atelectasis. However, infiltrate/pneumonia not excluded on the right. Infarct considered less likely on the right. 3. Cardiomegaly.  Coronary artery calcifications. 4. 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 5. Atherosclerotic change in the thoracic aorta. 6. Moderate hiatal hernia. Findings being called to the referring clinical team. Aortic Atherosclerosis (ICD10-I70.0). Electronically Signed: By: Gerome Sam III M.D On: 08/01/2018 16:07   Dg Chest Port 1 View  Result Date: 08/09/2018 CLINICAL DATA:  Respiratory failure, hypoxia. EXAM: PORTABLE CHEST 1 VIEW COMPARISON:  Radiograph of July 29, 2018. FINDINGS: Stable cardiomegaly. No pneumothorax or pleural effusion is noted. Mild right basilar opacity is noted concerning for pneumonia or atelectasis. Bony thorax is unremarkable. IMPRESSION: Mild right basilar pneumonia or atelectasis. Followup PA and lateral chest X-ray is recommended in 3-4 weeks following trial of antibiotic therapy to ensure resolution and exclude underlying malignancy. Electronically Signed   By: Lupita Raider, M.D.   On: 08/09/2018 12:27   Dg Chest Port 1 View  Result Date: 07/29/2018 CLINICAL DATA:  Oxygen desaturation EXAM: PORTABLE  CHEST 1 VIEW COMPARISON:   Portable exam 1047 hours compared to 07/26/2018 FINDINGS: Enlargement of cardiac silhouette. Atherosclerotic calcification aorta. Mediastinal contours and pulmonary vascularity normal. Streaky atelectasis at medial LEFT upper lobe. Lungs otherwise clear. No acute infiltrate, pleural effusion or pneumothorax. Bones demineralized. IMPRESSION: Enlargement of cardiac silhouette with streaky atelectasis in LEFT upper lobe. Electronically Signed   By: Ulyses SouthwardMark  Boles M.D.   On: 07/29/2018 12:32   Dg Chest Port 1 View  Result Date: 07/26/2018 CLINICAL DATA:  Evaluate for congestive heart failure. EXAM: PORTABLE CHEST 1 VIEW COMPARISON:  07/03/2018 FINDINGS: Midline trachea. Atherosclerosis in the transverse aorta. Moderate cardiomegaly. No pleural effusion or pneumothorax. Low lung volumes, without congestive failure or lobar consolidation. Suspect mild right infrahilar subsegmental atelectasis. Degenerate changes of both shoulders. IMPRESSION: Cardiomegaly, without congestive failure or acute disease. Electronically Signed   By: Jeronimo GreavesKyle  Talbot M.D.   On: 07/26/2018 20:18   Vas [place] Lower Extremity Venous (dvt)  Result Date: 08/04/2018  Lower Venous Study Indications: Edema.  Performing Technologist: Toma DeitersVirginia Slaughter RVS  Examination Guidelines: A complete evaluation includes B-mode imaging, spectral Doppler, color Doppler, and power Doppler as needed of all accessible portions of each vessel. Bilateral testing is considered an integral part of a complete examination. Limited examinations for reoccurring indications may be performed as noted.  Right Venous Findings: +---------+---------------+---------+-----------+----------+------------------+          CompressibilityPhasicitySpontaneityPropertiesSummary            +---------+---------------+---------+-----------+----------+------------------+ CFV      Full           Yes      Yes                                      +---------+---------------+---------+-----------+----------+------------------+ SFJ      Full                                                            +---------+---------------+---------+-----------+----------+------------------+ FV Prox  Full           Yes      Yes                                     +---------+---------------+---------+-----------+----------+------------------+ FV Mid   Full                                                            +---------+---------------+---------+-----------+----------+------------------+ FV DistalFull           Yes      Yes                                     +---------+---------------+---------+-----------+----------+------------------+ PFV      Full           Yes      Yes                                     +---------+---------------+---------+-----------+----------+------------------+  POP      Full           Yes      Yes                                     +---------+---------------+---------+-----------+----------+------------------+ PTV      Full                                                            +---------+---------------+---------+-----------+----------+------------------+ PERO                                                  Difficult to image +---------+---------------+---------+-----------+----------+------------------+  Left Venous Findings: +---------+---------------+---------+-----------+----------+------------------+          CompressibilityPhasicitySpontaneityPropertiesSummary            +---------+---------------+---------+-----------+----------+------------------+ CFV      Full           Yes      Yes                                     +---------+---------------+---------+-----------+----------+------------------+ SFJ      Full                                                            +---------+---------------+---------+-----------+----------+------------------+ FV  Prox  Full           Yes      Yes                                     +---------+---------------+---------+-----------+----------+------------------+ FV Mid   Full                                                            +---------+---------------+---------+-----------+----------+------------------+ FV DistalFull           Yes      Yes                                     +---------+---------------+---------+-----------+----------+------------------+ PFV      Full           Yes      Yes                                     +---------+---------------+---------+-----------+----------+------------------+ POP      Full           Yes  Yes                                     +---------+---------------+---------+-----------+----------+------------------+ PTV      Full                                                            +---------+---------------+---------+-----------+----------+------------------+ PERO                                                  Difficult to image +---------+---------------+---------+-----------+----------+------------------+    Summary: Right: There is no evidence of deep vein thrombosis in the lower extremity. No cystic structure found in the popliteal fossa. Left: There is no evidence of deep vein thrombosis in the lower extremity. No cystic structure found in the popliteal fossa.  *See table(s) above for measurements and observations. Electronically signed by Lemar Livings MD on 08/04/2018 at 5:31:50 PM.    Final     Labs:  Basic Metabolic Panel: Recent Labs  Lab 08/20/18 0434  NA 138  K 3.9  CL 105  CO2 24  GLUCOSE 98  BUN 37*  CREATININE 1.07*  CALCIUM 8.8*    CBC: Recent Labs  Lab 08/24/18 0447  WBC 7.5  NEUTROABS PENDING  HGB 12.4  HCT 40.3  MCV 98.5  PLT 270    CBG: Recent Labs  Lab 08/19/18 0650 08/19/18 1130 08/19/18 1645 08/19/18 2200 08/20/18 0635  GLUCAP 84 109* 208* 148* 85   Family history.  Positive for diabetes mellitus as well as CAD. Denies cancer  Brief HPI:    Patient is a 68 year old right-handed female history of acute on chronic respiratory failure maintained on chronic oxygen at home with 3 L as well as hypertension, diastolic congestive heart failure, atrial fibrillation maintained on metoprolol, chronic aplastic anemia requiring multiple transfusions. Patient lives with son and [place]. She used a rolling walker prior to admission. Presented to [facility]/09/2018 with increasing shortness of breath requiring intubation as well as hemoglobin 4.8 and subsequently transfused. She was stabilized and transferred to [facility]17[facility]2018 for prolonged intubation underwent tracheostomy 06/28/2018 per Dr. Dillard Cannon. Patient was decannulated remaining on chronic oxygen as prior to admission. Initial concerns for possible need for gastrostomy feeding tube for nutritional support but due to anatomy noted moderate-sized hiatal hernia and gastric positioning behind the left lobe of the liver and transverse colon procedure could not be completed. She did have a nasogastric tube for short time his diet was later advanced. On 08/01/2018 increasing shortness of breath CT the chest showed bilateral lobar pulmonary emboli right greater than left and evidence of right heart strain. Patient was cleared to begin heparin transitioned to Coumadin. Cardiology services follow-up for noted heart strain Dr Algie Coffer and continue to monitor. Patient also with history of atrial fibrillation advised continue metoprolol cardiac rate controlled. Close monitoring of hemoglobin remaining stable latest INR 2.7. Therapy evaluations ongoing and patient was admitted for a comprehensive rehabilitation program.     Hospital Course: Kelsey Jensen was admitted to rehab 08/12/2018 for inpatient therapies to consist  of PT, ST and OT at least three hours five days a week. Past  admission physiatrist, therapy team and rehab RN have worked together to provide customized collaborative inpatient rehab. Pertaining to Mr. Mac's acute on chronic respiratory failure. He continued on chronic oxygen as prior to admission. Referral had been made for outpatient pulmonary follow-up. Taper of steroids is indicated. She remained on chronic Coumadin for pulmonary emboli with latest INR 2.1. She was to follow up with [facility] phone [phone] for monitoring of Coumadin as well as medical management. Her pain management consisted of Voltaren gel and Robaxin. Tracheostomy site healing nicely she has since been decannulated she would follow up with Dr. Dillard Cannon as needed. Chronic aplastic anemia multiple transfusions in the past lays hemoglobin stable 13.3. She exhibited no signs of fluid overload diuretic as directed. She denied any increasing shortness of breath. Mood stabilization with the use of Klonopin and good results she is participating fully with her therapies. Blood pressure heart rate controlled with Lopressor 75 mg every 8 hours. Steroid-induced hyperglycemia monitored closely improved as prednisone was tapered off.   Rehab course: During patient's stay in rehab weekly team conferences were held to monitor patient's progress, set goals and discuss barriers to discharge. At admission, patient required minimum moderate assistance for functional mobility as well as ADLs with noted limited endurance. She was minimum to moderate assist to take 13 steps with a rolling walker. Minimum to moderate assist for ADLs.  She  has had improvement in activity tolerance, balance, postural control as well as ability to compensate for deficits. He/She has had improvement in functional use RUE/LUE  and RLE/LLE as well as improvement in awareness.she could propel her wheelchair with supervision intermittent breaks. Participated and stair training ascending bilateral rails and  increased time descending backwards with bilateral rails. Ambulating up to 50 feet rolling walker contact guard assist. Patient completed bathing, dressing and grooming during sessions. Supervision for supine to sit with head of bed elevated. She was able to ambulate rolling walker to the bathroom. She could gather her belongings. She was able to complete donning pants  and gripper socks with minimal assistance and use of a sock aid with set up only. Patient overall with excellent progress family teaching completed and plan discharge to home.       Disposition:  dDscharged to home   Diet:Regular consistency  Special Instructions: Home health nurse to check INR on 08/27/2018 results to [facility] phone number [phone] fax number [phone]  Continue chronic oxygen as prior to admission  Medications at discharge 1.aspirin 81 mg by mouth daily 2. Lipitor 40 mg by mouth daily 3. Klonopin 0.5 mg by mouth 3 times a day 4. Voltaren gel 1% 4 times daily to affected area 5. Pepcid 20 mg by mouth twice a day 6. Ferrous sulfate 325 mg by mouth twice a day 7. Lasix 40 mg by mouth daily 8. Antivert 25 mg by mouth 3 times a day as needed dizziness 9. Robaxin 500 mg by mouth every 8 hours as needed muscle spasms 10. Lopressor 75 mg by mouth every 8 hours 11. Protonix 40 mg by mouth daily 12. Prednisone 2.5 mg by mouth daily taper to off 13. Coumadin 4 mg by mouth daily adjust as directed pending INR  Follow-up Information    Marcello Fennel, MD Follow up.   Specialty:  Physical Medicine and Rehabilitation Why:  office to call for appointment Contact information: [address] [place] [address] [phone]        Lewayne Bunting, MD Follow up.   Specialty:  Cardiology Why:  call for appointment Contact information: [address] [place] [address] [phone]           Signed: Mcarthur Rossetti Angiulli 08/24/2018, 5:30  AM Patient seen and examined by me on day of discharge. Maryla Morrow, MD, ABPMR

## 2018-08-24 NOTE — Progress Notes (Signed)
Physical Therapy Discharge Summary  Patient Details  Name: Kelsey Jensen MRN: [id number] Date of Birth: 1951-04-06  Today's Date: 08/24/2018 PT Individual Time: 1000-1110 PT Individual Time Calculation (min): 70 min    Patient has met 6 of 8 long term goals due to improved activity tolerance, improved balance, increased strength, increased range of motion, improved awareness and improved coordination.  Patient to discharge at an ambulatory level Supervision.   Patient's care partner is independent to provide the necessary physical assistance at discharge.   Reasons goals not met: Pt continues to require minA for LLE management due to increased L hip groin pain. Pt does however sleep in a recliner at baseline. Pt also requires minA for stairs due to LLE weakness and increased difficulty clearing steps. Pt was however able to ascend/descend x 4 steps with B rails at [facility] and per pt son who will be home with pt normally provides HHA on contralateral side of rail when performing stairs.   Recommendation:  Patient will benefit from ongoing skilled PT services in home health setting to continue to advance safe functional mobility, address ongoing impairments in balance, strength, endurance, and minimize fall risk.  Equipment: w/c  Reasons for discharge: treatment goals met  Patient/family agrees with progress made and goals achieved: Yes  PT Discharge Precautions/Restrictions Precautions Precautions: Fall Precaution Comments: O2 dependency Restrictions Weight Bearing Restrictions: No Vital Signs   Pain Pain Assessment Pain Scale: 0-10 Pain Score: 8  Pain Location: Groin Pain Orientation: Left Pain Descriptors / Indicators: Aching;Discomfort Pain Intervention(s): Emotional support;Ambulation/increased activity;Repositioned;Other (Comment)(premedicated by nsg) Vision/Perception  Vision - Assessment Eye Alignment: Within Functional Limits  Sensation Sensation Light Touch: Appears  Intact Stereognosis: Appears Intact Coordination Gross Motor Movements are Fluid and Coordinated: No(limited by strength deficits ) Fine Motor Movements are Fluid and Coordinated: No Motor  Motor Motor: Within Functional Limits  Mobility Bed Mobility Bed Mobility: Rolling Right;Rolling Left;Supine to Sit;Sit to Supine Rolling Right: Contact Guard/Touching assist Rolling Left: Contact Guard/Touching assist Supine to Sit: Contact Guard/Touching assist;Supervision/Verbal cueing Sit to Supine: Minimal Assistance - Patient > 75% Transfers Transfers: Stand to Sit;Sit to Stand Sit to Stand: Supervision/Verbal cueing Stand to Sit: Supervision/Verbal cueing Locomotion  Gait Ambulation: Yes Gait Assistance: Supervision/Verbal cueing Gait Distance (Feet): 100 Feet Assistive device: Rolling walker Gait Assistance Details: Verbal cues for precautions/safety Gait Assistance Details: forward flexed posture with decreased self selected gait speed.  Gait Gait: Yes Gait Pattern: Narrow base of support;Decreased step length - right;Decreased step length - left Stairs / Additional Locomotion Stairs: Yes Stairs Assistance: Minimal Assistance - Patient > 75% Stair Management Technique: One rail Left Number of Stairs: 4 Height of Stairs: 6 Wheelchair Mobility Wheelchair Mobility: Yes Wheelchair Assistance: Independent with Camera operator: Both upper extremities Wheelchair Parts Management: Supervision/cueing Distance: 168f  Trunk/Postural Assessment  Cervical Assessment Cervical Assessment: Exceptions to WFL(forward head) Thoracic Assessment Thoracic Assessment: Exceptions to WFL(rounded shoulders) Lumbar Assessment Lumbar Assessment: Exceptions to WFL(posterior pelvic tilt) Postural Control Postural Control: Deficits on evaluation  Balance Balance Balance Assessed: Yes Static Sitting Balance Static Sitting - Level of Assistance: 6: Modified independent  (Device/Increase time) Dynamic Sitting Balance Dynamic Sitting - Level of Assistance: 6: Modified independent (Device/Increase time) Static Standing Balance Static Standing - Level of Assistance: 5: Stand by assistance Dynamic Standing Balance Dynamic Standing - Level of Assistance: 5: Stand by assistance Extremity Assessment      RLE Assessment RLE Assessment: Exceptions to [facility] Comments: grossly 4/5 proximal to distal  LLE Assessment LLE Assessment: Exceptions to [facility] General Strength Comments: grossly 4-/5 proximal to distal   Netta Corrigan, PT, DPT 08/24/18  4:51 PM    Rosita DeChalus 08/24/2018, 12:20 PM

## 2018-08-24 NOTE — Progress Notes (Signed)
[facility] PHYSICAL MEDICINE & REHABILITATION PROGRESS NOTE  Subjective/Complaints: Patient seen sitting up in her chair this morning.  She states she slept well overnight.  She denies complaints.  She states she is looking forward to discharge tomorrow.  ROS: Denies CP, SOB, N/V/D  Objective: Vital Signs: Blood pressure 122/64, pulse 75, temperature 98.7 F (37.1 C), temperature source Oral, resp. rate 18, height 5\' 4"  (1.626 m), weight 93.1 kg, SpO2 100 %. No results found. Recent Labs    08/24/18 0447  WBC 7.5  HGB 12.4  HCT 40.3  PLT 270   No results for input(s): NA, K, CL, CO2, GLUCOSE, BUN, CREATININE, CALCIUM in the last 72 hours.  Physical Exam: BP 122/64 (BP Location: Right Leg)   Pulse 75   Temp 98.7 F (37.1 C) (Oral)   Resp 18   Ht 5\' 4"  (1.626 m)   Wt 93.1 kg   SpO2 100%   BMI 35.23 kg/m  Constitutional: No distress . Vital signs reviewed. HENT: Normocephalic.  Atraumatic. Eyes: EOMI. No discharge. Cardiovascular: RRR.  No JVD. Respiratory: Clear.  Normal effort.  + [place] GI: BS +. Non-distended. Musc: No edema or tenderness in extremities. Neuro: Alert and oriented. Motor: Bilateral upper extremities: 4+/5 proximal distal, stable Bilateral lower extremities: Hip flexion 4+/5, knee extension 4+/5, ankle dorsiflexion 4+/5, stable Skin:Trach site healed Psych: Normal mood and behavior.   Assessment/Plan: 1. Functional deficits secondary to debility which require 3+ hours per day of interdisciplinary therapy in a comprehensive inpatient rehab setting.  Physiatrist is providing close team supervision and 24 hour management of active medical problems listed below.  Physiatrist and rehab team continue to assess barriers to discharge/monitor patient progress toward functional and medical goals  Care Tool:  Bathing  Bathing activity did not occur: Refused Body parts bathed by patient: Right arm, Left arm, Chest, Abdomen, Front perineal area, Face   Body  parts bathed by helper: Buttocks, Right lower leg, Left lower leg Body parts n/a: Buttocks, Right upper leg, Left upper leg, Right lower leg, Left lower leg(did not attempt)   Bathing assist Assist Level: Supervision/Verbal cueing(seated)     Upper Body Dressing/Undressing Upper body dressing Upper body dressing/undressing activity did not occur (including orthotics): Refused What is the patient wearing?: Pull over shirt    Upper body assist Assist Level: Set up assist    Lower Body Dressing/Undressing Lower body dressing    Lower body dressing activity did not occur: Refused What is the patient wearing?: Pants     Lower body assist Assist for lower body dressing: Contact Guard/Touching assist     Toileting Toileting Toileting Activity did not occur (Clothing management and hygiene only): Safety/medical concerns  Toileting assist Assist for toileting: Supervision/Verbal cueing     Transfers Chair/bed transfer  Transfers assist  Chair/bed transfer activity did not occur: Safety/medical concerns  Chair/bed transfer assist level: Contact Guard/Touching assist     Locomotion Ambulation   Ambulation assist      Assist level: Contact Guard/Touching assist Assistive device: Walker-rolling Max distance: 45'   Walk 10 feet activity   Assist  Walk 10 feet activity did not occur: Safety/medical concerns  Assist level: Supervision/Verbal cueing Assistive device: Walker-rolling   Walk 50 feet activity   Assist Walk 50 feet with 2 turns activity did not occur: Safety/medical concerns  Assist level: Supervision/Verbal cueing Assistive device: Walker-rolling    Walk 150 feet activity   Assist Walk 150 feet activity did not occur: Safety/medical concerns  Walk 10 feet on uneven surface  activity   Assist Walk 10 feet on uneven surfaces activity did not occur: Safety/medical concerns         Wheelchair     Assist   Type of Wheelchair:  Manual    Wheelchair assist level: Independent Max wheelchair distance: 150    Wheelchair 50 feet with 2 turns activity    Assist        Assist Level: Independent   Wheelchair 150 feet activity     Assist Wheelchair 150 feet activity did not occur: Safety/medical concerns   Assist Level: Independent      Medical Problem List and Plan: 1.Debilitysecondary to acute on chronic respiratory failure. Patient not on prednisone prior to admission. Continue chronic oxygen 3 L as prior to admission. Patient does not have a routine pulmonologist and will need a referral  Continue CIR  Taper steroids reduced to 15mg  on 3/1, decreased again on 3/4, decreased again on 3/7, decreased again on 3/10  Plan for d/c tomorrow  Will see patient for transitional care management in 1-2 weeks post-discharge 2. Antithrombotics: -DVT/anticoagulation:Coumadin for pulmonary emboli  INR therapeutic on 3/9 -antiplatelet therapy: ASA 81mg  qd 3. Pain Management:Voltaren as directed.Tylenol as needed  Robaxin as needed started on 2/28 4. Mood:Provide emotional support -antipsychotic agents: Clonazepam 0.5 mg 3 times a day 5. Neuropsych: This patientiscapable of making decisions on herown behalf. 6. Skin/Wound Care:Routine skin checks 7. Fluids/Electrolytes/Nutrition:Routine in and out's  8. 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 Chronic aplastic anemia. Patient multiple transfusions in the past.   Hemoglobin 12.1 on 3/10  Labs ordered for tomorrow 10. Diastolic congestive heart failure. Lasix 40 mg daily. Monitor for any signs of fluid overload Filed Weights   08/12/18 1400 08/15/18 0529 08/18/18 0630  Weight: 92.9 kg 93 kg 93.1 kg   Stable on 3/10 11. Hypertension Vitals:   08/23/18 2139 08/24/18 0514  BP: 117/77 122/64  Pulse: 70 75  Resp: 18 18  Temp:  98.7 F (37.1 C)  SpO2: 98%  100%    Reduced amlodipine to 2.5 on 3/1, DC'd on 3/6  Cont current dose metoprolol 75mg  TID   Relatively controlled on 3/10 12. Constipation. Laxative assistance 13.  Steroid-induced hyperglycemia  Improving 14. Congestion  Mucinex started on 3/2  Improved 15.  Bowel urgency  Multifactorial with physiological component-happening only after eating breakfast in the mornings  Encourage timed toileting in the a.m.  Improving 16.  AKI  Creatinine 1.07 on 3/6  Encourage fluids  LOS: 12 days A FACE TO FACE EVALUATION WAS PERFORMED  Lakai Moree Karis Juba 08/24/2018, 8:35 AM

## 2018-08-24 NOTE — Progress Notes (Signed)
Occupational Therapy Discharge Summary  Patient Details  Name: Kelsey Jensen MRN: [id number] Date of Birth: February 10, 1951  Today's Date: 08/24/2018 OT Individual Time: 8756-4332 OT Individual Time Calculation (min): 68 min    Session Note:  Pt propelled her wheelchair down to the ADL kitchen with modified independence and increased time.  Once in the kitchen therapist provided education on kitchen safety and energy conservation strategies for use with self care tasks.   Pt then had to gather all items to simulate a cooking task with use of the RW for support.  Pt has walker basket for use at home as well as use of the flat surfaces and countertops.  She was able to complete with supervision including placing items in the dishwasher.  She did need min instructional cueing to not try and walk while holding something in her hand, but to instead place it in the walker bag or use the countertop.  Finished session with pt pushing herself back to the room in the wheelchair.  Call button and phone in reach with safety belt in place.    Patient has met 10 of 10 long term goals due to improved activity tolerance, improved balance and ability to compensate for deficits.  Patient to discharge at overall Supervision level.  Patient's care partner is independent to provide the necessary physical assistance at discharge.    Reasons goals not met: NA  Recommendation:  Patient will benefit from ongoing skilled OT services in home health setting to continue to advance functional skills in the area of BADL and Reduce care partner burden.  Pt will benefit from continued HHOT in order to increase endurance and balance in order to reach a modified independent level for selfcare tasks and functional tranfers.  She continues to need frequent rest breaks with standing activity and selfcare tasks at this time, but is tolerating more now than on admission.    Equipment: No equipment provided  Reasons for discharge:  treatment goals met and discharge from hospital  Patient/family agrees with progress made and goals achieved: Yes  OT Discharge Precautions/Restrictions  Precautions Precautions: Fall Precaution Comments: O2 dependency Restrictions Weight Bearing Restrictions: No   Vital Signs Therapy Vitals Temp: 98.1 F (36.7 C) Pulse Rate: 73 Resp: 19 BP: 91/75 Patient Position (if appropriate): Sitting Oxygen Therapy SpO2: 98 % O2 Device: Room Air Pain Pain Assessment Pain Scale: Faces Pain Score: 0-No pain Pain Location: Groin Pain Orientation: Left Pain Descriptors / Indicators: Aching;Discomfort Pain Intervention(s): Emotional support;Ambulation/increased activity;Repositioned;Other (Comment)(premedicated by nsg) ADL ADL Eating: Independent Where Assessed-Eating: Wheelchair Grooming: Independent Where Assessed-Grooming: Sitting at sink Upper Body Bathing: Setup Where Assessed-Upper Body Bathing: Sitting at sink Lower Body Bathing: Supervision/safety Where Assessed-Lower Body Bathing: Sitting at sink Upper Body Dressing: Supervision/safety Where Assessed-Upper Body Dressing: Sitting at sink Lower Body Dressing: Supervision/safety Where Assessed-Lower Body Dressing: Wheelchair Toileting: Supervision/safety Where Assessed-Toileting: Bedside Commode Toilet Transfer: Close supervision Toilet Transfer Method: Counselling psychologist: Bedside commode Tub/Shower Transfer: Close [place] Transfer Method: Ambulating Tub/Shower Equipment: Radio broadcast assistant Vision Baseline Vision/History: Cataracts;Wears glasses Wears Glasses: Reading only Patient Visual Report: No change from baseline(cataract in the right eye) Vision Assessment?: Yes Eye Alignment: Within Functional Limits Alignment/Gaze Preference: Within Defined Limits Perception  Perception: Within Functional Limits Praxis Praxis: Intact Cognition Overall Cognitive Status: Within Functional  Limits for tasks assessed Orientation Level: Oriented to person;Oriented to place;Oriented to time;Oriented to situation Memory: Appears intact Awareness: Appears intact Problem Solving: Appears intact Sensation Sensation Light Touch: Appears Intact  Hot/Cold: Appears Intact Proprioception: Appears Intact Stereognosis: Appears Intact Coordination Gross Motor Movements are Fluid and Coordinated: No Fine Motor Movements are Fluid and Coordinated: No Coordination and Movement Description: Decreased speed of BUE coordination secondary to shoulder weakness, but uses both functionally during selfcare tasks with supervision. Motor  Motor Motor: Within Functional Limits Motor - Discharge Observations: generalized weakness Mobility  Bed Mobility Bed Mobility: Rolling Right;Rolling Left;Supine to Sit;Sit to Supine Rolling Right: Contact Guard/Touching assist Rolling Left: Contact Guard/Touching assist Supine to Sit: Contact Guard/Touching assist;Supervision/Verbal cueing Sit to Supine: Minimal Assistance - Patient > 75% Transfers Sit to Stand: Supervision/Verbal cueing Stand to Sit: Supervision/Verbal cueing  Trunk/Postural Assessment  Cervical Assessment Cervical Assessment: Exceptions to WFL(forward head) Thoracic Assessment Thoracic Assessment: Exceptions to WFL(rounded thoracic region) Lumbar Assessment Lumbar Assessment: Exceptions to WFL(posterior pelvic tilt) Postural Control Postural Control: Deficits on evaluation  Balance Balance Balance Assessed: Yes Static Sitting Balance Static Sitting - Balance Support: Feet supported Static Sitting - Level of Assistance: 7: Independent Dynamic Sitting Balance Dynamic Sitting - Balance Support: During functional activity Dynamic Sitting - Level of Assistance: 6: Modified independent (Device/Increase time) Static Standing Balance Static Standing - Level of Assistance: 5: Stand by assistance Dynamic Standing Balance Dynamic Standing  - Balance Support: During functional activity Dynamic Standing - Level of Assistance: 5: Stand by assistance Extremity/Trunk Assessment RUE Assessment RUE Assessment: Exceptions to [facility] Passive Range of Motion (PROM) Comments: WFLS for all joints Active Range of Motion (AROM) Comments: Shoulder flexion 0-80 degrees secondary to history of arthritis General Strength Comments: Shoulder flexion 2+/5, all other joints 3+/5 throughout.  Noted arthritic changes in digits especially DIP joints LUE Assessment LUE Assessment: Exceptions to [facility] Passive Range of Motion (PROM) Comments: WFLS Active Range of Motion (AROM) Comments: shoulder flexion 0-80 degrees secondary to history of degenerative changes General Strength Comments: shoulder flexion strength 2+/5, all other joints 3+/5 with arthriitc changes in the dip joints and PIP joints of the hand   MCGUIRE,JAMES OTR/L 08/24/2018, 4:04 PM

## 2018-08-24 NOTE — Progress Notes (Signed)
Physical Therapy Session Note  Patient Details  Name: Kelsey Jensen MRN: [id number] Date of Birth: 07/07/1950  Today's Date: 08/24/2018 PT Individual Time: 0800-0900 PT Individual Time Calculation (min): 60 min   Short Term Goals: Week 2:  PT Short Term Goal 1 (Week 2): STG=LTG due to ELOS  Skilled Therapeutic Interventions/Progress Updates: Pt presented in w/c with nsg present agreeable to therapy. Pt requesting to wash face and comb hair prior to leaving room. Pt performed with set up. Pt then transported to ortho gym for time management and performed car transfer with supervision and increased time. Pt then transported to ADL apt and attempted bed mobility from standard bed. Pt required minA for BLE management and discussed set up at home and use of AD for LLE management. Pt returned to w/c and transported to rehab gym, performed gait on uneven surfaces, sitting and standing dynamic balance via ball toss, and picking up object off ground. Pt then ambulated 120f with RW close S and w/c follow. Pt noted to have increased fatigue after ambulation. Pt then propelled back to room remaining distance and performed bed mobility on bed without rails and use of gait belt as leg lifter. Pt continues to require minA for sit to supine bed mobility but was able to return to sitting with supervision. Pt performed stand pivot transfer back to w/c and left with call bell within reach and needs met.      Therapy Documentation Precautions:  Precautions Precautions: Fall Precaution Comments: O2 dependency Restrictions Weight Bearing Restrictions: No General:   Vital Signs: Therapy Vitals Temp: 98.1 F (36.7 C) Pulse Rate: 73 Resp: 19 BP: 91/75 Patient Position (if appropriate): Sitting Oxygen Therapy SpO2: 98 % O2 Device: Room Air Pain: Pain Assessment Pain Scale: Faces Pain Score: 0-No pain Pain Location: Groin Pain Orientation: Left Pain Descriptors / Indicators: Aching;Discomfort Pain  Intervention(s): Emotional support;Ambulation/increased activity;Repositioned;Other (Comment)(premedicated by nsg) Mobility: Bed Mobility Bed Mobility: Rolling Right;Rolling Left;Supine to Sit;Sit to Supine Rolling Right: Contact Guard/Touching assist Rolling Left: Contact Guard/Touching assist Supine to Sit: Contact Guard/Touching assist;Supervision/Verbal cueing Sit to Supine: Minimal Assistance - Patient > 75% Transfers Transfers: Stand to Sit;Sit to Stand Sit to Stand: Supervision/Verbal cueing Stand to Sit: Supervision/Verbal cueing Locomotion : Gait Ambulation: Yes Gait Assistance: Supervision/Verbal cueing Gait Distance (Feet): 100 Feet Assistive device: Rolling walker Gait Assistance Details: Verbal cues for precautions/safety Gait Assistance Details: forward flexed posture with decreased self selected gait speed.  Gait Gait: Yes Gait Pattern: Narrow base of support;Decreased step length - right;Decreased step length - left Stairs / Additional Locomotion Stairs: Yes Stairs Assistance: Minimal Assistance - Patient > 75% Stair Management Technique: One rail Left Number of Stairs: 4 Height of Stairs: 6 Wheelchair Mobility Wheelchair Mobility: Yes Wheelchair Assistance: Independent with aCamera operator Both upper extremities Wheelchair Parts Management: Supervision/cueing Distance: 155f Trunk/Postural Assessment : Cervical Assessment Cervical Assessment: Exceptions to WFL(forward head) Thoracic Assessment Thoracic Assessment: Exceptions to WFL(rounded shoulders) Lumbar Assessment Lumbar Assessment: Exceptions to WFL(posterior pelvic tilt) Postural Control Postural Control: Deficits on evaluation  Balance: Balance Balance Assessed: Yes Static Sitting Balance Static Sitting - Level of Assistance: 6: Modified independent (Device/Increase time) Dynamic Sitting Balance Dynamic Sitting - Level of Assistance: 6: Modified independent  (Device/Increase time) Static Standing Balance Static Standing - Level of Assistance: 5: Stand by assistance Dynamic Standing Balance Dynamic Standing - Level of Assistance: 5: Stand by assistance Exercises:   Other Treatments:      Therapy/Group: Individual Therapy  Maribel Hadley  08/24/2018, 3:54 PM

## 2018-08-25 LAB — PROTIME-INR
INR: 1.9 — ABNORMAL HIGH (ref 0.8–1.2)
Prothrombin Time: 21.4 seconds — ABNORMAL HIGH (ref 11.4–15.2)

## 2018-08-25 MED ORDER — MECLIZINE HCL 25 MG PO TABS: 25.0000 mg | ORAL_TABLET | Freq: Three times a day (TID) | ORAL | 0 refills | Status: AC | PRN

## 2018-08-25 MED ORDER — WARFARIN SODIUM 4 MG PO TABS: 4.0000 mg | ORAL_TABLET | Freq: Every day | ORAL | 0 refills | Status: AC

## 2018-08-25 MED ORDER — ATORVASTATIN CALCIUM 40 MG PO TABS: 40.0000 mg | ORAL_TABLET | Freq: Every day | ORAL | 0 refills | Status: AC

## 2018-08-25 MED ORDER — METOPROLOL TARTRATE 75 MG PO TABS: 75.0000 mg | ORAL_TABLET | Freq: Three times a day (TID) | ORAL | 1 refills | Status: AC

## 2018-08-25 MED ORDER — WARFARIN SODIUM 5 MG PO TABS: 5.0000 mg | ORAL_TABLET | Freq: Once | ORAL | Status: DC

## 2018-08-25 MED ORDER — PREDNISONE 2.5 MG PO TABS: 2.5000 mg | ORAL_TABLET | Freq: Every day | ORAL | 0 refills | Status: AC

## 2018-08-25 MED ORDER — DICLOFENAC SODIUM 1 % TD GEL: 2.0000 g | Freq: Four times a day (QID) | TRANSDERMAL | 1 refills | Status: AC

## 2018-08-25 MED ORDER — FUROSEMIDE 20 MG PO TABS: 40.0000 mg | ORAL_TABLET | Freq: Every day | ORAL | 0 refills | Status: AC

## 2018-08-25 MED ORDER — FAMOTIDINE 20 MG PO TABS: 20.0000 mg | ORAL_TABLET | Freq: Two times a day (BID) | ORAL | 0 refills | Status: AC

## 2018-08-25 MED ORDER — PANTOPRAZOLE SODIUM 40 MG PO TBEC: 40.0000 mg | DELAYED_RELEASE_TABLET | Freq: Every day | ORAL | 0 refills | Status: AC

## 2018-08-25 MED ORDER — METHOCARBAMOL 500 MG PO TABS: 500.0000 mg | ORAL_TABLET | Freq: Three times a day (TID) | ORAL | 0 refills | Status: AC | PRN

## 2018-08-25 MED ORDER — FERROUS FUMARATE 324 (106 FE) MG PO TABS: 1.0000 | ORAL_TABLET | Freq: Two times a day (BID) | ORAL | 0 refills | Status: AC

## 2018-08-25 MED ORDER — CLONAZEPAM 0.5 MG PO TABS: 0.5000 mg | ORAL_TABLET | Freq: Three times a day (TID) | ORAL | 0 refills | Status: AC

## 2018-08-25 MED ORDER — WARFARIN SODIUM 2 MG PO TABS: 4.0000 mg | ORAL_TABLET | Freq: Every day | ORAL | Status: DC

## 2018-08-25 MED FILL — DICLOFENAC SODIUM 1% GEL: 1 | 13 days supply | Qty: 100 | Fill #0

## 2018-08-25 MED FILL — ATORVASTATIN CALCIUM 40 MG: 40 | 30 days supply | Qty: 30 | Fill #0

## 2018-08-25 MED FILL — FUROSEMIDE 20 MG TAB: 20 | 15 days supply | Qty: 30 | Fill #0

## 2018-08-25 MED FILL — METHOCARBAMOL 500 MG TABLET: 500 | 10 days supply | Qty: 30 | Fill #0

## 2018-08-25 MED FILL — predniSONE 5 MG TABS: 5 | 2 days supply | Qty: 1 | Fill #0

## 2018-08-25 MED FILL — HEARTBURN RELIEF 20 MG TAB: 20 | 25 days supply | Qty: 50 | Fill #0

## 2018-08-25 MED FILL — PANTOPRAZOLE SOD DR 40 MG T: 40 | 30 days supply | Qty: 30 | Fill #0

## 2018-08-25 MED FILL — FERROUS GLUCONATE 324 MG TA: 324 (38 FE) | 30 days supply | Qty: 60 | Fill #0

## 2018-08-25 MED FILL — METOPROLOL TARTRATE 50 MG T: 50 | 30 days supply | Qty: 135 | Fill #0

## 2018-08-25 MED FILL — WARFARIN SODIUM 4 MG TABLET: 4 | 30 days supply | Qty: 30 | Fill #0

## 2018-08-25 MED FILL — clonazePAM 0.5 MG TABS: 0.5 | 20 days supply | Qty: 60 | Fill #0

## 2018-08-25 NOTE — Progress Notes (Signed)
Pt d/c home with personal belongings.

## 2018-08-25 NOTE — Progress Notes (Addendum)
ANTICOAGULATION CONSULT NOTE - Follow Up Consult  Pharmacy Consult for Warfarin Indication: pulmonary embolus/afib  Allergies  Allergen Reactions  . Sulfa Antibiotics Nausea And Vomiting    Patient Measurements: Height: 5\' 4"  (162.6 cm) Weight: 205 lb 4 oz (93.1 kg) IBW/kg (Calculated) : 54.7  Vital Signs: BP: 101/79 (03/11 0441) Pulse Rate: 77 (03/11 0441)  Labs: Recent Labs    08/23/18 0611 08/24/18 0447 08/25/18 0453  HGB  --  12.4  --   HCT  --  40.3  --   PLT  --  270  --   LABPROT 22.9*  --  21.4*  INR 2.1*  --  1.9*    Estimated Creatinine Clearance: 54.1 mL/min (A) (by C-G formula based on SCr of 1.07 mg/dL (H)).  Assessment: 68 year old female transferred from [facility] Started on warfarin 08/03/18 for PE. INR remains stable on her current regimen and within the therapeutic range. She was previously not on coumadin prior to the PE due to hx GIB and low hgb.  INR drops slightly to 1.9 today after being stable for several days. We will give a small dose increase then resume the stable 4mg  dose.   Previous dosing regimen as follows: 2/21 INR 1.10 -> 7.5 mg 2/22 INR 1.2 -> 7.5 mg 2/23 INR 1.52 - > 10 mg 2/24 INR 1.89 -> 10 mg 2/25 INR 2.8 -> 5 mg 2/26 INR 2.6 -> 5 mg 2/27 INR 2.7 2/28 INR 2.9 2/29 INR 3.0 3/1 INR 3.6  Goal of Therapy:  INR 2-3 Monitor platelets by anticoagulation protocol: Yes   Plan:  Coumadin 5mg  PO x 1 then Coumadin 4mg  PO qday INR TTS  Ulyses Southward, PharmD, BCIDP, AAHIVP, CPP Infectious Disease Pharmacist 08/25/2018 8:35 AM

## 2018-08-25 NOTE — Progress Notes (Signed)
Social Work  Discharge Note  The overall goal for the admission was met for:   Discharge location: Yes - home with son  Length of Stay: Yes - 13 days  Discharge activity level: Yes - supervision  Home/community participation: Yes  Services provided included: MD, RD, PT, OT, RN, TR, Pharmacy and [place]: Medicare and Private Insurance: [facility]  Follow-up services arranged: Home Health: Therapist, sports, PT, OT via [place], DME: 18x18 lightweight w/c, cushion via [place] and Patient/Family has no preference for HH/DME agencies  Comments (or additional information):  Contact info:  Pt's granddaughter, [name] @ [phone]  Patient/Family verbalized understanding of follow-up arrangements: Yes  Individual responsible for coordination of the follow-up plan: pt  Confirmed correct DME delivered: Lennart Pall 08/25/2018    Derico Mitton

## 2018-08-30 ENCOUNTER — Telehealth: Payer: Self-pay

## 2018-08-30 NOTE — Telephone Encounter (Signed)
Transitional Care call-granddaugther Barnett Abu    1. Are you/is patient experiencing any problems since coming home? No Are there any questions regarding any aspect of care? No 2. Are there any questions regarding medications administration/dosing? No Are meds being taken as prescribed?Yes  Patient should review meds with caller to confirm 3. Have there been any falls? No 4. Has Home Health been to the house and/or have they contacted you? Yes If not, have you tried to contact them? Can we help you contact them? 5. Are bowels and bladder emptying properly? Yes Are there any unexpected incontinence issues? No If applicable, is patient following bowel/bladder programs? 6. Any fevers, problems with breathing, unexpected pain? No 7. Are there any skin problems or new areas of breakdown? No 8. Has the patient/family member arranged specialty MD follow up (ie cardiology/neurology/renal/surgical/etc)? Yes  Can we help arrange? 9. Does the patient need any other services or support that we can help arrange? No 10. Are caregivers following through as expected in assisting the patient? Yes 11. Has the patient quit smoking, drinking alcohol, or using drugs as recommended? Yes-smoking and drugs but not alcohol-intake small amounts  Appointment time 8:20 arrive time 8:00 with Riley Lam on 09/06/2018 [address] [employer] [address]

## 2018-09-06 ENCOUNTER — Encounter: Payer: Medicare Other | Attending: Registered Nurse | Admitting: Registered Nurse

## 2018-09-08 ENCOUNTER — Inpatient Hospital Stay: Payer: Medicare Other | Admitting: Registered Nurse

## 2018-09-20 ENCOUNTER — Telehealth: Payer: Self-pay | Admitting: *Deleted

## 2018-09-20 NOTE — Telephone Encounter (Signed)
-----   Message from Parke Poisson, MD sent at 09/13/2018  3:55 PM EDT ----- Regarding: appt This is a Dr. Algie Coffer patient. Not sure how it ended up on my schedule but probably should have them schedule with their office unless they are closed. Then i'd be happy to do virtual visit.  Thanks, GA

## 2018-09-20 NOTE — Telephone Encounter (Signed)
Left message to call back - need schedule a virtual visit - video  For 10/06/18 w/ Dr Jacques Navy

## 2018-09-22 NOTE — Telephone Encounter (Signed)
Left a message to call back.

## 2018-09-30 ENCOUNTER — Telehealth: Payer: Self-pay | Admitting: *Deleted

## 2018-09-30 NOTE — Telephone Encounter (Signed)
LEFT MESSAGE TO CALL BACK - NEED TO SCHEDULE W/DR Jacques Navy APPT 4/22

## 2018-10-05 ENCOUNTER — Telehealth: Payer: Self-pay

## 2018-10-05 ENCOUNTER — Telehealth: Payer: Self-pay | Admitting: Internal Medicine

## 2018-10-05 NOTE — Telephone Encounter (Signed)
Called the patient's home number could not leave a voice message for the voicemail box is not set up.

## 2018-10-05 NOTE — Telephone Encounter (Signed)
Called son's mobile and could not leave a message for voice mailbox is full

## 2018-10-05 NOTE — Telephone Encounter (Signed)
LVM for pre reg °

## 2018-10-05 NOTE — Telephone Encounter (Signed)
LVM/Called x3 for pre reg °

## 2018-10-05 NOTE — Telephone Encounter (Signed)
Called Kelsey Jensen mobile number left a detailed message to call me back at the office at 8 AM to go over her appointment

## 2018-10-06 ENCOUNTER — Telehealth: Payer: Self-pay

## 2018-10-06 ENCOUNTER — Telehealth: Payer: Medicare Other | Admitting: Internal Medicine

## 2018-10-06 NOTE — Telephone Encounter (Signed)
Called all of the numbers listed for the patient. Left voice messages for the patient on her number and the number listed for the grand-daughter. The number listed for the son can not leave a voice message due to voicemail not being set up

## 2019-07-28 IMAGING — DX DG CHEST 1V PORT
1 series · 1 of 1 positions shown · non-contrast
Comparison: Portable exam 7041 hours compared to 07/26/2018

CLINICAL DATA: Oxygen desaturation

EXAM:
PORTABLE CHEST 1 VIEW

[chest ap]
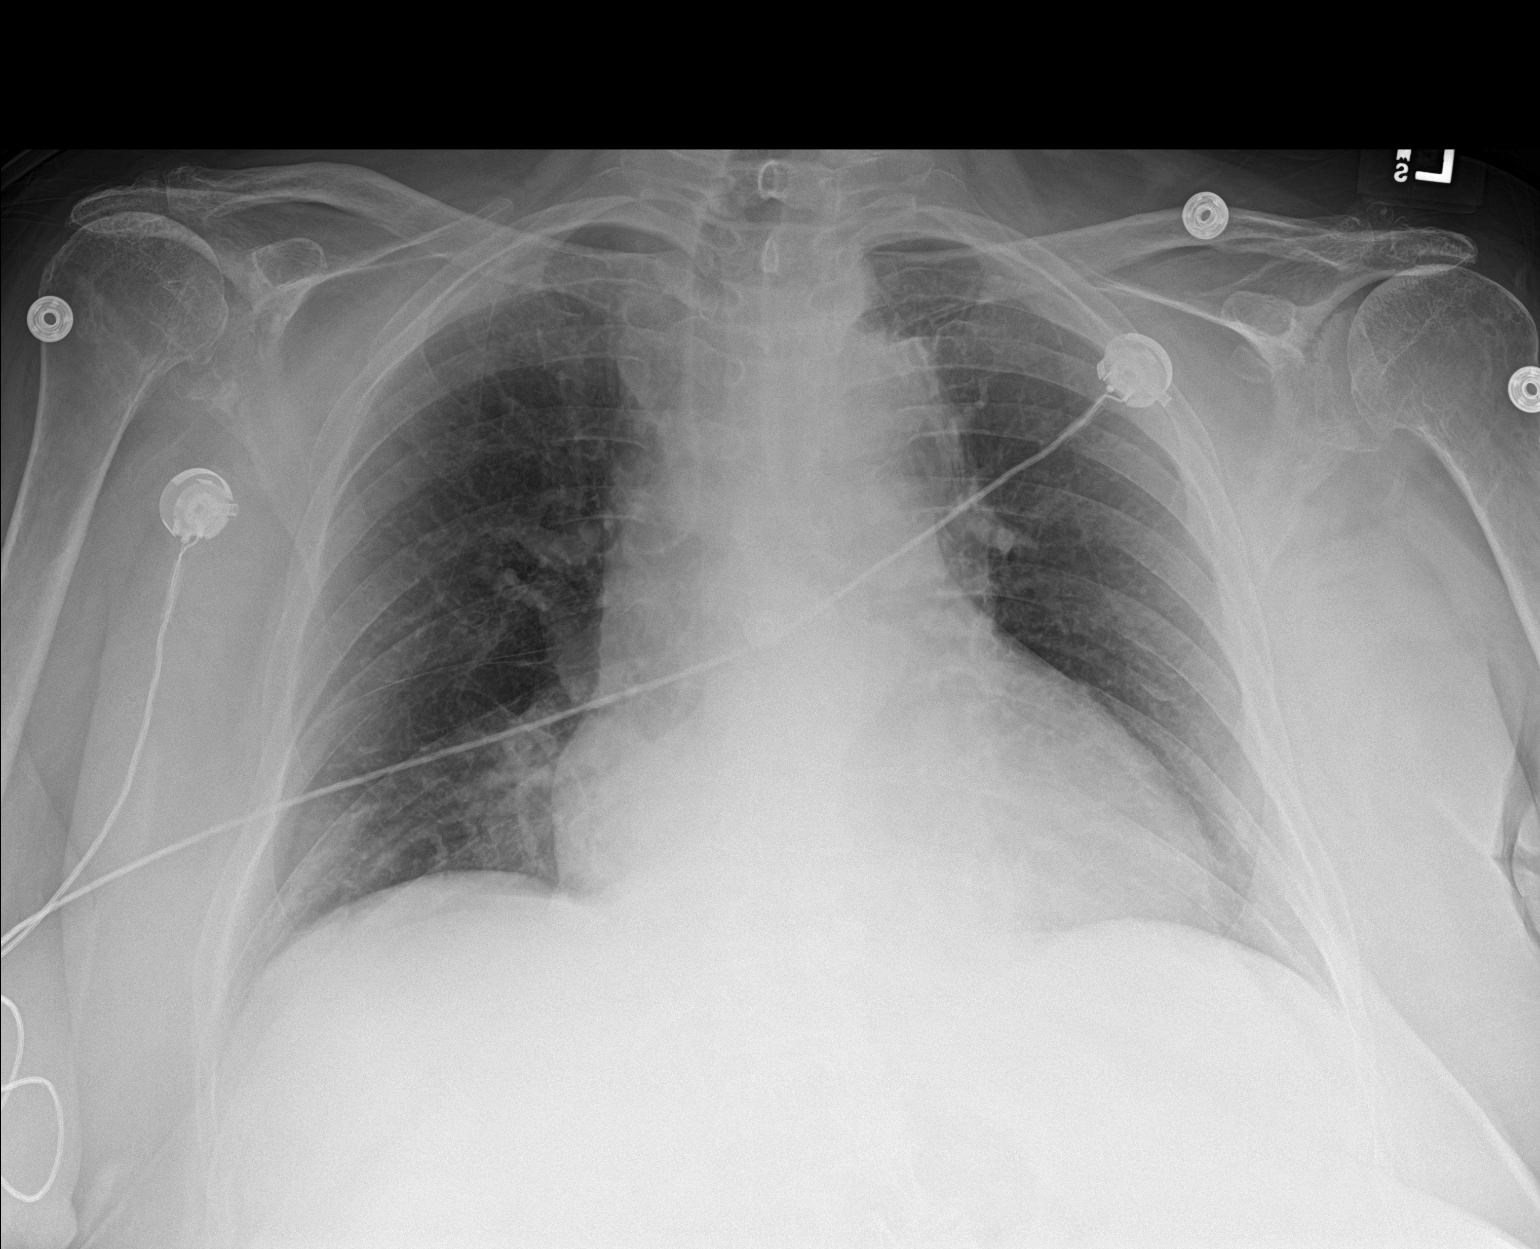

[1 of 1 positions shown; findings below may reference images not displayed]

FINDINGS: Enlargement of cardiac silhouette.

Atherosclerotic calcification aorta.

Mediastinal contours and pulmonary vascularity normal.

Streaky atelectasis at medial LEFT upper lobe.

Lungs otherwise clear.

No acute infiltrate, pleural effusion or pneumothorax.

Bones demineralized.
IMPRESSION: Enlargement of cardiac silhouette with streaky atelectasis in LEFT
upper lobe.

## 2019-07-31 IMAGING — CT CT CHEST W/ CM
2 of 4 series · 14 of 36 positions shown, 17 images · IV contrast (omnipaque)
Comparison: Chest x-ray July 29, 2018

Addendum:
CLINICAL DATA: Sepsis.

EXAM:
CT CHEST WITH CONTRAST
TECHNIQUE: Multidetector CT imaging of the chest was performed during
intravenous contrast administration.
CONTRAST:  75mL OMNIPAQUE IOHEXOL 300 MG/ML  SOLN

[Series 3: chest with 2mm st · axial · 0.84mm/px · z∈[+1179,+1421]mm · 11 of 143 slices shown, 14 images]
[im 11/143  mediastinal]
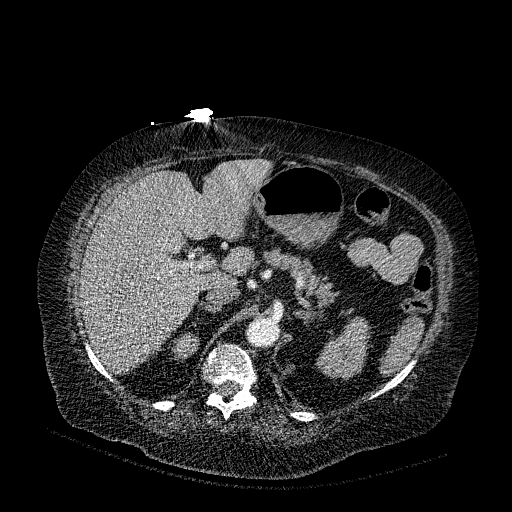
[im 11/143  lung]
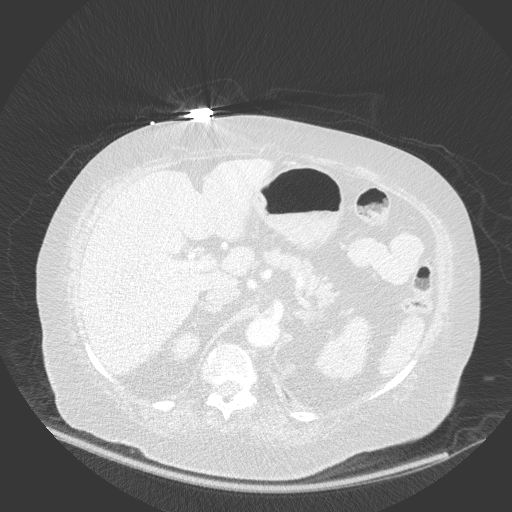
[im 21/143  lung]
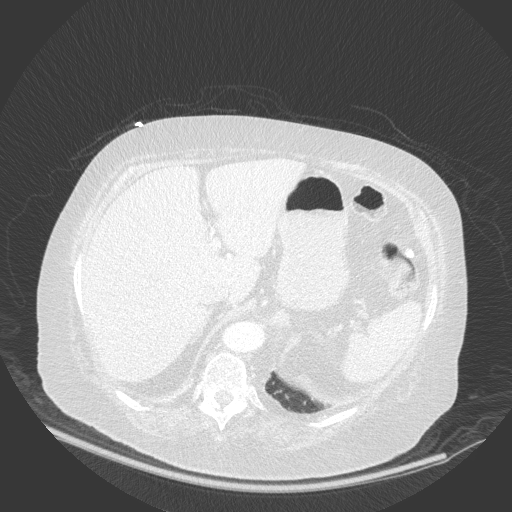
[im 31/143  lung]
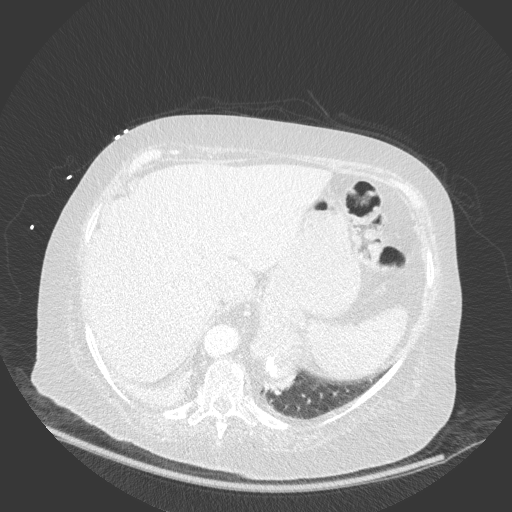
[im 51/143  lung]
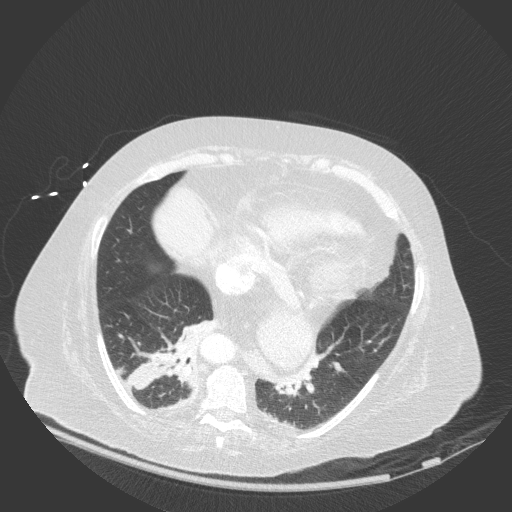
[im 61/143  mediastinal]
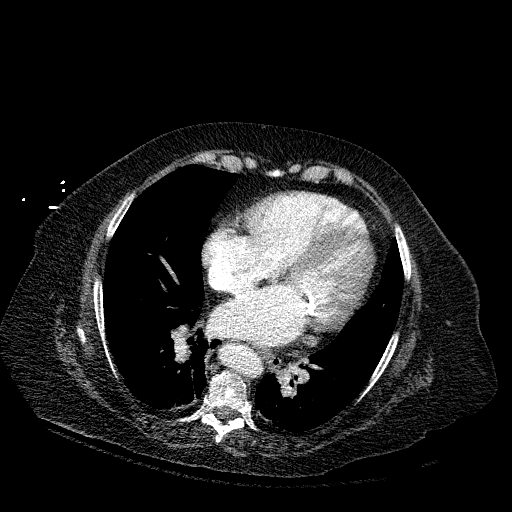
[im 61/143  lung]
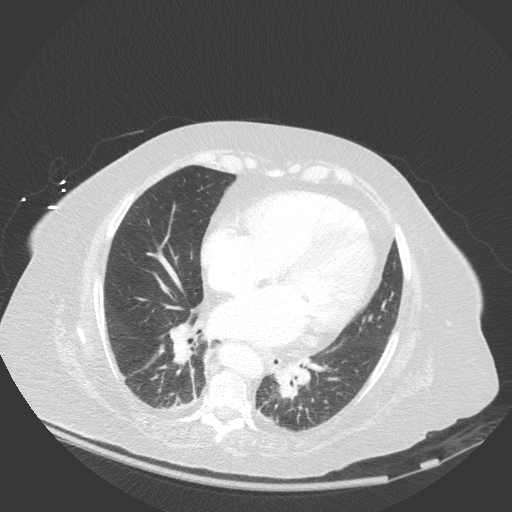
[im 72/143  lung]
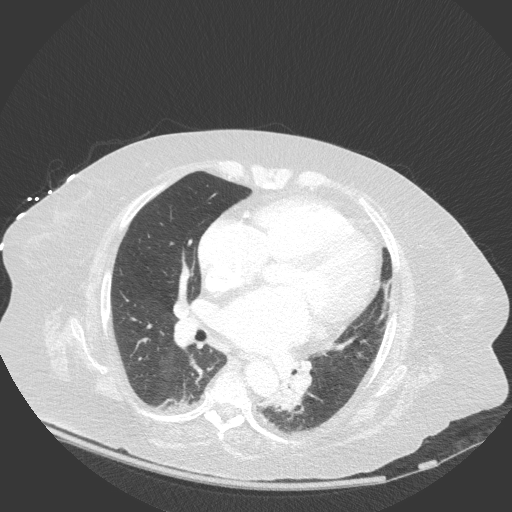
[im 82/143  lung]
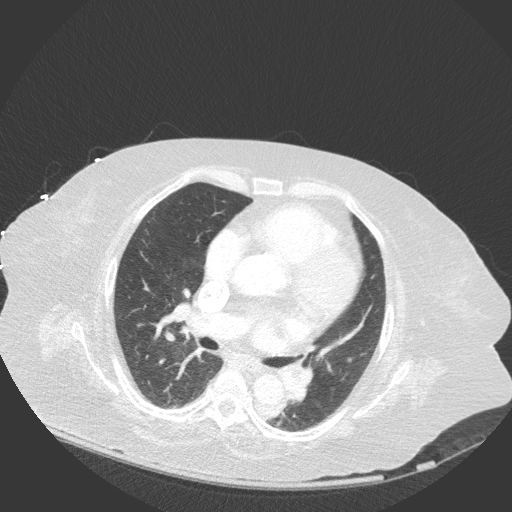
[im 92/143  lung]
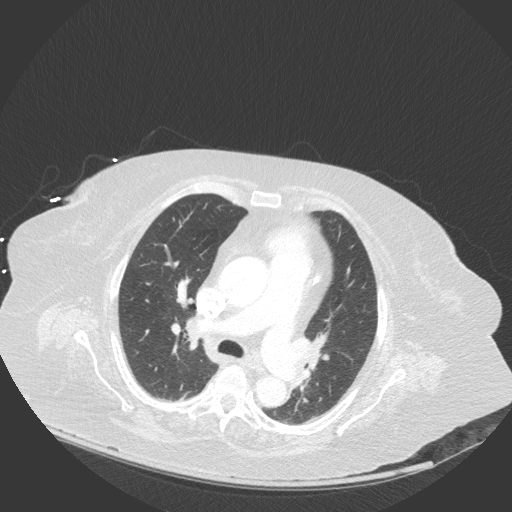
[im 112/143  mediastinal]
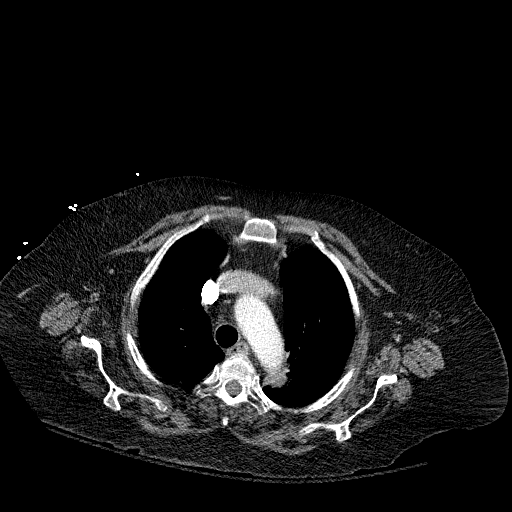
[im 112/143  lung]
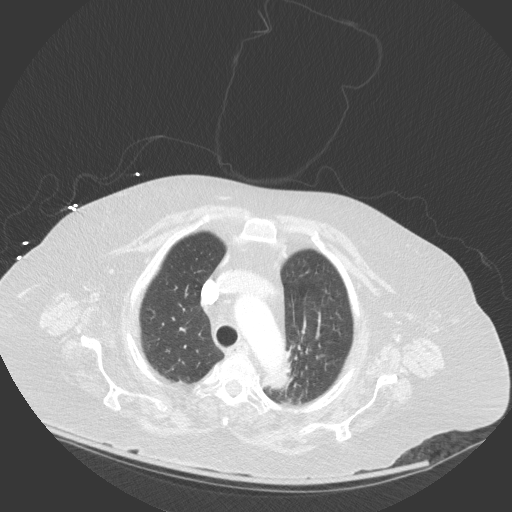
[im 122/143  lung]
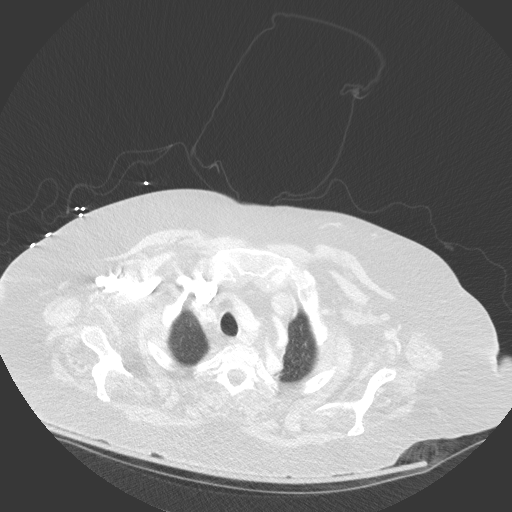
[im 132/143  lung]
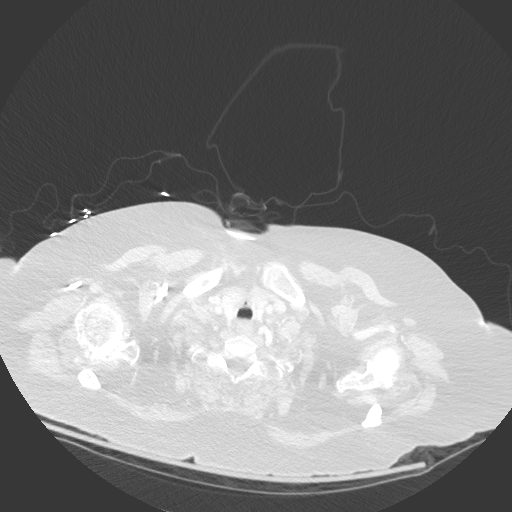

[Series 5: chest with 3mm st cor · coronal · 0.59mm/px · 3 of 101 slices shown]
[im 21/101  lung]
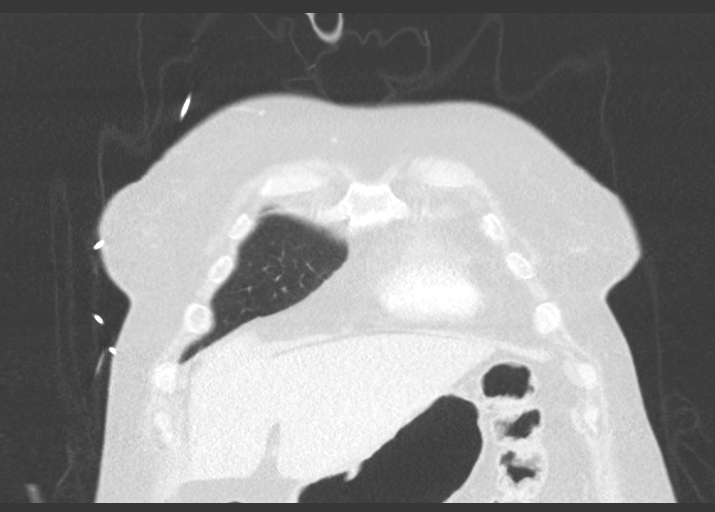
[im 41/101  lung]
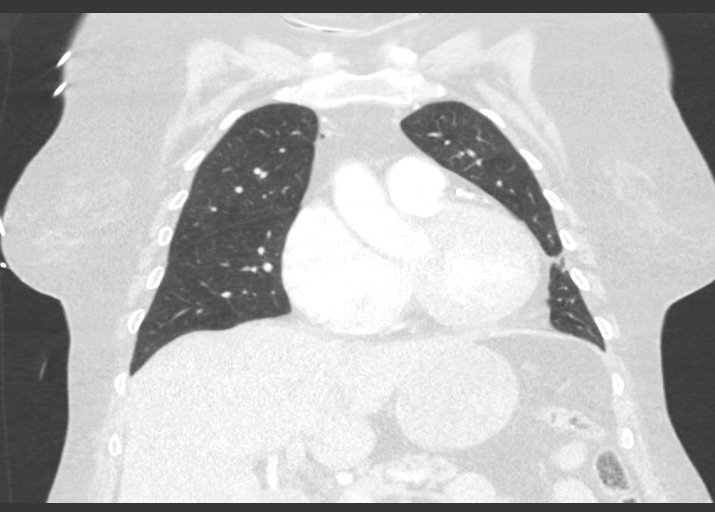
[im 61/101  lung]
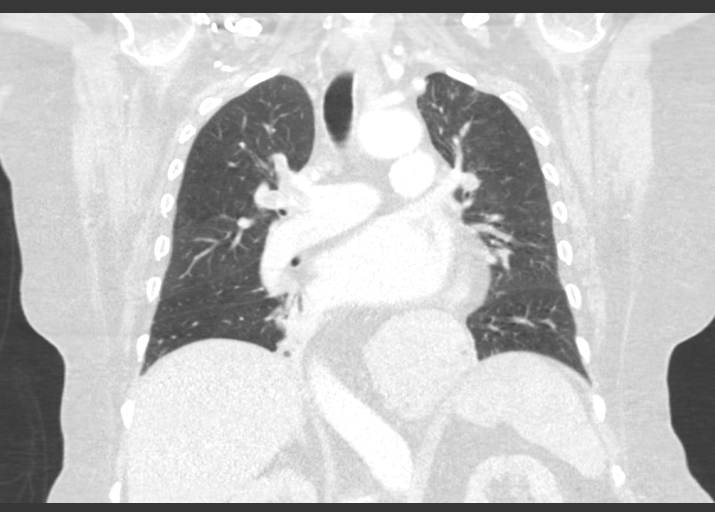

[14 of 36 positions shown; findings below may reference images not displayed]

FINDINGS: Cardiovascular: Cardiomegaly is noted. Coronary artery
calcifications are identified. The ascending thoracic aorta measures
4.1 cm in AP dimension. Mild atherosclerotic change. No dissection.
Pulmonary emboli involve the right upper, middle, and anterior lower
lobe branches. There is also involvement on the left but less than
seen on the right. The right ventricle measures 5.1 cm in diameter
in the left measures 3.8 cm. With a RV/LV ratio of 1.34.

Mediastinum/Nodes: There is a moderate hiatal hernia. The esophagus
and thyroid are otherwise normal. No adenopathy. No effusion.

Lungs/Pleura: Central airways are normal. No pneumothorax. There is
opacity in the medial right lung base with air bronchograms. There
is more mild opacity in the medial aspect of the left upper and
lower lobes. No nodules or masses.

Upper Abdomen: There is a small cyst in the right kidney. Upper
abdomen is otherwise unremarkable.

Musculoskeletal: No chest wall abnormality. No acute or significant
osseous findings.
IMPRESSION: 1. Bilateral lobar pulmonary emboli, right greater than left, with
an RV/LV ratio of 1.34 consistent with right heart strain. Positive
for acute PE with CT evidence of right heart strain (RV/LV Ratio =
1.34) consistent with at least submassive (intermediate risk) PE.
The presence of right heart strain has been associated with an
increased risk of morbidity and mortality. Please activate Code PE
by paging [phone].
2. Medial bibasilar opacities, right greater than left may represent
atelectasis. However, infiltrate/pneumonia not excluded on the
right. Infarct considered less likely on the right.
3. Cardiomegaly.  Coronary artery calcifications.
4. The ascending thoracic aorta is mildly aneurysmal measuring
cm.
5. Atherosclerotic change in the thoracic aorta.
6. Moderate hiatal hernia.

Findings being called to the referring clinical team.

Aortic Atherosclerosis ([id number]).

ADDENDUM:
The findings were called to Dr. Jim by myself shortly after the
study was performed.

*** End of Addendum ***

## 2019-08-08 IMAGING — DX DG CHEST 1V PORT
1 series · 1 of 1 positions shown · non-contrast
Comparison: Radiograph July 29, 2018.

CLINICAL DATA: Respiratory failure, hypoxia.

EXAM:
PORTABLE CHEST 1 VIEW

[chest]
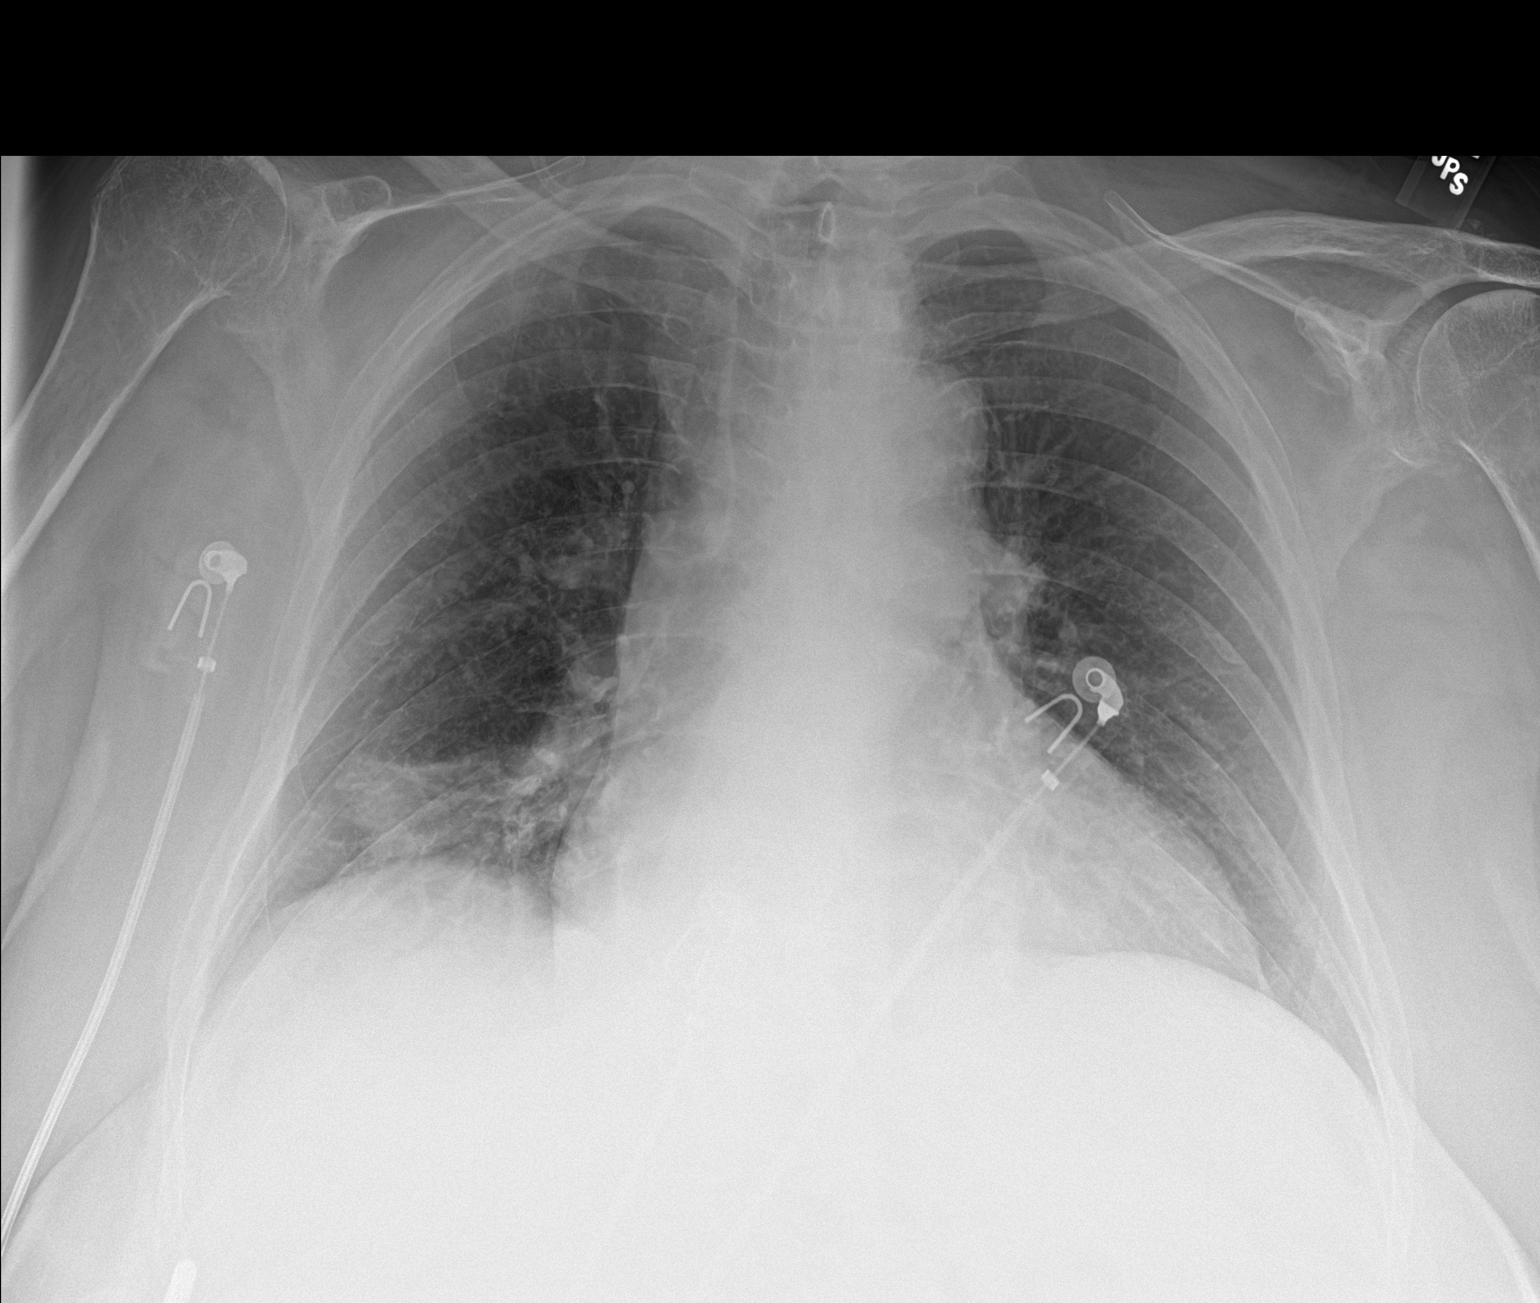

[1 of 1 positions shown; findings below may reference images not displayed]

FINDINGS: Stable cardiomegaly. No pneumothorax or pleural effusion is noted.
Mild right basilar opacity is noted concerning for pneumonia or
atelectasis. Bony thorax is unremarkable.
IMPRESSION: Mild right basilar pneumonia or atelectasis. Followup PA and lateral
chest X-ray is recommended in 3-4 weeks following trial of
antibiotic therapy to ensure resolution and exclude underlying
malignancy.

## 2019-11-15 DEATH — deceased
